# Patient Record
Sex: Female | Born: 1955
Health system: Southern US, Community
[De-identification: ages and names within clinical notes are randomized; demographics above are authoritative.]

## PROBLEM LIST (undated history)

## (undated) DIAGNOSIS — R7303 Prediabetes: Secondary | ICD-10-CM

## (undated) DIAGNOSIS — R112 Nausea with vomiting, unspecified: Secondary | ICD-10-CM

## (undated) DIAGNOSIS — T4145XA Adverse effect of unspecified anesthetic, initial encounter: Secondary | ICD-10-CM

## (undated) DIAGNOSIS — N6019 Diffuse cystic mastopathy of unspecified breast: Secondary | ICD-10-CM

## (undated) DIAGNOSIS — Z87442 Personal history of urinary calculi: Secondary | ICD-10-CM

## (undated) DIAGNOSIS — N200 Calculus of kidney: Secondary | ICD-10-CM

## (undated) DIAGNOSIS — Z86711 Personal history of pulmonary embolism: Secondary | ICD-10-CM

## (undated) DIAGNOSIS — T8859XA Other complications of anesthesia, initial encounter: Secondary | ICD-10-CM

## (undated) DIAGNOSIS — N83209 Unspecified ovarian cyst, unspecified side: Secondary | ICD-10-CM

## (undated) DIAGNOSIS — Z9889 Other specified postprocedural states: Secondary | ICD-10-CM

## (undated) DIAGNOSIS — I10 Essential (primary) hypertension: Secondary | ICD-10-CM

## (undated) HISTORY — PX: BREAST EXCISIONAL BIOPSY: SUR124

## (undated) HISTORY — DX: Calculus of kidney: N20.0

## (undated) HISTORY — DX: Diffuse cystic mastopathy of unspecified breast: N60.19

## (undated) HISTORY — DX: Unspecified ovarian cyst, unspecified side: N83.209

## (undated) HISTORY — PX: BREAST BIOPSY: SHX20

## (undated) HISTORY — PX: ABDOMINAL HYSTERECTOMY: SHX81

## (undated) HISTORY — DX: Personal history of pulmonary embolism: Z86.711

---
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
## 2005-02-24 ENCOUNTER — Ambulatory Visit: Payer: Self-pay

## 2005-03-09 ENCOUNTER — Ambulatory Visit: Payer: Self-pay

## 2007-01-13 ENCOUNTER — Ambulatory Visit: Payer: Self-pay | Admitting: Internal Medicine

## 2007-01-17 ENCOUNTER — Ambulatory Visit: Payer: Self-pay | Admitting: Internal Medicine

## 2007-04-24 LAB — HM DEXA SCAN: HM Dexa Scan: NORMAL

## 2007-05-18 HISTORY — PX: TOTAL ABDOMINAL HYSTERECTOMY W/ BILATERAL SALPINGOOPHORECTOMY: SHX83

## 2007-12-22 ENCOUNTER — Ambulatory Visit: Payer: Self-pay | Admitting: Internal Medicine

## 2008-01-16 ENCOUNTER — Ambulatory Visit: Payer: Self-pay | Admitting: Internal Medicine

## 2008-03-04 ENCOUNTER — Ambulatory Visit: Payer: Self-pay

## 2008-03-07 ENCOUNTER — Inpatient Hospital Stay: Payer: Self-pay

## 2008-03-17 DIAGNOSIS — I2699 Other pulmonary embolism without acute cor pulmonale: Secondary | ICD-10-CM

## 2008-03-17 DIAGNOSIS — Z86711 Personal history of pulmonary embolism: Secondary | ICD-10-CM

## 2008-03-17 HISTORY — DX: Personal history of pulmonary embolism: Z86.711

## 2008-03-17 HISTORY — DX: Other pulmonary embolism without acute cor pulmonale: I26.99

## 2008-03-28 ENCOUNTER — Inpatient Hospital Stay: Payer: Self-pay | Admitting: Internal Medicine

## 2008-04-04 ENCOUNTER — Ambulatory Visit: Payer: Self-pay | Admitting: Internal Medicine

## 2008-04-10 ENCOUNTER — Ambulatory Visit: Payer: Self-pay | Admitting: Internal Medicine

## 2008-04-24 ENCOUNTER — Ambulatory Visit: Payer: Self-pay | Admitting: Internal Medicine

## 2008-05-28 ENCOUNTER — Ambulatory Visit: Payer: Self-pay | Admitting: Internal Medicine

## 2008-06-11 ENCOUNTER — Ambulatory Visit: Payer: Self-pay | Admitting: Internal Medicine

## 2008-07-05 ENCOUNTER — Ambulatory Visit: Payer: Self-pay | Admitting: Internal Medicine

## 2008-09-12 ENCOUNTER — Ambulatory Visit: Payer: Self-pay | Admitting: Internal Medicine

## 2008-10-21 ENCOUNTER — Ambulatory Visit: Payer: Self-pay | Admitting: Internal Medicine

## 2008-11-01 ENCOUNTER — Ambulatory Visit: Payer: Self-pay | Admitting: Internal Medicine

## 2008-11-22 ENCOUNTER — Ambulatory Visit: Payer: Self-pay | Admitting: Internal Medicine

## 2009-03-12 ENCOUNTER — Ambulatory Visit: Payer: Self-pay

## 2009-04-03 ENCOUNTER — Ambulatory Visit: Payer: Self-pay | Admitting: Gastroenterology

## 2009-04-23 LAB — HM COLONOSCOPY: HM Colonoscopy: NORMAL

## 2010-03-17 ENCOUNTER — Ambulatory Visit: Payer: Self-pay | Admitting: Internal Medicine

## 2010-03-17 ENCOUNTER — Other Ambulatory Visit: Payer: Self-pay | Admitting: Internal Medicine

## 2010-03-26 ENCOUNTER — Other Ambulatory Visit: Payer: Self-pay | Admitting: Internal Medicine

## 2011-04-05 ENCOUNTER — Ambulatory Visit (INDEPENDENT_AMBULATORY_CARE_PROVIDER_SITE_OTHER): Payer: PRIVATE HEALTH INSURANCE | Admitting: Internal Medicine

## 2011-04-05 ENCOUNTER — Encounter: Payer: Self-pay | Admitting: Internal Medicine

## 2011-04-05 VITALS — BP 110/70 | HR 79 | Temp 98.3°F | Resp 16 | Ht 66.0 in | Wt 191.5 lb

## 2011-04-05 DIAGNOSIS — E663 Overweight: Secondary | ICD-10-CM

## 2011-04-05 DIAGNOSIS — E1169 Type 2 diabetes mellitus with other specified complication: Secondary | ICD-10-CM | POA: Insufficient documentation

## 2011-04-05 DIAGNOSIS — K635 Polyp of colon: Secondary | ICD-10-CM | POA: Insufficient documentation

## 2011-04-05 DIAGNOSIS — R7309 Other abnormal glucose: Secondary | ICD-10-CM

## 2011-04-05 DIAGNOSIS — Z1211 Encounter for screening for malignant neoplasm of colon: Secondary | ICD-10-CM | POA: Insufficient documentation

## 2011-04-05 DIAGNOSIS — Z86711 Personal history of pulmonary embolism: Secondary | ICD-10-CM | POA: Insufficient documentation

## 2011-04-05 DIAGNOSIS — E785 Hyperlipidemia, unspecified: Secondary | ICD-10-CM | POA: Insufficient documentation

## 2011-04-05 DIAGNOSIS — Z1239 Encounter for other screening for malignant neoplasm of breast: Secondary | ICD-10-CM

## 2011-04-05 DIAGNOSIS — E119 Type 2 diabetes mellitus without complications: Secondary | ICD-10-CM | POA: Insufficient documentation

## 2011-04-05 DIAGNOSIS — Z6825 Body mass index (BMI) 25.0-25.9, adult: Secondary | ICD-10-CM

## 2011-04-05 NOTE — Assessment & Plan Note (Signed)
Up to date, last scope 2010 by  Dr. Bluford Kaufmann

## 2011-04-05 NOTE — Assessment & Plan Note (Signed)
hgba1c was 6.1 with no priro history of abnormal  fasting glucose.  Spent 10 minutes discussing diagnosis of diabetes, prediabetes, etc.  recom dietary changes, continue exercise and check postprandial cbgs .

## 2011-04-05 NOTE — Assessment & Plan Note (Signed)
Breast exam done today, mammogram ordered

## 2011-04-05 NOTE — Patient Instructions (Signed)
Have the RN check your blood sugar 2 hours after lunch or breakfast ;  Goal is bs < 140  Try using the EAS Carb control protein shakes or the Atkins low carb protein bars for snacks  Cut out the fruit juices and regular sodas.   If you cholesterol is still elevated in 3 monhts we should consider medicatio to lower your LDL to < 100.  You will need repeat labs in 3 months.

## 2011-04-05 NOTE — Assessment & Plan Note (Signed)
LDL was 151 recentoly,  With hgba1c of 6.1 suggesting prediabetes.  Repeat in 6 months,  Goal < 100.

## 2011-04-05 NOTE — Progress Notes (Signed)
  Subjective:    Patient ID: Mary Chen, female    DOB: [birth year], 55 y.o.   MRN: [id number]  HPI    Review of Systems     Objective:   Physical Exam  Constitutional: She is oriented to person, place, and time. She appears well-developed and well-nourished.  HENT:  Mouth/Throat: Oropharynx is clear and moist.  Eyes: EOM are normal. Pupils are equal, round, and reactive to light. No scleral icterus.  Neck: Normal range of motion. Neck supple. No JVD present. No thyromegaly present.  Cardiovascular: Normal rate, regular rhythm, normal heart sounds and intact distal pulses.   Pulmonary/Chest: Effort normal and breath sounds normal.  Abdominal: Soft. Bowel sounds are normal. She exhibits no mass. There is no tenderness.  Genitourinary: Vagina normal. Pelvic exam was performed with patient supine. No vaginal discharge found.  Musculoskeletal: Normal range of motion. She exhibits no edema.  Lymphadenopathy:    She has no cervical adenopathy.  Neurological: She is alert and oriented to person, place, and time.  Skin: Skin is warm and dry.  Psychiatric: She has a normal mood and affect.          Assessment & Plan:

## 2011-04-06 ENCOUNTER — Ambulatory Visit: Payer: Self-pay | Admitting: Internal Medicine

## 2011-04-22 ENCOUNTER — Encounter: Payer: Self-pay | Admitting: Internal Medicine

## 2012-02-21 ENCOUNTER — Telehealth: Payer: Self-pay | Admitting: Internal Medicine

## 2012-02-21 DIAGNOSIS — Z1239 Encounter for other screening for malignant neoplasm of breast: Secondary | ICD-10-CM

## 2012-02-21 NOTE — Telephone Encounter (Signed)
Pt called needs mammogram order [place]

## 2012-02-28 NOTE — Telephone Encounter (Signed)
please check chart, it is already in there

## 2012-02-29 NOTE — Telephone Encounter (Signed)
Order faxed to [facility]

## 2012-05-08 ENCOUNTER — Ambulatory Visit: Payer: Self-pay | Admitting: Internal Medicine

## 2012-05-10 ENCOUNTER — Encounter: Payer: Self-pay | Admitting: Internal Medicine

## 2012-06-20 LAB — LIPID PANEL
LDL Cholesterol: 90 mg/dL
LDL Cholesterol: 97 mg/dL
Triglycerides: 48 mg/dL (ref 40–160)

## 2012-06-20 LAB — CBC AND DIFFERENTIAL: WBC: 4.9 10^3/mL

## 2012-06-21 ENCOUNTER — Encounter: Payer: Self-pay | Admitting: Internal Medicine

## 2012-06-21 ENCOUNTER — Ambulatory Visit (INDEPENDENT_AMBULATORY_CARE_PROVIDER_SITE_OTHER): Payer: 59 | Admitting: Internal Medicine

## 2012-06-21 ENCOUNTER — Other Ambulatory Visit: Payer: Self-pay | Admitting: Internal Medicine

## 2012-06-21 VITALS — BP 110/70 | HR 77 | Temp 97.7°F | Resp 16 | Ht 67.0 in | Wt 183.2 lb

## 2012-06-21 DIAGNOSIS — Z1239 Encounter for other screening for malignant neoplasm of breast: Secondary | ICD-10-CM

## 2012-06-21 DIAGNOSIS — Z1211 Encounter for screening for malignant neoplasm of colon: Secondary | ICD-10-CM

## 2012-06-21 DIAGNOSIS — Z0000 Encounter for general adult medical examination without abnormal findings: Secondary | ICD-10-CM | POA: Insufficient documentation

## 2012-06-21 DIAGNOSIS — Z0001 Encounter for general adult medical examination with abnormal findings: Secondary | ICD-10-CM | POA: Insufficient documentation

## 2012-06-21 LAB — COMPREHENSIVE METABOLIC PANEL
Anion Gap: 6 — ABNORMAL LOW (ref 7–16)
BUN: 16 mg/dL (ref 7–18)
Bilirubin,Total: 0.9 mg/dL (ref 0.2–1.0)
Chloride: 110 mmol/L — ABNORMAL HIGH (ref 98–107)
EGFR (African American): >60
EGFR (Non-African Amer.): >60
Glucose: 85 mg/dL (ref 65–99)
Osmolality: 284 (ref 275–301)
SGOT(AST): 21 U/L (ref 15–37)
SGPT (ALT): 22 U/L (ref 12–78)
Total Protein: 7.2 g/dL (ref 6.4–8.2)

## 2012-06-21 LAB — CBC WITH DIFFERENTIAL/PLATELET
Basophil #: 0.1 10*3/uL (ref 0.0–0.1)
Eosinophil #: 0 10*3/uL (ref 0.0–0.7)
Lymphocyte #: 2.5 10*3/uL (ref 1.0–3.6)
MCHC: 31.9 g/dL — ABNORMAL LOW (ref 32.0–36.0)
MCV: 71 fL — ABNORMAL LOW (ref 80–100)
Monocyte #: 0.4 x10 3/mm (ref 0.2–0.9)
Monocyte %: 8.7 %
Neutrophil %: 39.2 %
RBC: 5.71 10*6/uL — ABNORMAL HIGH (ref 3.80–5.20)

## 2012-06-21 LAB — LIPID PANEL
HDL Cholesterol: 73 mg/dL — ABNORMAL HIGH (ref 40–60)
Ldl Cholesterol, Calc: 97 mg/dL (ref 0–100)
Triglycerides: 48 mg/dL (ref 0–200)
VLDL Cholesterol, Calc: 10 mg/dL (ref 5–40)

## 2012-06-21 LAB — TSH: Thyroid Stimulating Horm: 0.926 u[IU]/mL

## 2012-06-21 NOTE — Progress Notes (Addendum)
Patient ID: Genese Quebedeaux, female   DOB: Jan 27, 1956, 57 y.o.   MRN: [id number]  Subjective:     Shernita Rabinovich is a 57 y.o. female and is here for a comprehensive physical exam. The patient reports no problems.  Home safety : The patient has smoke detectors in the home. They wear seatbelts.  There are no firearms at home. There is no violence in the home.  There is no risks for hepatitis, STDs or HIV. There is no   history of blood transfusion. They have no travel history to infectious disease endemic areas of the world. The patient has seen their dentist in the last six month. They have seen their eye doctor in the last year. They admit to slight hearing difficulty with regard to whispered voices and some television programs.  They have deferred audiologic testing in the last year.  They do not  have excessive sun exposure. Discussed the need for sun protection: hats, long sleeves and use of sunscreen if there is significant sun exposure.  Diet: the importance of a healthy diet is discussed. They do have a healthy diet. The benefits of regular aerobic exercise were discussed. She walks 4 times per week ,  60 minutes.  Depression screen: there are no signs or vegative symptoms of depression- irritability, change in appetite, anhedonia, sadness/tearfullness. Cognitive assessment: the patient manages all their financial and personal affairs and is actively engaged. They could relate day,date,year and events; recalled 2/3 objects at 3 minutes; performed clock-face test normally.  The following portions of the patient's history were reviewed and updated as appropriate: allergies, current medications, past family history, past medical history,  past surgical history, past social history  and problem list.  Visual acuity was not assessed per patient preference since she has regular follow up with her ophthalmologist. Hearing and body mass index were assessed and reviewed.   During the course of the  visit the patient was educated and counseled about appropriate screening and preventive services including : fall prevention , diabetes screening, nutrition counseling, colorectal cancer screening, and recommended immunizations.    History   Social History  . Marital Status: Divorced    Spouse Name: N/A    Number of Children: N/A  . Years of Education: N/A   Occupational History  .      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 Smoking status: Never Smoker   . Smokeless tobacco: Never Used  . Alcohol Use: No  . Drug Use: No  . Sexually Active: Not on file   Other Topics Concern  . Not on file   Social History Narrative  . No narrative on file   Health Maintenance  Topic Date Due  . Pap Smear  03/15/1973  . Tetanus/tdap  03/15/1974  . Colonoscopy  03/15/2005  . Influenza Vaccine  01/16/2012  . Mammogram  05/08/2014    The following portions of the patient's history were reviewed and updated as appropriate: allergies, current medications, past family history, past medical history, past social history, past surgical history and problem list.  Review of Systems A comprehensive review of systems was negative.   Objective:   BP 110/70  Pulse 77  Temp 97.7 F (36.5 C) (Oral)  Resp 16  Ht 5\' 7"  (1.702 m)  Wt 183 lb 4 oz (83.122 kg)  BMI 28.70 kg/m2  SpO2 95%  General Appearance:    Alert, cooperative, no distress, appears stated age  Head:    Normocephalic, without  obvious abnormality, atraumatic  Eyes:    PERRL, conjunctiva/corneas clear, EOM's intact, fundi    benign, both eyes  Ears:    Normal TM's and external ear canals, both ears  Nose:   Nares normal, septum midline, mucosa normal, no drainage    or sinus tenderness  Throat:   Lips, mucosa, and tongue normal; teeth and gums normal  Neck:   Supple, symmetrical, trachea midline, no adenopathy;    thyroid:  no enlargement/tenderness/nodules; no carotid   bruit or JVD  Back:     Symmetric, no  curvature, ROM normal, no CVA tenderness  Lungs:     Clear to auscultation bilaterally, respirations unlabored  Chest Wall:    No tenderness or deformity   Heart:    Regular rate and rhythm, S1 and S2 normal, no murmur, rub   or gallop  Breast Exam:    No tenderness, masses, or nipple abnormality  Abdomen:     Soft, non-tender, bowel sounds active all four quadrants,    no masses, no organomegaly  Genitalia:    Uterus surgically absent. No masses or adnexal tenderness  Rectal:    Normal tone, normal prostate, no masses or tenderness;   guaiac negative stool  Extremities:   Extremities normal, atraumatic, no cyanosis or edema  Pulses:   2+ and symmetric all extremities  Skin:   Skin color, texture, turgor normal, no rashes or lesions  Lymph nodes:   Cervical, supraclavicular, and axillary nodes normal  Neurologic:   CNII-XII intact, normal strength, sensation and reflexes    throughout        Assessment:   Routine general medical examination at a health care facility Annual exam including breast and pelvic exam were done today.   Screening for colon cancer 2010 colonoscopy by Dr Bluford Kaufmann.   Screening for breast cancer Normal breast exam.  Mammograms up to date.    Updated Medication List No outpatient encounter prescriptions on file as of 06/21/2012.

## 2012-06-21 NOTE — Assessment & Plan Note (Signed)
Normal breast exam.  Mammograms up to date.

## 2012-06-21 NOTE — Patient Instructions (Addendum)
You are in fantastic shape!  Keep up the good work!  I am sending you to [facility] for your fasting labs

## 2012-06-21 NOTE — Assessment & Plan Note (Signed)
2010 colonoscopy by Dr Bluford Kaufmann.

## 2012-06-21 NOTE — Assessment & Plan Note (Signed)
Annual exam including breast and pelvic exam were done today.

## 2012-06-23 ENCOUNTER — Telehealth: Payer: Self-pay | Admitting: Internal Medicine

## 2012-06-23 ENCOUNTER — Encounter: Payer: Self-pay | Admitting: Internal Medicine

## 2012-06-23 DIAGNOSIS — R718 Other abnormality of red blood cells: Secondary | ICD-10-CM | POA: Insufficient documentation

## 2012-06-23 NOTE — Telephone Encounter (Signed)
Pt.notified

## 2012-06-23 NOTE — Telephone Encounter (Signed)
All labs normal,. Including thyroid function and cholesterol . total chol is 180,  HDL is 73 (excellent) and LDL 97 (excellent)  She is not anemic . But one of her hemoglobin tests suggests she may have iron deficiency or another condition that cuases very small red blood cells, called thalassemia.  I do not have any prior workup for that on record, so if she doesn't mind getting a couple of additional blood tests, I will fax over an order to the lab for iron studies

## 2012-07-10 ENCOUNTER — Encounter: Payer: Self-pay | Admitting: Internal Medicine

## 2013-04-23 ENCOUNTER — Ambulatory Visit (INDEPENDENT_AMBULATORY_CARE_PROVIDER_SITE_OTHER): Payer: 59 | Admitting: Internal Medicine

## 2013-04-23 ENCOUNTER — Encounter: Payer: Self-pay | Admitting: Internal Medicine

## 2013-04-23 VITALS — BP 122/70 | HR 71 | Temp 98.3°F | Resp 12 | Ht 66.5 in | Wt 186.8 lb

## 2013-04-23 DIAGNOSIS — Z0000 Encounter for general adult medical examination without abnormal findings: Secondary | ICD-10-CM

## 2013-04-23 DIAGNOSIS — Z9079 Acquired absence of other genital organ(s): Secondary | ICD-10-CM

## 2013-04-23 DIAGNOSIS — Z1239 Encounter for other screening for malignant neoplasm of breast: Secondary | ICD-10-CM

## 2013-04-23 DIAGNOSIS — E785 Hyperlipidemia, unspecified: Secondary | ICD-10-CM

## 2013-04-23 DIAGNOSIS — Z90710 Acquired absence of both cervix and uterus: Secondary | ICD-10-CM

## 2013-04-23 DIAGNOSIS — R718 Other abnormality of red blood cells: Secondary | ICD-10-CM

## 2013-04-23 LAB — COMPREHENSIVE METABOLIC PANEL
AST: 18 U/L (ref 0–37)
BUN: 18 mg/dL (ref 6–23)
Calcium: 9.4 mg/dL (ref 8.4–10.5)
Chloride: 106 mEq/L (ref 96–112)
Creatinine, Ser: 0.8 mg/dL (ref 0.4–1.2)
Glucose, Bld: 83 mg/dL (ref 70–99)

## 2013-04-23 LAB — CBC WITH DIFFERENTIAL/PLATELET
Basophils Relative: 0.5 % (ref 0.0–3.0)
Eosinophils Absolute: 0 10*3/uL (ref 0.0–0.7)
Eosinophils Relative: 0.8 % (ref 0.0–5.0)
Hemoglobin: 12.9 g/dL (ref 12.0–15.0)
Lymphocytes Relative: 44.2 % (ref 12.0–46.0)
MCHC: 31.6 g/dL (ref 30.0–36.0)
MCV: 70.8 fl — ABNORMAL LOW (ref 78.0–100.0)
Monocytes Absolute: 0.4 10*3/uL (ref 0.1–1.0)
Neutro Abs: 2.1 10*3/uL (ref 1.4–7.7)
RBC: 5.76 Mil/uL — ABNORMAL HIGH (ref 3.87–5.11)

## 2013-04-23 NOTE — Patient Instructions (Signed)
You had your annual  wellness exam today  We will schedule your mammogram after Dec 23rd at [facility],  And your DEXA scan soon.  Please use the stool kit to send [place] back a sample to test for blood.  This is your colon CA screening test.   You need to have a TDaP vaccine.  Please check with Employee Health to see if it is free at work.  We will contact you with the bloodwork results

## 2013-04-23 NOTE — Assessment & Plan Note (Signed)
Low risk given elevated HDL of 73 and LDL 97.

## 2013-04-23 NOTE — Assessment & Plan Note (Signed)
Annual comprehensive exam was done including breast, excluding pelvic and PAP smear. All screenings have been addressed .  

## 2013-04-23 NOTE — Progress Notes (Signed)
Patient ID: Mary Chen, female   DOB: 05/19/1955, 57 y.o.   MRN: [id number]   Subjective:     Mary Chen is a 57 y.o. female and is here for a comprehensive physical exam. The patient reports no problems.  History   Social History  . Marital Status: Divorced    Spouse Name: N/A    Number of Children: N/A  . Years of Education: N/A   Occupational History  .      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 Smoking status: Never Smoker   . Smokeless tobacco: Never Used  . Alcohol Use: No  . Drug Use: No  . Sexual Activity: Not on file   Other Topics Concern  . Not on file   Social History Narrative  . No narrative on file   Health Maintenance  Topic Date Due  . Pap Smear  03/15/1973  . Tetanus/tdap  03/15/1974  . Colonoscopy  03/15/2005  . Influenza Vaccine  12/15/2012  . Mammogram  05/08/2014    The following portions of the patient's history were reviewed and updated as appropriate: allergies, current medications, past family history, past medical history, past social history, past surgical history and problem list.  Review of Systems A comprehensive review of systems was negative.   Objective:  BP 122/70  Pulse 71  Temp(Src) 98.3 F (36.8 C) (Oral)  Resp 12  Ht 5' 6.5" (1.689 m)  Wt 186 lb 12 oz (84.709 kg)  BMI 29.69 kg/m2  SpO2 97%  General Appearance:    Alert, cooperative, no distress, appears stated age  Head:    Normocephalic, without obvious abnormality, atraumatic  Eyes:    PERRL, conjunctiva/corneas clear, EOM's intact, fundi    benign, both eyes  Ears:    Normal TM's and external ear canals, both ears  Nose:   Nares normal, septum midline, mucosa normal, no drainage    or sinus tenderness  Throat:   Lips, mucosa, and tongue normal; teeth and gums normal  Neck:   Supple, symmetrical, trachea midline, no adenopathy;    thyroid:  no enlargement/tenderness/nodules; no carotid   bruit or JVD  Back:     Symmetric, no curvature,  ROM normal, no CVA tenderness  Lungs:     Clear to auscultation bilaterally, respirations unlabored  Chest Wall:    No tenderness or deformity   Heart:    Regular rate and rhythm, S1 and S2 normal, no murmur, rub   or gallop  Breast Exam:    No tenderness, masses, or nipple abnormality  Abdomen:     Soft, non-tender, bowel sounds active all four quadrants,    no masses, no organomegaly     Extremities:   Extremities normal, atraumatic, no cyanosis or edema  Pulses:   2+ and symmetric all extremities  Skin:   Skin color, texture, turgor normal, no rashes or lesions  Lymph nodes:   Cervical, supraclavicular, and axillary nodes normal  Neurologic:   CNII-XII intact, normal strength, sensation and reflexes    throughout    Assessment and Plan:  Routine general medical examination at a health care facility Annual comprehensive exam was done including breast, excluding  pelvic and PAP smear. All screenings have been addressed .   Microcytosis Checking iron , hgb electrophoresis and peripheral blood smear    Hyperlipidemia LDL goal < 100 Low risk given elevated HDL of 73 and LDL 97.    Updated Medication List No outpatient encounter prescriptions on  file as of 04/23/2013.

## 2013-04-23 NOTE — Assessment & Plan Note (Signed)
Checking iron , hgb electrophoresis and peripheral blood smear

## 2013-04-25 LAB — PROTEIN ELECTROPHORESIS, SERUM
Beta 2: 5.6 % (ref 3.2–6.5)
Gamma Globulin: 14.1 % (ref 11.1–18.8)

## 2013-05-02 ENCOUNTER — Encounter: Payer: Self-pay | Admitting: Emergency Medicine

## 2013-05-15 ENCOUNTER — Ambulatory Visit: Payer: Self-pay | Admitting: Internal Medicine

## 2013-05-21 ENCOUNTER — Telehealth: Payer: Self-pay | Admitting: Internal Medicine

## 2013-05-21 DIAGNOSIS — R928 Other abnormal and inconclusive findings on diagnostic imaging of breast: Secondary | ICD-10-CM

## 2013-05-21 NOTE — Telephone Encounter (Signed)
Her mammogram was abnormal on the right.  They saw an asymmetry and want additional images, which I have ordered.  I prefer to choose the surgeon with the patient if a biopsy is recommended, so if they tell her she needs a biopsy, she can tell them she is going to discuss it with me first.

## 2013-05-21 NOTE — Telephone Encounter (Signed)
Mary Chen, they will s/u added views but incase they call us about a biopsy. TT wants her to see a Careers advisersurgeon.

## 2013-05-21 NOTE — Assessment & Plan Note (Signed)
Her mammogram was abnormal on the right.  They saw an asymmetry and want additional images, which I have ordered.  I prefer to choose the surgeon with the patient if a biopsy is recommended, so if they tell her she needs a biopsy, she can tell them she is going to discuss it with me first.  

## 2013-06-18 ENCOUNTER — Encounter: Payer: Self-pay | Admitting: Internal Medicine

## 2013-06-18 ENCOUNTER — Ambulatory Visit: Payer: Self-pay | Admitting: Internal Medicine

## 2013-06-22 ENCOUNTER — Encounter: Payer: 59 | Admitting: Internal Medicine

## 2013-07-13 ENCOUNTER — Encounter: Payer: Self-pay | Admitting: Internal Medicine

## 2015-04-21 ENCOUNTER — Ambulatory Visit (INDEPENDENT_AMBULATORY_CARE_PROVIDER_SITE_OTHER): Payer: 59 | Admitting: Internal Medicine

## 2015-04-21 ENCOUNTER — Encounter: Payer: Self-pay | Admitting: Internal Medicine

## 2015-04-21 VITALS — BP 118/78 | HR 85 | Temp 98.4°F | Resp 12 | Ht 66.5 in | Wt 217.5 lb

## 2015-04-21 DIAGNOSIS — R718 Other abnormality of red blood cells: Secondary | ICD-10-CM

## 2015-04-21 DIAGNOSIS — E785 Hyperlipidemia, unspecified: Secondary | ICD-10-CM | POA: Diagnosis not present

## 2015-04-21 DIAGNOSIS — R7309 Other abnormal glucose: Secondary | ICD-10-CM | POA: Diagnosis not present

## 2015-04-21 DIAGNOSIS — Z1239 Encounter for other screening for malignant neoplasm of breast: Secondary | ICD-10-CM

## 2015-04-21 DIAGNOSIS — Z0000 Encounter for general adult medical examination without abnormal findings: Secondary | ICD-10-CM | POA: Diagnosis not present

## 2015-04-21 DIAGNOSIS — Z23 Encounter for immunization: Secondary | ICD-10-CM

## 2015-04-21 DIAGNOSIS — E559 Vitamin D deficiency, unspecified: Secondary | ICD-10-CM | POA: Diagnosis not present

## 2015-04-21 DIAGNOSIS — R635 Abnormal weight gain: Secondary | ICD-10-CM

## 2015-04-21 DIAGNOSIS — Z1159 Encounter for screening for other viral diseases: Secondary | ICD-10-CM

## 2015-04-21 NOTE — Progress Notes (Signed)
Pre-visit discussion using our clinic review tool. No additional management support is needed unless otherwise documented below in the visit note.  

## 2015-04-21 NOTE — Progress Notes (Addendum)
Patient ID: Mary Chen, female    DOB: July 06, 1955  Age: 59 y.o. MRN: [id number]  The patient is here for annual physical examination and management of other chronic and acute problems.  TAH/BSO 2014 No mammogram since Feb 2015  [facility]   Colonoscopy normal 2010    The risk factors are reflected in the social history.  The roster of all physicians providing medical care to patient - is listed in the Snapshot section of the chart.  Activities of daily living:  The patient is 100% independent in all ADLs: dressing, toileting, feeding as well as independent mobility  Home safety : The patient has smoke detectors in the home. They wear seatbelts.  There are no firearms at home. There is no violence in the home.   There is no risks for hepatitis, STDs or HIV. There is no   history of blood transfusion. They have no travel history to infectious disease endemic areas of the world.  The patient has seen their dentist in the last six month. They have seen their eye doctor in the last year. They admit to slight hearing difficulty with regard to whispered voices and some television programs.  They have deferred audiologic testing in the last year.  They do not  have excessive sun exposure. Discussed the need for sun protection: hats, long sleeves and use of sunscreen if there is significant sun exposure.   Diet: the importance of a healthy diet is discussed. They do have a healthy diet.  The benefits of regular aerobic exercise were discussed. She walks 4 times per week ,  20 minutes.   Depression screen: there are no signs or vegative symptoms of depression- irritability, change in appetite, anhedonia, sadness/tearfullness.  Cognitive assessment: the patient manages all their financial and personal affairs and is actively engaged. They could relate day,date,year and events; recalled 2/3 objects at 3 minutes; performed clock-face test normally.  The following portions of the  patient's history were reviewed and updated as appropriate: allergies, current medications, past family history, past medical history,  past surgical history, past social history  and problem list.  Visual acuity was not assessed per patient preference since she has regular follow up with her ophthalmologist. Hearing and body mass index were assessed and reviewed.   During the course of the visit the patient was educated and counseled about appropriate screening and preventive services including : fall prevention , diabetes screening, nutrition counseling, colorectal cancer screening, and recommended immunizations.    CC: The primary encounter diagnosis was Encounter for immunization. Diagnoses of Abnormal non-fasting glucose, Hyperlipidemia with target LDL less than 100, Microcytosis, Need for hepatitis C screening test, Screening for breast cancer, Weight gain, Vitamin D deficiency, Need for prophylactic vaccination with combined diphtheria-tetanus-pertussis (DTP) vaccine, and Visit for preventive health examination were also pertinent to this visit.  WEIGHT GAIN.  HAS NOT BEEN EXERCISING fo rhte past year due to work schedule .   History Mary Chen has a past medical history of Ovarian cyst; Fibrocystic disease of breast; pulmonary embolism (Nov 2009); and Benign colonic polyp (Nov 2010).   She has past surgical history that includes Total abdominal hysterectomy w/ bilateral salpingoophorectomy (2009) and Abdominal hysterectomy.   Her family history includes Bipolar disorder in her brother; Cancer in her mother; Cancer (age of onset: 8668) in her father.She reports that she has never smoked. She has never used smokeless tobacco. She reports that she does not drink alcohol or use illicit drugs.  No outpatient prescriptions prior to visit.   No facility-administered medications prior to visit.    Review of Systems   Patient denies headache, fevers, malaise, unintentional weight loss, skin rash,  eye pain, sinus congestion and sinus pain, sore throat, dysphagia,  hemoptysis , cough, dyspnea, wheezing, chest pain, palpitations, orthopnea, edema, abdominal pain, nausea, melena, diarrhea, constipation, flank pain, dysuria, hematuria, urinary  Frequency, nocturia, numbness, tingling, seizures,  Focal weakness, Loss of consciousness,  Tremor, insomnia, depression, anxiety, and suicidal ideation.      Objective:  BP 118/78 mmHg  Pulse 85  Temp(Src) 98.4 F (36.9 C) (Oral)  Resp 12  Ht 5' 6.5" (1.689 m)  Wt 217 lb 8 oz (98.657 kg)  BMI 34.58 kg/m2  SpO2 97%  Physical Exam   General appearance: alert, cooperative and appears stated age Head: Normocephalic, without obvious abnormality, atraumatic Eyes: conjunctivae/corneas clear. PERRL, EOM's intact. Fundi benign. Ears: normal TM's and external ear canals both ears Nose: Nares normal. Septum midline. Mucosa normal. No drainage or sinus tenderness. Throat: lips, mucosa, and tongue normal; teeth and gums normal Neck: no adenopathy, no carotid bruit, no JVD, supple, symmetrical, trachea midline and thyroid not enlarged, symmetric, no tenderness/mass/nodules Lungs: clear to auscultation bilaterally Breasts: normal appearance, no masses or tenderness Heart: regular rate and rhythm, S1, S2 normal, no murmur, click, rub or gallop Abdomen: soft, non-tender; bowel sounds normal; no masses,  no organomegaly Extremities: extremities normal, atraumatic, no cyanosis or edema Pulses: 2+ and symmetric Skin: Skin color, texture, turgor normal. No rashes or lesions Neurologic: Alert and oriented X 3, normal strength and tone. Normal symmetric reflexes. Normal coordination and gait.     Assessment & Plan:   Problem List Items Addressed This Visit    Vitamin D deficiency   Relevant Orders   VITAMIN D 25 Hydroxy (Vit-D Deficiency, Fractures) (Completed)   Hyperlipidemia with target LDL less than 100     Based on fasting cholesterol and your  concurrent history of hypertension , her 10 year risk of having some type of coronary event (including heart attack) is 6.7% .  No medication indicated at that time.          Lab Results  Component Value Date   CHOL 233* 04/21/2015   HDL 52.10 04/21/2015   LDLCALC 159* 04/21/2015   TRIG 111.0 04/21/2015   CHOLHDL 4 04/21/2015         Relevant Orders   Lipid panel (Completed)   Visit for preventive health examination    Annual comprehensive preventive exam was done as well as an evaluation and management of chronic conditions .  During the course of the visit the patient was educated and counseled about appropriate screening and preventive services including :  diabetes screening, lipid analysis with projected  10 year  risk for CAD  using the Framingham risk calculator for women, , nutrition counseling, colorectal cancer screening, and recommended immunizations.  Printed recommendations for health maintenance screenings was given.       Microcytosis    Chronic, normal SPEP.  Normal iron studies,  No anemia.   Lab Results  Component Value Date   WBC 5.8 04/21/2015   HGB 13.5 04/21/2015   HCT 43.4 04/21/2015   MCV 70.5* 04/21/2015   PLT 258.0 04/21/2015   Lab Results  Component Value Date   IRON 95 04/23/2013   TIBC 304 04/23/2013   FERRITIN 83.7 04/23/2013         Relevant Orders   CBC with Differential/Platelet (  Completed)   Screening for breast cancer   Relevant Orders   MM DIGITAL SCREENING BILATERAL   Abnormal non-fasting glucose   Relevant Orders   Comprehensive metabolic panel (Completed)   Hemoglobin A1c (Completed)    Other Visit Diagnoses    Encounter for immunization    -  Primary    Need for hepatitis C screening test        Relevant Orders    Hepatitis C antibody (Completed)    Weight gain        Relevant Orders    TSH (Completed)    Need for prophylactic vaccination with combined diphtheria-tetanus-pertussis (DTP) vaccine        Relevant Orders     Tdap vaccine greater than or equal to 7yo IM (Completed)       I am having Ms. Spoon start on ergocalciferol.  Meds ordered this encounter  Medications  . ergocalciferol (DRISDOL) 50000 UNITS capsule    Sig: Take 1 capsule (50,000 Units total) by mouth once a week.    Dispense:  12 capsule    Refill:  0    There are no discontinued medications.  Follow-up: No Follow-up on file.   Sherlene Shams, MD

## 2015-04-21 NOTE — Patient Instructions (Signed)

## 2015-04-22 LAB — CBC WITH DIFFERENTIAL/PLATELET
BASOS ABS: 0 10*3/uL (ref 0.0–0.1)
Basophils Relative: 0.4 % (ref 0.0–3.0)
EOS PCT: 1 % (ref 0.0–5.0)
Eosinophils Absolute: 0.1 10*3/uL (ref 0.0–0.7)
HEMATOCRIT: 43.4 % (ref 36.0–46.0)
HEMOGLOBIN: 13.5 g/dL (ref 12.0–15.0)
LYMPHS PCT: 46.4 % — AB (ref 12.0–46.0)
Lymphs Abs: 2.7 10*3/uL (ref 0.7–4.0)
MCHC: 31.1 g/dL (ref 30.0–36.0)
MCV: 70.5 fl — AB (ref 78.0–100.0)
MONOS PCT: 8 % (ref 3.0–12.0)
Monocytes Absolute: 0.5 10*3/uL (ref 0.1–1.0)
NEUTROS PCT: 44.2 % (ref 43.0–77.0)
Neutro Abs: 2.5 10*3/uL (ref 1.4–7.7)
Platelets: 258 10*3/uL (ref 150.0–400.0)
RBC: 6.16 Mil/uL — AB (ref 3.87–5.11)
RDW: 16.9 % — ABNORMAL HIGH (ref 11.5–15.5)
WBC: 5.8 10*3/uL (ref 4.0–10.5)

## 2015-04-22 LAB — COMPREHENSIVE METABOLIC PANEL
ALBUMIN: 4.1 g/dL (ref 3.5–5.2)
ALK PHOS: 84 U/L (ref 39–117)
ALT: 19 U/L (ref 0–35)
AST: 16 U/L (ref 0–37)
BILIRUBIN TOTAL: 1 mg/dL (ref 0.2–1.2)
BUN: 15 mg/dL (ref 6–23)
CO2: 26 mEq/L (ref 19–32)
CREATININE: 0.8 mg/dL (ref 0.40–1.20)
Calcium: 9.6 mg/dL (ref 8.4–10.5)
Chloride: 106 mEq/L (ref 96–112)
GFR: 94.06 mL/min (ref >60.00)
Glucose, Bld: 78 mg/dL (ref 70–99)
POTASSIUM: 4.6 meq/L (ref 3.5–5.1)
SODIUM: 143 meq/L (ref 135–145)
TOTAL PROTEIN: 7.2 g/dL (ref 6.0–8.3)

## 2015-04-22 LAB — HEMOGLOBIN A1C: Hgb A1c MFr Bld: 6.2 % (ref 4.6–6.5)

## 2015-04-22 LAB — HEPATITIS C ANTIBODY: HCV Ab: NEGATIVE

## 2015-04-22 LAB — LIPID PANEL
CHOL/HDL RATIO: 4
Cholesterol: 233 mg/dL — ABNORMAL HIGH (ref 0–200)
HDL: 52.1 mg/dL (ref >39.00)
LDL CALC: 159 mg/dL — AB (ref 0–99)
NONHDL: 181.13
Triglycerides: 111 mg/dL (ref 0.0–149.0)
VLDL: 22.2 mg/dL (ref 0.0–40.0)

## 2015-04-22 LAB — VITAMIN D 25 HYDROXY (VIT D DEFICIENCY, FRACTURES): VITD: 9.02 ng/mL — AB (ref 30.00–100.00)

## 2015-04-22 LAB — TSH: TSH: 1.03 u[IU]/mL (ref 0.35–4.50)

## 2015-04-22 NOTE — Assessment & Plan Note (Addendum)
Chronic, normal SPEP.  Normal iron studies,  No anemia.   Lab Results  Component Value Date   WBC 5.8 04/21/2015   HGB 13.5 04/21/2015   HCT 43.4 04/21/2015   MCV 70.5* 04/21/2015   PLT 258.0 04/21/2015   Lab Results  Component Value Date   IRON 95 04/23/2013   TIBC 304 04/23/2013   FERRITIN 83.7 04/23/2013

## 2015-04-22 NOTE — Assessment & Plan Note (Signed)
  Based on fasting cholesterol and your concurrent history of hypertension , her 10 year risk of having some type of coronary event (including heart attack) is 6.7% .  No medication indicated at that time.          Lab Results  Component Value Date   CHOL 233* 04/21/2015   HDL 52.10 04/21/2015   LDLCALC 159* 04/21/2015   TRIG 111.0 04/21/2015   CHOLHDL 4 04/21/2015

## 2015-04-22 NOTE — Assessment & Plan Note (Signed)

## 2015-04-24 DIAGNOSIS — E559 Vitamin D deficiency, unspecified: Secondary | ICD-10-CM | POA: Insufficient documentation

## 2015-04-24 MED ORDER — ERGOCALCIFEROL 1.25 MG (50000 UT) PO CAPS: 50000.0000 [IU] | ORAL_CAPSULE | ORAL | Status: DC

## 2015-04-24 NOTE — Addendum Note (Signed)
Addended by: Sherlene ShamsULLO, Jesaiah Fabiano L on: 04/24/2015 04:31 PM   Modules accepted: Orders, SmartSet

## 2015-04-25 ENCOUNTER — Encounter: Payer: Self-pay | Admitting: *Deleted

## 2015-07-16 ENCOUNTER — Encounter: Payer: Self-pay | Admitting: Physician Assistant

## 2015-07-16 ENCOUNTER — Ambulatory Visit: Payer: Self-pay | Admitting: Family

## 2015-07-16 VITALS — BP 140/80 | HR 80 | Temp 98.5°F

## 2015-07-16 DIAGNOSIS — Z299 Encounter for prophylactic measures, unspecified: Secondary | ICD-10-CM

## 2015-07-16 NOTE — Progress Notes (Signed)
See note in paper chart by Heather Ratcliffe, PAC  

## 2015-08-12 ENCOUNTER — Ambulatory Visit: Payer: Self-pay | Admitting: Physician Assistant

## 2015-08-12 ENCOUNTER — Encounter: Payer: Self-pay | Admitting: Physician Assistant

## 2015-08-12 VITALS — BP 120/84 | Temp 97.8°F

## 2015-08-12 DIAGNOSIS — E559 Vitamin D deficiency, unspecified: Secondary | ICD-10-CM

## 2015-08-12 MED ORDER — ERGOCALCIFEROL 1.25 MG (50000 UT) PO CAPS: 50000.0000 [IU] | ORAL_CAPSULE | ORAL | Status: DC

## 2015-08-12 NOTE — Progress Notes (Signed)
S: pt here for med refill on vit d 50,000u, regular md had rx'd it bc her levels were low, no complaints about medication, is not able to get much sun during the week, remainder ros neg  O: vitals wnl, nad, lungs c t a, cv rrr  A: vit d deficiency  P: refill on vit d, pt will f/u with pcp for further eval, ?if just needs otc at this point or needs to stay on rx strength, pt will have levels checked with pcp as vitamin d was 9 on labs from 04/21/15

## 2016-04-21 ENCOUNTER — Ambulatory Visit (INDEPENDENT_AMBULATORY_CARE_PROVIDER_SITE_OTHER): Payer: 59 | Admitting: Internal Medicine

## 2016-04-21 VITALS — BP 108/74 | HR 71 | Temp 98.2°F | Resp 12 | Ht 66.0 in | Wt 220.8 lb

## 2016-04-21 DIAGNOSIS — R7303 Prediabetes: Secondary | ICD-10-CM | POA: Diagnosis not present

## 2016-04-21 DIAGNOSIS — E785 Hyperlipidemia, unspecified: Secondary | ICD-10-CM | POA: Diagnosis not present

## 2016-04-21 DIAGNOSIS — E6609 Other obesity due to excess calories: Secondary | ICD-10-CM

## 2016-04-21 DIAGNOSIS — Z0000 Encounter for general adult medical examination without abnormal findings: Secondary | ICD-10-CM

## 2016-04-21 DIAGNOSIS — E559 Vitamin D deficiency, unspecified: Secondary | ICD-10-CM

## 2016-04-21 DIAGNOSIS — R5383 Other fatigue: Secondary | ICD-10-CM | POA: Diagnosis not present

## 2016-04-21 DIAGNOSIS — Z6831 Body mass index (BMI) 31.0-31.9, adult: Secondary | ICD-10-CM

## 2016-04-21 DIAGNOSIS — Z1231 Encounter for screening mammogram for malignant neoplasm of breast: Secondary | ICD-10-CM

## 2016-04-21 DIAGNOSIS — R7309 Other abnormal glucose: Secondary | ICD-10-CM

## 2016-04-21 DIAGNOSIS — E66811 Obesity, class 1: Secondary | ICD-10-CM

## 2016-04-21 DIAGNOSIS — Z1239 Encounter for other screening for malignant neoplasm of breast: Secondary | ICD-10-CM

## 2016-04-21 NOTE — Progress Notes (Signed)
Pre-visit discussion using our clinic review tool. No additional management support is needed unless otherwise documented below in the visit note.  

## 2016-04-21 NOTE — Patient Instructions (Signed)
The  diet I discussed with you today is the 10 day Green Smoothie Cleansing /Detox Diet by Linden Dolin . available on [employer] for around $10.  It does require a blender, (Vita Mix, a electric juicer,  Or a Nutribullet Rx).  This is not a low carb or a weight loss diet,  It is fundamentally a "cleansing" low fat diet that eliminates sugar, gluten, caffeine, alcohol and dairy for 10 days .  What you add back after the initial ten days is entirely up to  you!  You can expect to lose 5 to 10 lbs depending on how strict you are.   I found that  drinking 2 smoothies or juices  daily and keeping one chewable meal (but keep it simple, like baked fish and salad, rice or bok choy) kept me satisfied and kept me from straying  .  You snack primarily on fresh  fruit, egg whites and judicious quantities of nuts.  You can add a  vegetable based protein powder  to any smoothie made with almond milk (nothing with whey , since whey is dairy)  [employer] has a few but  the [employer] has the greatest  selection .  Using frozen fruits is much more convenient and cost effective. You can even find plenty of organic fruit in the frozen fruit section of [employer].  Just thaw what you need for the following day the night before in the refrigerator (to avoid jamming up your machine)     Health Maintenance for Postmenopausal Women Introduction Menopause is a normal process in which your reproductive ability comes to an end. This process happens gradually over a span of months to years, usually between the ages of 68 and 55. Menopause is complete when you have missed 12 consecutive menstrual periods. It is important to talk with your health care provider about some of the most common conditions that affect postmenopausal women, such as heart disease, cancer, and bone loss (osteoporosis). Adopting a healthy lifestyle and getting preventive care can help to promote your health and wellness. Those actions can also lower your chances of  developing some of these common conditions. What should I know about menopause? During menopause, you may experience a number of symptoms, such as:  Moderate-to-severe hot flashes.  Night sweats.  Decrease in sex drive.  Mood swings.  Headaches.  Tiredness.  Irritability.  Memory problems.  Insomnia. Choosing to treat or not to treat menopausal changes is an individual decision that you make with your health care provider. What should I know about hormone replacement therapy and supplements? Hormone therapy products are effective for treating symptoms that are associated with menopause, such as hot flashes and night sweats. Hormone replacement carries certain risks, especially as you become older. If you are thinking about using estrogen or estrogen with progestin treatments, discuss the benefits and risks with your health care provider. What should I know about heart disease and stroke? Heart disease, heart attack, and stroke become more likely as you age. This may be due, in part, to the hormonal changes that your body experiences during menopause. These can affect how your body processes dietary fats, triglycerides, and cholesterol. Heart attack and stroke are both medical emergencies. There are many things that you can do to help prevent heart disease and stroke:  Have your blood pressure checked at least every 1-2 years. High blood pressure causes heart disease and increases the risk of stroke.  If you are 94-31 years old, ask your health  care provider if you should take aspirin to prevent a heart attack or a stroke.  Do not use any tobacco products, including cigarettes, chewing tobacco, or electronic cigarettes. If you need help quitting, ask your health care provider.  It is important to eat a healthy diet and maintain a healthy weight.  Be sure to include plenty of vegetables, fruits, low-fat dairy products, and lean protein.  Avoid eating foods that are high in solid  fats, added sugars, or salt (sodium).  Get regular exercise. This is one of the most important things that you can do for your health.  Try to exercise for at least 150 minutes each week. The type of exercise that you do should increase your heart rate and make you sweat. This is known as moderate-intensity exercise.  Try to do strengthening exercises at least twice each week. Do these in addition to the moderate-intensity exercise.  Know your numbers.Ask your health care provider to check your cholesterol and your blood glucose. Continue to have your blood tested as directed by your health care provider. What should I know about cancer screening? There are several types of cancer. Take the following steps to reduce your risk and to catch any cancer development as early as possible. Breast Cancer  Practice breast self-awareness.  This means understanding how your breasts normally appear and feel.  It also means doing regular breast self-exams. Let your health care provider know about any changes, no matter how small.  If you are 65 or older, have a clinician do a breast exam (clinical breast exam or CBE) every year. Depending on your age, family history, and medical history, it may be recommended that you also have a yearly breast X-ray (mammogram).  If you have a family history of breast cancer, talk with your health care provider about genetic screening.  If you are at high risk for breast cancer, talk with your health care provider about having an MRI and a mammogram every year.  Breast cancer (BRCA) gene test is recommended for women who have family members with BRCA-related cancers. Results of the assessment will determine the need for genetic counseling and BRCA1 and for BRCA2 testing. BRCA-related cancers include these types:  Breast. This occurs in males or females.  Ovarian.  Tubal. This may also be called fallopian tube cancer.  Cancer of the abdominal or pelvic lining  (peritoneal cancer).  Prostate.  Pancreatic. Cervical, Uterine, and Ovarian Cancer  Your health care provider may recommend that you be screened regularly for cancer of the pelvic organs. These include your ovaries, uterus, and vagina. This screening involves a pelvic exam, which includes checking for microscopic changes to the surface of your cervix (Pap test).  For women ages 21-65, health care providers may recommend a pelvic exam and a Pap test every three years. For women ages 24-65, they may recommend the Pap test and pelvic exam, combined with testing for human papilloma virus (HPV), every five years. Some types of HPV increase your risk of cervical cancer. Testing for HPV may also be done on women of any age who have unclear Pap test results.  Other health care providers may not recommend any screening for nonpregnant women who are considered low risk for pelvic cancer and have no symptoms. Ask your health care provider if a screening pelvic exam is right for you.  If you have had past treatment for cervical cancer or a condition that could lead to cancer, you need Pap tests and screening for  cancer for at least 20 years after your treatment. If Pap tests have been discontinued for you, your risk factors (such as having a new sexual partner) need to be reassessed to determine if you should start having screenings again. Some women have medical problems that increase the chance of getting cervical cancer. In these cases, your health care provider may recommend that you have screening and Pap tests more often.  If you have a family history of uterine cancer or ovarian cancer, talk with your health care provider about genetic screening.  If you have vaginal bleeding after reaching menopause, tell your health care provider.  There are currently no reliable tests available to screen for ovarian cancer. Lung Cancer  Lung cancer screening is recommended for adults 51-76 years old who are at high  risk for lung cancer because of a history of smoking. A yearly low-dose CT scan of the lungs is recommended if you:  Currently smoke.  Have a history of at least 30 pack-years of smoking and you currently smoke or have quit within the past 15 years. A pack-year is smoking an average of one pack of cigarettes per day for one year. Yearly screening should:  Continue until it has been 15 years since you quit.  Stop if you develop a health problem that would prevent you from having lung cancer treatment. Colorectal Cancer  This type of cancer can be detected and can often be prevented.  Routine colorectal cancer screening usually begins at age 36 and continues through age 75.  If you have risk factors for colon cancer, your health care provider may recommend that you be screened at an earlier age.  If you have a family history of colorectal cancer, talk with your health care provider about genetic screening.  Your health care provider may also recommend using home test kits to check for hidden blood in your stool.  A small camera at the end of a tube can be used to examine your colon directly (sigmoidoscopy or colonoscopy). This is done to check for the earliest forms of colorectal cancer.  Direct examination of the colon should be repeated every 5-10 years until age 8. However, if early forms of precancerous polyps or small growths are found or if you have a family history or genetic risk for colorectal cancer, you may need to be screened more often. Skin Cancer  Check your skin from head to toe regularly.  Monitor any moles. Be sure to tell your health care provider:  About any new moles or changes in moles, especially if there is a change in a mole's shape or color.  If you have a mole that is larger than the size of a pencil eraser.  If any of your family members has a history of skin cancer, especially at a young age, talk with your health care provider about genetic  screening.  Always use sunscreen. Apply sunscreen liberally and repeatedly throughout the day.  Whenever you are outside, protect yourself by wearing long sleeves, pants, a wide-brimmed hat, and sunglasses. What should I know about osteoporosis? Osteoporosis is a condition in which bone destruction happens more quickly than new bone creation. After menopause, you may be at an increased risk for osteoporosis. To help prevent osteoporosis or the bone fractures that can happen because of osteoporosis, the following is recommended:  If you are 19-59 years old, get at least 1,000 mg of calcium and at least 600 mg of vitamin D per day.  If you  are older than age 82 but younger than age 49, get at least 1,200 mg of calcium and at least 600 mg of vitamin D per day.  If you are older than age 20, get at least 1,200 mg of calcium and at least 800 mg of vitamin D per day. Smoking and excessive alcohol intake increase the risk of osteoporosis. Eat foods that are rich in calcium and vitamin D, and do weight-bearing exercises several times each week as directed by your health care provider. What should I know about how menopause affects my mental health? Depression may occur at any age, but it is more common as you become older. Common symptoms of depression include:  Low or sad mood.  Changes in sleep patterns.  Changes in appetite or eating patterns.  Feeling an overall lack of motivation or enjoyment of activities that you previously enjoyed.  Frequent crying spells. Talk with your health care provider if you think that you are experiencing depression. What should I know about immunizations? It is important that you get and maintain your immunizations. These include:  Tetanus, diphtheria, and pertussis (Tdap) booster vaccine.  Influenza every year before the flu season begins.  Pneumonia vaccine.  Shingles vaccine. Your health care provider may also recommend other immunizations. This  information is not intended to replace advice given to you by your health care provider. Make sure you discuss any questions you have with your health care provider. Document Released: 06/25/2005 Document Revised: 11/21/2015 Document Reviewed: 02/04/2015  2017 [employer]vier

## 2016-04-21 NOTE — Progress Notes (Signed)
Patient ID: Mary Chen, female    DOB: [birth year]  Age: 60 y.o. MRN: [id number]  The patient is here for annual PHYSICAL examination and management of other chronic and acute problems.  LAST SEEN ONE YEAR AGO The risk factors are reflected in the social history.  The roster of all physicians providing medical care to patient - is listed in the Snapshot section of the chart.   Home safety : The patient has smoke detectors in the home. They wear seatbelts.  There are no firearms at home. There is no violence in the home.   There is no risks for hepatitis, STDs or HIV. There is no   history of blood transfusion. They have no travel history to infectious disease endemic areas of the world.  The patient has seen their dentist in the last six month. They have seen their eye doctor in the last year. They admit to slight hearing difficulty with regard to whispered voices and some television programs.  They have deferred audiologic testing in the last year.  They do not  have excessive sun exposure. Discussed the need for sun protection: hats, long sleeves and use of sunscreen if there is significant sun exposure.   Diet: the importance of a healthy diet is discussed. They donot  have a healthy diet.  The benefits of regular aerobic exercise were discussed. She is not exercising s.   Depression screen: there are no signs or vegative symptoms of depression- irritability, change in appetite, anhedonia, sadness/tearfullness.  The following portions of the patient's history were reviewed and updated as appropriate: allergies, current medications, past family history, past medical history,  past surgical history, past social history  and problem list.  Visual acuity was not assessed per patient preference since she has regular follow up with her ophthalmologist. Hearing and body mass index were assessed and reviewed.   During the course of the visit the patient was educated and counseled about  appropriate screening and preventive services including : fall prevention , diabetes screening, nutrition counseling, colorectal cancer screening, and recommended immunizations.    CC: The primary encounter diagnosis was Screening for breast cancer. Diagnoses of Hyperlipidemia with target LDL less than 100, Abnormal non-fasting glucose, Vitamin D deficiency, Fatigue, unspecified type, Hyperlipidemia LDL goal <160, Prediabetes, Class 1 obesity due to excess calories without serious comorbidity with body mass index (BMI) of 31.0 to 31.9 in adult, and Visit for preventive health examination were also pertinent to this visit.   1) Diagnosed with Prediabetes last year,  No follow up since then.    2) Obesity:  Has stopped exercising regularly and has gained 40 lbs since [facility] with [facility] .  Attributes the weight gain to increased work schedule loss of work/life balance.  Barriers to exercise explored:  5 YR OLD GRANDSON FOILING HER EFFORTS TO EXERCISE BC HE LIKES TO STAY WITH HER.     3) LOW VITAMIN D OF 9.  Not taking any supplements currently  4) INGUINAL RASH,  NOT RESPONDING  TO HYDROCORTISONE     History Mary Chen has a past medical history of Benign colonic polyp (Nov 2010); Fibrocystic disease of breast; pulmonary embolism (Nov 2009); and Ovarian cyst.   She has a past surgical history that includes Total abdominal hysterectomy w/ bilateral salpingoophorectomy (2009) and Abdominal hysterectomy.   Her family history includes Bipolar disorder in her brother; Cancer in her mother; Cancer (age of onset: 32) in her father.She reports that she has never smoked. She has  never used smokeless tobacco. She reports that she does not drink alcohol or use drugs.  Outpatient Medications Prior to Visit  Medication Sig Dispense Refill  . ergocalciferol (DRISDOL) 50000 units capsule Take 1 capsule (50,000 Units total) by mouth once a week. (Patient not taking: Reported on 04/21/2016) 12 capsule 0   No  facility-administered medications prior to visit.     Review of Systems   Patient denies headache, fevers, malaise, unintentional weight loss,, eye pain, sinus congestion and sinus pain, sore throat, dysphagia,  hemoptysis , cough, dyspnea, wheezing, chest pain, palpitations, orthopnea, edema, abdominal pain, nausea, melena, diarrhea, constipation, flank pain, dysuria, hematuria, urinary  Frequency, nocturia, numbness, tingling, seizures,  Focal weakness, Loss of consciousness,  Tremor, insomnia, depression, anxiety, and suicidal ideation.      Objective:  BP 108/74   Pulse 71   Temp 98.2 F (36.8 C) (Oral)   Resp 12   Ht 5\' 6"  (1.676 m)   Wt 220 lb 12 oz (100.1 kg)   SpO2 96%   BMI 35.63 kg/m   Physical Exam   General appearance: alert, cooperative and appears stated age Head: Normocephalic, without obvious abnormality, atraumatic Eyes: conjunctivae/corneas clear. PERRL, EOM's intact. Fundi benign. Ears: normal TM's and external ear canals both ears Nose: Nares normal. Septum midline. Mucosa normal. No drainage or sinus tenderness. Throat: lips, mucosa, and tongue normal; teeth and gums normal Neck: no adenopathy, no carotid bruit, no JVD, supple, symmetrical, trachea midline and thyroid not enlarged, symmetric, no tenderness/mass/nodules Lungs: clear to auscultation bilaterally Breasts: normal appearance, no masses or tenderness Heart: regular rate and rhythm, S1, S2 normal, no murmur, click, rub or gallop Abdomen: soft, non-tender; bowel sounds normal; no masses,  no organomegaly Extremities: extremities normal, atraumatic, no cyanosis or edema Pulses: 2+ and symmetric Skin: Skin color, texture, turgor normal mild inguinal rash with erythema noted no lesions or excoriations  Neurologic: Alert and oriented X 3, normal strength and tone. Normal symmetric reflexes. Normal coordination and gait.      Assessment & Plan:   Problem List Items Addressed This Visit     Hyperlipidemia LDL goal <160    Using the Framingham risk calculator,  her 10 year risk of coronary artery disease is 5% but increases to 10 % if she develops diabetes. .  Lab Results  Component Value Date   CHOL 244 (H) 04/21/2016   HDL 55.90 04/21/2016   LDLCALC 163 (H) 04/21/2016   LDLDIRECT 167.0 04/21/2016   TRIG 126.0 04/21/2016   CHOLHDL 4 04/21/2016         Obesity    I have addressed  BMI and recommended wt loss of 10% of body weigh over the next 6 months using a low glycemic index diet and regular exercise a minimum of 5 days per week.        Prediabetes    Her  random glucose is not  elevated but her A1c suggests she is at risk for developing diabetes.  I recommend he follow a low glycemic index diet and particpate regularly in an aerobic  exercise activity.  We should check an A1c in 6 months.   Lab Results  Component Value Date   HGBA1C 6.2 04/21/2016         Screening for breast cancer - Primary   Relevant Orders   MM SCREENING BREAST TOMO BILATERAL   Visit for preventive health examination    Annual comprehensive preventive exam was done as well as an evaluation and management  of chronic conditions .  During the course of the visit the patient was educated and counseled about appropriate screening and preventive services including :  diabetes screening, lipid analysis with projected  10 year  risk for CAD , nutrition counseling, breast, cervical and colorectal cancer screening, and recommended immunizations.  Printed recommendations for health maintenance screenings was given      Vitamin D deficiency    Recurrence of low D,  Will treat with Drisdol.      Relevant Orders   VITAMIN D 25 Hydroxy (Vit-D Deficiency, Fractures) (Completed)    Other Visit Diagnoses    Fatigue, unspecified type       Relevant Orders   Comprehensive metabolic panel (Completed)   TSH (Completed)      I am having Mary Chen maintain her hydrocortisone and  ergocalciferol.  Meds ordered this encounter  Medications  . hydrocortisone (CORTIZONE-10) 1 % ointment    Sig: Apply 1 application topically 2 (two) times daily.  . ergocalciferol (DRISDOL) 50000 units capsule    Sig: Take 1 capsule (50,000 Units total) by mouth once a week.    Dispense:  4 capsule    Refill:  3    Medications Discontinued During This Encounter  Medication Reason  . ergocalciferol (DRISDOL) 50000 units capsule Reorder    Follow-up: No Follow-up on file.   Sherlene ShamsULLO, Ethel Veronica L, MD

## 2016-04-22 DIAGNOSIS — E669 Obesity, unspecified: Secondary | ICD-10-CM | POA: Insufficient documentation

## 2016-04-22 LAB — LIPID PANEL
CHOL/HDL RATIO: 4
Cholesterol: 244 mg/dL — ABNORMAL HIGH (ref 0–200)
HDL: 55.9 mg/dL (ref >39.00)
LDL CALC: 163 mg/dL — AB (ref 0–99)
NonHDL: 188.16
Triglycerides: 126 mg/dL (ref 0.0–149.0)
VLDL: 25.2 mg/dL (ref 0.0–40.0)

## 2016-04-22 LAB — COMPREHENSIVE METABOLIC PANEL
ALT: 15 U/L (ref 0–35)
AST: 13 U/L (ref 0–37)
Albumin: 4.2 g/dL (ref 3.5–5.2)
Alkaline Phosphatase: 74 U/L (ref 39–117)
BUN: 14 mg/dL (ref 6–23)
CALCIUM: 9.5 mg/dL (ref 8.4–10.5)
CHLORIDE: 105 meq/L (ref 96–112)
CO2: 29 meq/L (ref 19–32)
Creatinine, Ser: 0.83 mg/dL (ref 0.40–1.20)
GFR: 89.85 mL/min (ref >60.00)
Glucose, Bld: 88 mg/dL (ref 70–99)
Potassium: 4.4 mEq/L (ref 3.5–5.1)
SODIUM: 143 meq/L (ref 135–145)
Total Bilirubin: 0.7 mg/dL (ref 0.2–1.2)
Total Protein: 7.2 g/dL (ref 6.0–8.3)

## 2016-04-22 LAB — MICROALBUMIN / CREATININE URINE RATIO
Creatinine,U: 130.1 mg/dL
MICROALB UR: 4.1 mg/dL — AB (ref 0.0–1.9)
Microalb Creat Ratio: 3.2 mg/g (ref 0.0–30.0)

## 2016-04-22 LAB — HEMOGLOBIN A1C: Hgb A1c MFr Bld: 6.2 % (ref 4.6–6.5)

## 2016-04-22 LAB — LDL CHOLESTEROL, DIRECT: Direct LDL: 167 mg/dL

## 2016-04-22 LAB — TSH: TSH: 1.55 u[IU]/mL (ref 0.35–4.50)

## 2016-04-22 LAB — VITAMIN D 25 HYDROXY (VIT D DEFICIENCY, FRACTURES): VITD: 15.68 ng/mL — ABNORMAL LOW (ref 30.00–100.00)

## 2016-04-22 MED ORDER — ERGOCALCIFEROL 1.25 MG (50000 UT) PO CAPS: 50000.0000 [IU] | ORAL_CAPSULE | ORAL | 3 refills | Status: DC

## 2016-04-22 NOTE — Assessment & Plan Note (Signed)
Recurrence of low D,  Will treat with Drisdol.

## 2016-04-22 NOTE — Assessment & Plan Note (Signed)
Annual comprehensive preventive exam was done as well as an evaluation and management of chronic conditions .  During the course of the visit the patient was educated and counseled about appropriate screening and preventive services including :  diabetes screening, lipid analysis with projected  10 year  risk for CAD , nutrition counseling, breast, cervical and colorectal cancer screening, and recommended immunizations.  Printed recommendations for health maintenance screenings was given 

## 2016-04-22 NOTE — Assessment & Plan Note (Signed)
I have addressed  BMI and recommended wt loss of 10% of body weigh over the next 6 months using a low glycemic index diet and regular exercise a minimum of 5 days per week.   

## 2016-04-22 NOTE — Assessment & Plan Note (Signed)
Her  random glucose is not  elevated but her A1c suggests she is at risk for developing diabetes.  I recommend he follow a low glycemic index diet and particpate regularly in an aerobic  exercise activity.  We should check an A1c in 6 months.   Lab Results  Component Value Date   HGBA1C 6.2 04/21/2016

## 2016-04-22 NOTE — Assessment & Plan Note (Signed)
Using the Framingham risk calculator,  her 10 year risk of coronary artery disease is 5% but increases to 10 % if she develops diabetes. .  Lab Results  Component Value Date   CHOL 244 (H) 04/21/2016   HDL 55.90 04/21/2016   LDLCALC 163 (H) 04/21/2016   LDLDIRECT 167.0 04/21/2016   TRIG 126.0 04/21/2016   CHOLHDL 4 04/21/2016

## 2016-04-23 ENCOUNTER — Telehealth: Payer: Self-pay | Admitting: *Deleted

## 2016-04-23 NOTE — Telephone Encounter (Signed)
Please see result note 

## 2016-04-23 NOTE — Telephone Encounter (Signed)
Patient requested lab results  Pt contact [phone]

## 2016-05-28 ENCOUNTER — Ambulatory Visit
Admission: RE | Admit: 2016-05-28 | Discharge: 2016-05-28 | Disposition: A | Discharge status: Home / Self Care | Payer: 59 | Source: Ambulatory Visit | Attending: Internal Medicine | Admitting: Internal Medicine

## 2016-05-28 DIAGNOSIS — Z1239 Encounter for other screening for malignant neoplasm of breast: Secondary | ICD-10-CM

## 2016-05-28 DIAGNOSIS — Z1231 Encounter for screening mammogram for malignant neoplasm of breast: Secondary | ICD-10-CM | POA: Insufficient documentation

## 2017-02-02 ENCOUNTER — Ambulatory Visit: Payer: Self-pay | Admitting: Physician Assistant

## 2017-09-30 ENCOUNTER — Encounter: Payer: Self-pay | Admitting: Internal Medicine

## 2017-09-30 ENCOUNTER — Ambulatory Visit (INDEPENDENT_AMBULATORY_CARE_PROVIDER_SITE_OTHER): Payer: 59 | Admitting: Internal Medicine

## 2017-09-30 VITALS — BP 100/68 | HR 82 | Temp 98.3°F | Resp 15 | Ht 66.0 in | Wt 221.4 lb

## 2017-09-30 DIAGNOSIS — R5383 Other fatigue: Secondary | ICD-10-CM | POA: Diagnosis not present

## 2017-09-30 DIAGNOSIS — E559 Vitamin D deficiency, unspecified: Secondary | ICD-10-CM

## 2017-09-30 DIAGNOSIS — R7303 Prediabetes: Secondary | ICD-10-CM | POA: Diagnosis not present

## 2017-09-30 DIAGNOSIS — Z1239 Encounter for other screening for malignant neoplasm of breast: Secondary | ICD-10-CM

## 2017-09-30 DIAGNOSIS — Z1231 Encounter for screening mammogram for malignant neoplasm of breast: Secondary | ICD-10-CM

## 2017-09-30 DIAGNOSIS — Z0000 Encounter for general adult medical examination without abnormal findings: Secondary | ICD-10-CM

## 2017-09-30 DIAGNOSIS — R937 Abnormal findings on diagnostic imaging of other parts of musculoskeletal system: Secondary | ICD-10-CM | POA: Diagnosis not present

## 2017-09-30 DIAGNOSIS — E785 Hyperlipidemia, unspecified: Secondary | ICD-10-CM | POA: Diagnosis not present

## 2017-09-30 LAB — COMPREHENSIVE METABOLIC PANEL
ALT: 13 U/L (ref 0–35)
AST: 12 U/L (ref 0–37)
Albumin: 4.1 g/dL (ref 3.5–5.2)
Alkaline Phosphatase: 66 U/L (ref 39–117)
BUN: 19 mg/dL (ref 6–23)
CO2: 27 meq/L (ref 19–32)
CREATININE: 0.81 mg/dL (ref 0.40–1.20)
Calcium: 9.5 mg/dL (ref 8.4–10.5)
Chloride: 105 mEq/L (ref 96–112)
GFR: 91.98 mL/min (ref >60.00)
GLUCOSE: 87 mg/dL (ref 70–99)
Potassium: 4.4 mEq/L (ref 3.5–5.1)
SODIUM: 139 meq/L (ref 135–145)
Total Bilirubin: 0.7 mg/dL (ref 0.2–1.2)
Total Protein: 7.5 g/dL (ref 6.0–8.3)

## 2017-09-30 LAB — LIPID PANEL
CHOL/HDL RATIO: 4
CHOLESTEROL: 205 mg/dL — AB (ref 0–200)
HDL: 54.7 mg/dL (ref >39.00)
LDL CALC: 125 mg/dL — AB (ref 0–99)
NonHDL: 150.03
TRIGLYCERIDES: 124 mg/dL (ref 0.0–149.0)
VLDL: 24.8 mg/dL (ref 0.0–40.0)

## 2017-09-30 LAB — HEMOGLOBIN A1C: Hgb A1c MFr Bld: 6.3 % (ref 4.6–6.5)

## 2017-09-30 LAB — TSH: TSH: 1.08 u[IU]/mL (ref 0.35–4.50)

## 2017-09-30 LAB — VITAMIN D 25 HYDROXY (VIT D DEFICIENCY, FRACTURES): VITD: 18.9 ng/mL — ABNORMAL LOW (ref 30.00–100.00)

## 2017-09-30 MED ORDER — ZOSTER VAC RECOMB ADJUVANTED 50 MCG/0.5ML IM SUSR: 0.5000 mL | Freq: Once | INTRAMUSCULAR | 1 refills | Status: AC

## 2017-09-30 NOTE — Patient Instructions (Addendum)
Good to see you!!!!   I have ordered your mammogram and your Bone Density Testshing  You are due for colonoscopy in 2020    The ShingRx vaccine is now available in local pharmacies and is much more protective thant Zostavaxs,  It is therefore ADVISED for all interested adults over 50 to prevent shingles     Health Maintenance for Postmenopausal Women Menopause is a normal process in which your reproductive ability comes to an end. This process happens gradually over a span of months to years, usually between the ages of 23 and 62. Menopause is complete when you have missed 12 consecutive menstrual periods. It is important to talk with your health care provider about some of the most common conditions that affect postmenopausal women, such as heart disease, cancer, and bone loss (osteoporosis). Adopting a healthy lifestyle and getting preventive care can help to promote your health and wellness. Those actions can also lower your chances of developing some of these common conditions. What should I know about menopause? During menopause, you may experience a number of symptoms, such as:  Moderate-to-severe hot flashes.  Night sweats.  Decrease in sex drive.  Mood swings.  Headaches.  Tiredness.  Irritability.  Memory problems.  Insomnia.  Choosing to treat or not to treat menopausal changes is an individual decision that you make with your health care provider. What should I know about hormone replacement therapy and supplements? Hormone therapy products are effective for treating symptoms that are associated with menopause, such as hot flashes and night sweats. Hormone replacement carries certain risks, especially as you become older. If you are thinking about using estrogen or estrogen with progestin treatments, discuss the benefits and risks with your health care provider. What should I know about heart disease and stroke? Heart disease, heart attack, and stroke become more  likely as you age. This may be due, in part, to the hormonal changes that your body experiences during menopause. These can affect how your body processes dietary fats, triglycerides, and cholesterol. Heart attack and stroke are both medical emergencies. There are many things that you can do to help prevent heart disease and stroke:  Have your blood pressure checked at least every 1-2 years. High blood pressure causes heart disease and increases the risk of stroke.  If you are 37-33 years old, ask your health care provider if you should take aspirin to prevent a heart attack or a stroke.  Do not use any tobacco products, including cigarettes, chewing tobacco, or electronic cigarettes. If you need help quitting, ask your health care provider.  It is important to eat a healthy diet and maintain a healthy weight. ? Be sure to include plenty of vegetables, fruits, low-fat dairy products, and lean protein. ? Avoid eating foods that are high in solid fats, added sugars, or salt (sodium).  Get regular exercise. This is one of the most important things that you can do for your health. ? Try to exercise for at least 150 minutes each week. The type of exercise that you do should increase your heart rate and make you sweat. This is known as moderate-intensity exercise. ? Try to do strengthening exercises at least twice each week. Do these in addition to the moderate-intensity exercise.  Know your numbers.Ask your health care provider to check your cholesterol and your blood glucose. Continue to have your blood tested as directed by your health care provider.  What should I know about cancer screening? There are several types of cancer.  Take the following steps to reduce your risk and to catch any cancer development as early as possible. Breast Cancer  Practice breast self-awareness. ? This means understanding how your breasts normally appear and feel. ? It also means doing regular breast self-exams.  Let your health care provider know about any changes, no matter how small.  If you are 51 or older, have a clinician do a breast exam (clinical breast exam or CBE) every year. Depending on your age, family history, and medical history, it may be recommended that you also have a yearly breast X-ray (mammogram).  If you have a family history of breast cancer, talk with your health care provider about genetic screening.  If you are at high risk for breast cancer, talk with your health care provider about having an MRI and a mammogram every year.  Breast cancer (BRCA) gene test is recommended for women who have family members with BRCA-related cancers. Results of the assessment will determine the need for genetic counseling and BRCA1 and for BRCA2 testing. BRCA-related cancers include these types: ? Breast. This occurs in males or females. ? Ovarian. ? Tubal. This may also be called fallopian tube cancer. ? Cancer of the abdominal or pelvic lining (peritoneal cancer). ? Prostate. ? Pancreatic.  Cervical, Uterine, and Ovarian Cancer Your health care provider may recommend that you be screened regularly for cancer of the pelvic organs. These include your ovaries, uterus, and vagina. This screening involves a pelvic exam, which includes checking for microscopic changes to the surface of your cervix (Pap test).  For women ages 21-65, health care providers may recommend a pelvic exam and a Pap test every three years. For women ages 38-65, they may recommend the Pap test and pelvic exam, combined with testing for human papilloma virus (HPV), every five years. Some types of HPV increase your risk of cervical cancer. Testing for HPV may also be done on women of any age who have unclear Pap test results.  Other health care providers may not recommend any screening for nonpregnant women who are considered low risk for pelvic cancer and have no symptoms. Ask your health care provider if a screening pelvic exam  is right for you.  If you have had past treatment for cervical cancer or a condition that could lead to cancer, you need Pap tests and screening for cancer for at least 20 years after your treatment. If Pap tests have been discontinued for you, your risk factors (such as having a new sexual partner) need to be reassessed to determine if you should start having screenings again. Some women have medical problems that increase the chance of getting cervical cancer. In these cases, your health care provider may recommend that you have screening and Pap tests more often.  If you have a family history of uterine cancer or ovarian cancer, talk with your health care provider about genetic screening.  If you have vaginal bleeding after reaching menopause, tell your health care provider.  There are currently no reliable tests available to screen for ovarian cancer.  Lung Cancer Lung cancer screening is recommended for adults 24-72 years old who are at high risk for lung cancer because of a history of smoking. A yearly low-dose CT scan of the lungs is recommended if you:  Currently smoke.  Have a history of at least 30 pack-years of smoking and you currently smoke or have quit within the past 15 years. A pack-year is smoking an average of one pack of cigarettes  per day for one year.  Yearly screening should:  Continue until it has been 15 years since you quit.  Stop if you develop a health problem that would prevent you from having lung cancer treatment.  Colorectal Cancer  This type of cancer can be detected and can often be prevented.  Routine colorectal cancer screening usually begins at age 22 and continues through age 63.  If you have risk factors for colon cancer, your health care provider may recommend that you be screened at an earlier age.  If you have a family history of colorectal cancer, talk with your health care provider about genetic screening.  Your health care provider may also  recommend using home test kits to check for hidden blood in your stool.  A small camera at the end of a tube can be used to examine your colon directly (sigmoidoscopy or colonoscopy). This is done to check for the earliest forms of colorectal cancer.  Direct examination of the colon should be repeated every 5-10 years until age 12. However, if early forms of precancerous polyps or small growths are found or if you have a family history or genetic risk for colorectal cancer, you may need to be screened more often.  Skin Cancer  Check your skin from head to toe regularly.  Monitor any moles. Be sure to tell your health care provider: ? About any new moles or changes in moles, especially if there is a change in a mole's shape or color. ? If you have a mole that is larger than the size of a pencil eraser.  If any of your family members has a history of skin cancer, especially at a young age, talk with your health care provider about genetic screening.  Always use sunscreen. Apply sunscreen liberally and repeatedly throughout the day.  Whenever you are outside, protect yourself by wearing long sleeves, pants, a wide-brimmed hat, and sunglasses.  What should I know about osteoporosis? Osteoporosis is a condition in which bone destruction happens more quickly than new bone creation. After menopause, you may be at an increased risk for osteoporosis. To help prevent osteoporosis or the bone fractures that can happen because of osteoporosis, the following is recommended:  If you are 64-61 years old, get at least 1,000 mg of calcium and at least 600 mg of vitamin D per day.  If you are older than age 66 but younger than age 88, get at least 1,200 mg of calcium and at least 600 mg of vitamin D per day.  If you are older than age 58, get at least 1,200 mg of calcium and at least 800 mg of vitamin D per day.  Smoking and excessive alcohol intake increase the risk of osteoporosis. Eat foods that are  rich in calcium and vitamin D, and do weight-bearing exercises several times each week as directed by your health care provider. What should I know about how menopause affects my mental health? Depression may occur at any age, but it is more common as you become older. Common symptoms of depression include:  Low or sad mood.  Changes in sleep patterns.  Changes in appetite or eating patterns.  Feeling an overall lack of motivation or enjoyment of activities that you previously enjoyed.  Frequent crying spells.  Talk with your health care provider if you think that you are experiencing depression. What should I know about immunizations? It is important that you get and maintain your immunizations. These include:  Tetanus, diphtheria, and  pertussis (Tdap) booster vaccine.  Influenza every year before the flu season begins.  Pneumonia vaccine.  Shingles vaccine.  Your health care provider may also recommend other immunizations. This information is not intended to replace advice given to you by your health care provider. Make sure you discuss any questions you have with your health care provider. Document Released: 06/25/2005 Document Revised: 11/21/2015 Document Reviewed: 02/04/2015 Elsevier Interactive Patient Education  2018 [employer].

## 2017-09-30 NOTE — Progress Notes (Signed)
Patient ID: Mary Chen, female    DOB: 1955/09/14  Age: 62 y.o. MRN: [id number]  The patient is here for annual preventive examination and management of other chronic and acute problems.   The risk factors are reflected in the social history.  The roster of all physicians providing medical care to patient - is listed in the Snapshot section of the chart.  Activities of daily living:  The patient is 100% independent in all ADLs: dressing, toileting, feeding as well as independent mobility  Home safety : The patient has smoke detectors in the home. They wear seatbelts.  There are no firearms at home. There is no violence in the home.   There is no risks for hepatitis, STDs or HIV. There is no   history of blood transfusion. They have no travel history to infectious disease endemic areas of the world.  The patient has seen their dentist in the last six month. They have seen their eye doctor in the last year. They admit to slight hearing difficulty with regard to whispered voices and some television programs.  They have deferred audiologic testing in the last year.  They do not  have excessive sun exposure. Discussed the need for sun protection: hats, long sleeves and use of sunscreen if there is significant sun exposure.   Diet: the importance of a healthy diet is discussed. They do have a healthy diet.  The benefits of regular aerobic exercise were discussed. She walks 4 times per week ,  20 minutes.   Depression screen: there are no signs or vegative symptoms of depression- irritability, change in appetite, anhedonia, sadness/tearfullness.  Cognitive assessment: the patient manages all their financial and personal affairs and is actively engaged. They could relate day,date,year and events; recalled 2/3 objects at 3 minutes; performed clock-face test normally.  The following portions of the patient's history were reviewed and updated as appropriate: allergies, current medications, past  family history, past medical history,  past surgical history, past social history  and problem list.  Visual acuity was not assessed per patient preference since she has regular follow up with her ophthalmologist. Hearing and body mass index were assessed and reviewed.   During the course of the visit the patient was educated and counseled about appropriate screening and preventive services including : fall prevention , diabetes screening, nutrition counseling, colorectal cancer screening, and recommended immunizations.    CC: The primary encounter diagnosis was Abnormal bone density screening. Diagnoses of Prediabetes, Vitamin D deficiency, Fatigue, unspecified type, Visit for preventive health examination, Breast cancer screening, and Hyperlipidemia LDL goal <160 were also pertinent to this visit.  History Amarys has a past medical history of Benign colonic polyp (Nov 2010), Fibrocystic disease of breast, pulmonary embolism (Nov 2009), and Ovarian cyst.   She has a past surgical history that includes Total abdominal hysterectomy w/ bilateral salpingoophorectomy (2009); Abdominal hysterectomy; Breast biopsy (Left); and Breast excisional biopsy (Left).   Her family history includes Bipolar disorder in her brother; Breast cancer in her mother; Cancer in her mother; Cancer (age of onset: 28) in her father.She reports that she has never smoked. She has never used smokeless tobacco. She reports that she does not drink alcohol or use drugs.  Outpatient Medications Prior to Visit  Medication Sig Dispense Refill  . ergocalciferol (DRISDOL) 50000 units capsule Take 1 capsule (50,000 Units total) by mouth once a week. (Patient not taking: Reported on 09/30/2017) 4 capsule 3  . hydrocortisone (CORTIZONE-10) 1 % ointment Apply 1  application topically 2 (two) times daily.     No facility-administered medications prior to visit.     Review of Systems  Patient denies headache, fevers, malaise,  unintentional weight loss, skin rash, eye pain, sinus congestion and sinus pain, sore throat, dysphagia,  hemoptysis , cough, dyspnea, wheezing, chest pain, palpitations, orthopnea, edema, abdominal pain, nausea, melena, diarrhea, constipation, flank pain, dysuria, hematuria, urinary  Frequency, nocturia, numbness, tingling, seizures,  Focal weakness, Loss of consciousness,  Tremor, insomnia, depression, anxiety, and suicidal ideation.     Objective:  BP 100/68 (BP Location: Left Arm, Patient Position: Sitting, Cuff Size: Large)   Pulse 82   Temp 98.3 F (36.8 C) (Oral)   Resp 15   Ht  (1.676 m)   Wt 221 lb 6.4 oz (100.4 kg)   SpO2 96%   BMI 35.73 kg/m   Physical Exam    General appearance: alert, cooperative and appears stated age Head: Normocephalic, without obvious abnormality, atraumatic Eyes: conjunctivae/corneas clear. PERRL, EOM's intact. Fundi benign. Ears: normal TM's and external ear canals both ears Nose: Nares normal. Septum midline. Mucosa normal. No drainage or sinus tenderness. Throat: lips, mucosa, and tongue normal; teeth and gums normal Neck: no adenopathy, no carotid bruit, no JVD, supple, symmetrical, trachea midline and thyroid not enlarged, symmetric, no tenderness/mass/nodules Lungs: clear to auscultation bilaterally Breasts: normal appearance, no masses or tenderness Heart: regular rate and rhythm, S1, S2 normal, no murmur, click, rub or gallop Abdomen: soft, non-tender; bowel sounds normal; no masses,  no organomegaly Extremities: extremities normal, atraumatic, no cyanosis or edema Pulses: 2+ and symmetric Skin: Skin color, texture, turgor normal. No rashes or lesions Neurologic: Alert and oriented X 3, normal strength and tone. Normal symmetric reflexes. Normal coordination and gait.     Assessment & Plan:   Problem List Items Addressed This Visit    Vitamin D deficiency    Recurrent.  Weekly megadose prescribed  For 3 months followed by  continued use of OTC supplements at 2000 Ius daily       Relevant Orders   VITAMIN D 25 Hydroxy (Vit-D Deficiency, Fractures) (Completed)   Visit for preventive health examination    Annual comprehensive preventive exam was done as well as an evaluation and management of chronic conditions .  During the course of the visit the patient was educated and counseled about appropriate screening and preventive services including :  diabetes screening, lipid analysis with projected  10 year  risk for CAD , nutrition counseling, breast, cervical and colorectal cancer screening, and recommended immunizations.  Printed recommendations for health maintenance screenings was given      Prediabetes    Her  random glucose is not  elevated but her rising  A1c suggests she is at risk for developing diabetes.  I recommend she follow a low glycemic index diet and particpate regularly in an aerobic  exercise activity.  We should check an A1c in 6 months.   Lab Results  Component Value Date   HGBA1C 6.3 09/30/2017         Relevant Orders   Hemoglobin A1c (Completed)   Lipid panel (Completed)   Comprehensive metabolic panel (Completed)   Hyperlipidemia LDL goal <160    Using the Framingham risk calculator,  her 10 year risk of coronary artery disease is 4% . No therapy warranted   Lab Results  Component Value Date   CHOL 205 (H) 09/30/2017   HDL 54.70 09/30/2017   LDLCALC 125 (H) 09/30/2017  LDLDIRECT 167.0 04/21/2016   TRIG 124.0 09/30/2017   CHOLHDL 4 09/30/2017          Other Visit Diagnoses    Abnormal bone density screening    -  Primary   Relevant Orders   DG Bone Density   Fatigue, unspecified type       Relevant Orders   TSH (Completed)   HIV antibody (Completed)   Breast cancer screening       Relevant Orders   MM 3D SCREEN BREAST BILATERAL      I have discontinued Iver P. Wigal's hydrocortisone and ergocalciferol. I am also having her start on Zoster Vaccine Adjuvanted  and ergocalciferol.  Meds ordered this encounter  Medications  . Zoster Vaccine Adjuvanted [facility]) injection    Sig: Inject 0.5 mLs into the muscle once for 1 dose.    Dispense:  1 each    Refill:  1  . ergocalciferol (DRISDOL) 50000 units capsule    Sig: Take 1 capsule (50,000 Units total) by mouth once a week.    Dispense:  12 capsule    Refill:  0    Medications Discontinued During This Encounter  Medication Reason  . ergocalciferol (DRISDOL) 50000 units capsule Completed Course  . hydrocortisone (CORTIZONE-10) 1 % ointment Completed Course    Follow-up: No follow-ups on file.   Sherlene Shams, MD

## 2017-10-01 LAB — HIV ANTIBODY (ROUTINE TESTING W REFLEX): HIV: NONREACTIVE

## 2017-10-02 ENCOUNTER — Encounter: Payer: Self-pay | Admitting: Internal Medicine

## 2017-10-02 MED ORDER — ERGOCALCIFEROL 1.25 MG (50000 UT) PO CAPS: 50000.0000 [IU] | ORAL_CAPSULE | ORAL | 0 refills | Status: DC

## 2017-10-02 NOTE — Assessment & Plan Note (Addendum)
Recurrent.  Weekly megadose prescribed  For 3 months followed by continued use of OTC supplements at 2000 Ius daily

## 2017-10-02 NOTE — Assessment & Plan Note (Signed)
Annual comprehensive preventive exam was done as well as an evaluation and management of chronic conditions .  During the course of the visit the patient was educated and counseled about appropriate screening and preventive services including :  diabetes screening, lipid analysis with projected  10 year  risk for CAD , nutrition counseling, breast, cervical and colorectal cancer screening, and recommended immunizations.  Printed recommendations for health maintenance screenings was given 

## 2017-10-02 NOTE — Assessment & Plan Note (Signed)
Using the Framingham risk calculator,  her 10 year risk of coronary artery disease is 4% . No therapy warranted   Lab Results  Component Value Date   CHOL 205 (H) 09/30/2017   HDL 54.70 09/30/2017   LDLCALC 125 (H) 09/30/2017   LDLDIRECT 167.0 04/21/2016   TRIG 124.0 09/30/2017   CHOLHDL 4 09/30/2017

## 2017-10-02 NOTE — Assessment & Plan Note (Signed)
Her  random glucose is not  elevated but her rising  A1c suggests she is at risk for developing diabetes.  I recommend she follow a low glycemic index diet and particpate regularly in an aerobic  exercise activity.  We should check an A1c in 6 months.   Lab Results  Component Value Date   HGBA1C 6.3 09/30/2017

## 2017-11-16 ENCOUNTER — Ambulatory Visit
Admission: RE | Admit: 2017-11-16 | Discharge: 2017-11-16 | Disposition: A | Discharge status: Home / Self Care | Payer: 59 | Source: Ambulatory Visit | Attending: Internal Medicine | Admitting: Internal Medicine

## 2017-11-16 DIAGNOSIS — R937 Abnormal findings on diagnostic imaging of other parts of musculoskeletal system: Secondary | ICD-10-CM | POA: Diagnosis not present

## 2017-11-16 DIAGNOSIS — Z780 Asymptomatic menopausal state: Secondary | ICD-10-CM | POA: Diagnosis not present

## 2017-11-16 DIAGNOSIS — Z1231 Encounter for screening mammogram for malignant neoplasm of breast: Secondary | ICD-10-CM | POA: Diagnosis not present

## 2017-11-16 DIAGNOSIS — Z1239 Encounter for other screening for malignant neoplasm of breast: Secondary | ICD-10-CM

## 2018-04-04 ENCOUNTER — Encounter: Payer: Self-pay | Admitting: Internal Medicine

## 2018-04-04 ENCOUNTER — Ambulatory Visit (INDEPENDENT_AMBULATORY_CARE_PROVIDER_SITE_OTHER): Payer: 59 | Admitting: Internal Medicine

## 2018-04-04 VITALS — BP 120/88 | HR 77 | Temp 98.3°F | Resp 14 | Ht 66.0 in | Wt 229.0 lb

## 2018-04-04 DIAGNOSIS — E559 Vitamin D deficiency, unspecified: Secondary | ICD-10-CM | POA: Diagnosis not present

## 2018-04-04 DIAGNOSIS — R7303 Prediabetes: Secondary | ICD-10-CM

## 2018-04-04 DIAGNOSIS — R718 Other abnormality of red blood cells: Secondary | ICD-10-CM | POA: Diagnosis not present

## 2018-04-04 DIAGNOSIS — E6609 Other obesity due to excess calories: Secondary | ICD-10-CM | POA: Diagnosis not present

## 2018-04-04 DIAGNOSIS — Z6831 Body mass index (BMI) 31.0-31.9, adult: Secondary | ICD-10-CM

## 2018-04-04 DIAGNOSIS — E785 Hyperlipidemia, unspecified: Secondary | ICD-10-CM | POA: Diagnosis not present

## 2018-04-04 MED ORDER — ERGOCALCIFEROL 1.25 MG (50000 UT) PO CAPS: 50000.0000 [IU] | ORAL_CAPSULE | ORAL | 3 refills | Status: DC

## 2018-04-04 MED ORDER — ZOSTER VAC RECOMB ADJUVANTED 50 MCG/0.5ML IM SUSR: 0.5000 mL | Freq: Once | INTRAMUSCULAR | 1 refills | Status: AC

## 2018-04-04 NOTE — Patient Instructions (Addendum)
Good to see you!   I recommend taking the Mega dose of Vitamin D once a week FOR THE NEXT YEAR  1200 MG CALCIUM THROUGH DIET (prefrerably) for bone health   Call Nichelle!  She can help you get back into regular exercise patterns     The ShingRx vaccine is now available in local pharmacies and is much more protective thant Zostavaxs,  It is therefore ADVISED for all interested adults over 50 to prevent shingles. Ask HR if [facility] will pay for it

## 2018-04-04 NOTE — Progress Notes (Signed)
Subjective:  Patient ID: Mary Chen, female    DOB: 01-May-1956  Age: 62 y.o. MRN: [id number]  CC: The primary encounter diagnosis was Prediabetes. Diagnoses of Microcytosis, Hyperlipidemia LDL goal <160, Class 1 obesity due to excess calories without serious comorbidity with body mass index (BMI) of 31.0 to 31.9 in adult, and Vitamin D deficiency were also pertinent to this visit.  HPI Mary Chen presents for follow up on obesity and  recent osteoporosis screening. She is walking regularly for exercise , and lifting weights once a week at best  .  She feels great ,  Has a good energy and denies any complaints today    Mammogram normal July .  Discussed results of recent DEXA scan   Outpatient Medications Prior to Visit  Medication Sig Dispense Refill  . ergocalciferol (DRISDOL) 50000 units capsule Take 1 capsule (50,000 Units total) by mouth once a week. (Patient not taking: Reported on 04/04/2018) 12 capsule 0   No facility-administered medications prior to visit.     Review of Systems;  Patient denies headache, fevers, malaise, unintentional weight loss, skin rash, eye pain, sinus congestion and sinus pain, sore throat, dysphagia,  hemoptysis , cough, dyspnea, wheezing, chest pain, palpitations, orthopnea, edema, abdominal pain, nausea, melena, diarrhea, constipation, flank pain, dysuria, hematuria, urinary  Frequency, nocturia, numbness, tingling, seizures,  Focal weakness, Loss of consciousness,  Tremor, insomnia, depression, anxiety, and suicidal ideation.      Objective:  BP 120/88 (BP Location: Left Arm, Patient Position: Sitting, Cuff Size: Normal)   Pulse 77   Temp 98.3 F (36.8 C) (Oral)   Resp 14   Ht 5\' 6"  (1.676 m)   Wt 229 lb (103.9 kg)   SpO2 95%   BMI 36.96 kg/m   BP Readings from Last 3 Encounters:  04/04/18 120/88  09/30/17 100/68  04/21/16 108/74    Wt Readings from Last 3 Encounters:  04/04/18 229 lb (103.9 kg)  09/30/17 221 lb 6.4  oz (100.4 kg)  04/21/16 220 lb 12 oz (100.1 kg)    General appearance: alert, cooperative and appears stated age Ears: normal TM's and external ear canals both ears Throat: lips, mucosa, and tongue normal; teeth and gums normal Neck: no adenopathy, no carotid bruit, supple, symmetrical, trachea midline and thyroid not enlarged, symmetric, no tenderness/mass/nodules Back: symmetric, no curvature. ROM normal. No CVA tenderness. Lungs: clear to auscultation bilaterally Heart: regular rate and rhythm, S1, S2 normal, no murmur, click, rub or gallop Abdomen: soft, non-tender; bowel sounds normal; no masses,  no organomegaly Pulses: 2+ and symmetric Skin: Skin color, texture, turgor normal. No rashes or lesions Lymph nodes: Cervical, supraclavicular, and axillary nodes normal.  Lab Results  Component Value Date   HGBA1C 6.3 09/30/2017   HGBA1C 6.2 04/21/2016   HGBA1C 6.2 04/21/2015    Lab Results  Component Value Date   CREATININE 0.81 09/30/2017   CREATININE 0.83 04/21/2016   CREATININE 0.80 04/21/2015    Lab Results  Component Value Date   WBC 5.8 04/21/2015   HGB 13.5 04/21/2015   HCT 43.4 04/21/2015   PLT 258.0 04/21/2015   GLUCOSE 87 09/30/2017   CHOL 205 (H) 09/30/2017   TRIG 124.0 09/30/2017   HDL 54.70 09/30/2017   LDLDIRECT 167.0 04/21/2016   LDLCALC 125 (H) 09/30/2017   ALT 13 09/30/2017   AST 12 09/30/2017   NA 139 09/30/2017   K 4.4 09/30/2017   CL 105 09/30/2017   CREATININE 0.81 09/30/2017   BUN  19 09/30/2017   CO2 27 09/30/2017   TSH 1.08 09/30/2017   HGBA1C 6.3 09/30/2017   MICROALBUR 4.1 (H) 04/21/2016    Dg Bone Density  Result Date: 11/16/2017 EXAM: DUAL X-RAY ABSORPTIOMETRY (DXA) FOR BONE MINERAL DENSITY IMPRESSION: Dear Dr. Darrick Huntsmanullo, Your patient Mary Chen completed a BMD test on 11/16/2017 using the Lunar iDXA DXA System (analysis version: 14.10) manufactured by [employer]. The following summarizes the results of our evaluation. PATIENT  BIOGRAPHICAL: Name: Mary HalfFaucette, Mary P Patient ID: [id number] Birth Date: [birth year] Height: 66.0 in. Gender: Female Exam Date: 11/16/2017 Weight: 223.0 lbs. Indications: Hysterectomy, Oophorectomy Bilateral, Postmenopausal Fractures: Treatments: Vitamin D ASSESSMENT: The BMD measured at Femur Neck Right is 1.048 g/cm2 with a T-score of 0.1. This patient is considered normal according to World Health Organization [facility](WHO) criteria. Site Region Measured Measured WHO Young Adult BMD Date       Age      Classification T-score AP Spine L1-L4 11/16/2017 62.6 Normal 0.2 1.216 g/cm2 DualFemur Neck Right 11/16/2017 62.6 Normal 0.1 1.048 g/cm2 World Health Organization [facility](WHO) criteria for post-menopausal, Caucasian Women: Normal:       T-score at or above -1 SD Osteopenia:   T-score between -1 and -2.5 SD Osteoporosis: T-score at or below -2.5 SD RECOMMENDATIONS: 1. All patients should optimize calcium and vitamin D intake. 2. Consider FDA-approved medical therapies in postmenopausal women and men aged 63 years and older, based on the following: a. A hip or vertebral(clinical or morphometric) fracture b. T-score < -2.5 at the femoral neck or spine after appropriate evaluation to exclude secondary causes c. Low bone mass (T-score between -1.0 and -2.5 at the femoral neck or spine) and a 10-year probability of a hip fracture > 3% or a 10-year probability of a major osteoporosis-related fracture > 20% based on the [place]-adapted WHO algorithm d. Clinician judgment and/or patient preferences may indicate treatment for people with 10-year fracture probabilities above or below these levels FOLLOW-UP: People with diagnosed cases of osteoporosis or at high risk for fracture should have regular bone mineral density tests. For patients eligible for Medicare, routine testing is allowed once every 2 years. The testing frequency can be increased to one year for patients who have rapidly progressing disease, those who are receiving or  discontinuing medical therapy to restore bone mass, or have additional risk factors. I have reviewed this report, and agree with the above findings. [facility] Electronically Signed   By: Bretta BangWilliam  Woodruff III M.D.   On: 11/16/2017 09:12   Mm 3d Screen Breast Bilateral  Result Date: 11/16/2017 CLINICAL DATA:  Screening. EXAM: DIGITAL SCREENING BILATERAL MAMMOGRAM WITH TOMO AND CAD COMPARISON:  Previous exam(s). ACR Breast Density Category c: The breast tissue is heterogeneously dense, which may obscure small masses. FINDINGS: There are no findings suspicious for malignancy. Images were processed with CAD. IMPRESSION: No mammographic evidence of malignancy. A result letter of this screening mammogram will be mailed directly to the patient. RECOMMENDATION: Screening mammogram in one year. (Code:SM-B-01Y) BI-RADS CATEGORY  1: Negative. Electronically Signed   By: Baird Lyonsina  Arceo M.D.   On: 11/16/2017 12:06    Assessment & Plan:   Problem List Items Addressed This Visit    Hyperlipidemia LDL goal <160   Relevant Orders   Lipid panel   TSH   Microcytosis   Obesity    I have addressed  BMI and recommended a low glycemic index diet utilizing smaller more frequent meals to increase metabolism.  I have also recommended that patient  start exercising with a goal of 30 minutes of aerobic exercise a minimum of 5 days per week. Screening for lipid disorders, thyroid and diabetes  Has been done       Prediabetes - Primary    Her  random glucose is not  elevated but her rising  A1c suggests she is at risk for developing diabetes.  She has bee following a  low glycemic index diet and particpating regularly in an aerobic  exercise activity.  We will check an A1c in 6 months.   Lab Results  Component Value Date   HGBA1C 6.3 09/30/2017         Relevant Orders   Comprehensive metabolic panel   Hemoglobin A1c   Vitamin D deficiency    Recurrent.  Will continue he weekly megadose       A total of  40 minutes of face to face time was spent with patient more than Chen of which was spent in counselling and coordination of care   I have changed Mary Chen's ergocalciferol. I am also having her start on Zoster Vaccine Adjuvanted.  Meds ordered this encounter  Medications  . ergocalciferol (DRISDOL) 1.25 MG (50000 UT) capsule    Sig: Take 1 capsule (50,000 Units total) by mouth once a week.    Dispense:  12 capsule    Refill:  3  . Zoster Vaccine Adjuvanted [facility]) injection    Sig: Inject 0.5 mLs into the muscle once for 1 dose.    Dispense:  1 each    Refill:  1    Medications Discontinued During This Encounter  Medication Reason  . ergocalciferol (DRISDOL) 50000 units capsule Patient has not taken in last 30 days  . ergocalciferol (DRISDOL) 50000 units capsule Patient has not taken in last 30 days    Follow-up: Return in about 6 months (around 10/03/2018) for cpe.   Sherlene Shams, MD

## 2018-04-06 NOTE — Assessment & Plan Note (Signed)
Recurrent.  Will continue he weekly megadose

## 2018-04-06 NOTE — Assessment & Plan Note (Addendum)
I have addressed  BMI and recommended a low glycemic index diet utilizing smaller more frequent meals to increase metabolism.  I have also recommended that patient start exercising with a goal of 30 minutes of aerobic exercise a minimum of 5 days per week. Screening for lipid disorders, thyroid and diabetes  Has been done  

## 2018-04-06 NOTE — Assessment & Plan Note (Signed)
Her  random glucose is not  elevated but her rising  A1c suggests she is at risk for developing diabetes.  She has bee following a  low glycemic index diet and particpating regularly in an aerobic  exercise activity.  We will check an A1c in 6 months.   Lab Results  Component Value Date   HGBA1C 6.3 09/30/2017

## 2018-10-10 ENCOUNTER — Other Ambulatory Visit: Payer: Self-pay

## 2018-10-11 ENCOUNTER — Ambulatory Visit (INDEPENDENT_AMBULATORY_CARE_PROVIDER_SITE_OTHER): Payer: 59 | Admitting: Internal Medicine

## 2018-10-11 ENCOUNTER — Encounter: Payer: Self-pay | Admitting: Internal Medicine

## 2018-10-11 ENCOUNTER — Other Ambulatory Visit: Payer: Self-pay | Admitting: Internal Medicine

## 2018-10-11 VITALS — BP 120/86 | HR 88 | Temp 98.4°F | Resp 14 | Ht 66.0 in | Wt 236.0 lb

## 2018-10-11 DIAGNOSIS — Z1239 Encounter for other screening for malignant neoplasm of breast: Secondary | ICD-10-CM

## 2018-10-11 DIAGNOSIS — E6609 Other obesity due to excess calories: Secondary | ICD-10-CM

## 2018-10-11 DIAGNOSIS — E785 Hyperlipidemia, unspecified: Secondary | ICD-10-CM | POA: Diagnosis not present

## 2018-10-11 DIAGNOSIS — Z1211 Encounter for screening for malignant neoplasm of colon: Secondary | ICD-10-CM

## 2018-10-11 DIAGNOSIS — R7303 Prediabetes: Secondary | ICD-10-CM | POA: Diagnosis not present

## 2018-10-11 DIAGNOSIS — Z6831 Body mass index (BMI) 31.0-31.9, adult: Secondary | ICD-10-CM

## 2018-10-11 DIAGNOSIS — Z0001 Encounter for general adult medical examination with abnormal findings: Secondary | ICD-10-CM | POA: Diagnosis not present

## 2018-10-11 NOTE — Progress Notes (Signed)
Patient ID: Mary Chen, female    DOB: Aug 23, 1955  Age: 63 y.o. MRN: [id number]  The patient is here for annual preventive  examination and management of other chronic and acute problems.  Colon CA screen: colonoscopy Dec 2010 , Dr Bluford Kaufmann. Breast ca screen: July 2019 Cervical ca screen: TAH/BSO 2009 DEXA scan  July 2019   The risk factors are reflected in the social history.  The roster of all physicians providing medical care to patient - is listed in the Snapshot section of the chart.  Activities of daily living:  The patient is 100% independent in all ADLs: dressing, toileting, feeding as well as independent mobility  Home safety : The patient has smoke detectors in the home. They wear seatbelts.  There are no firearms at home. There is no violence in the home.   There is no risks for hepatitis, STDs or HIV. There is no   history of blood transfusion. They have no travel history to infectious disease endemic areas of the world.  The patient has seen their dentist in the last six month. They have seen their eye doctor in the last year. They deny hearing difficulty with regard to whispered voices and some television programs.  They have deferred audiologic testing in the last year.  They do not  have excessive sun exposure. Discussed the need for sun protection: hats, long sleeves and use of sunscreen if there is significant sun exposure.   Diet: the importance of a healthy diet is discussed.  She has gained over 10 lbs since the COVID epidemic resulted in the "shelter at home " precautions.   The benefits of regular aerobic exercise were discussed. She is not exercising regularly   Depression screen: there are no signs or vegative symptoms of depression- irritability, change in appetite, anhedonia, sadness/tearfullness.   The following portions of the patient's history were reviewed and updated as appropriate: allergies, current medications, past family history, past medical history,   past surgical history, past social history  and problem list.  Visual acuity was not assessed per patient preference since she has regular follow up with her ophthalmologist. Hearing and body mass index were assessed and reviewed.   During the course of the visit the patient was educated and counseled about appropriate screening and preventive services including : fall prevention , diabetes screening, nutrition counseling, colorectal cancer screening, and recommended immunizations.    CC: The primary encounter diagnosis was Breast cancer screening. Diagnoses of Hyperlipidemia LDL goal <160, Prediabetes, Screening for colon cancer, Class 1 obesity due to excess calories without serious comorbidity with body mass index (BMI) of 31.0 to 31.9 in adult, and Encounter for general adult medical examination with abnormal findings were also pertinent to this visit.  History Mary Chen has a past medical history of Fibrocystic disease of breast, pulmonary embolism (Nov 2009), and Ovarian cyst.   She has a past surgical history that includes Total abdominal hysterectomy w/ bilateral salpingoophorectomy (2009); Abdominal hysterectomy; Breast biopsy (Left); and Breast excisional biopsy (Left).   Her family history includes Bipolar disorder in her brother; Breast cancer (age of onset: 68) in her mother; Cancer in her mother; Cancer (age of onset: 41) in her father.She reports that she has never smoked. She has never used smokeless tobacco. She reports that she does not drink alcohol or use drugs.  Outpatient Medications Prior to Visit  Medication Sig Dispense Refill  . ergocalciferol (DRISDOL) 1.25 MG (50000 UT) capsule Take 1 capsule (50,000 Units total) by  mouth once a week. 12 capsule 3   No facility-administered medications prior to visit.     Review of Systems   Patient denies headache, fevers, malaise, unintentional weight loss, skin rash, eye pain, sinus congestion and sinus pain, sore throat,  dysphagia,  hemoptysis , cough, dyspnea, wheezing, chest pain, palpitations, orthopnea, edema, abdominal pain, nausea, melena, diarrhea, constipation, flank pain, dysuria, hematuria, urinary  Frequency, nocturia, numbness, tingling, seizures,  Focal weakness, Loss of consciousness,  Tremor, insomnia, depression, anxiety, and suicidal ideation.      Objective:  BP 120/86 (BP Location: Right Arm, Patient Position: Sitting, Cuff Size: Large)   Pulse 88   Temp 98.4 F (36.9 C) (Oral)   Resp 14   Ht 5\' 6"  (1.676 m)   Wt 236 lb (107 kg)   SpO2 97%   BMI 38.09 kg/m   Physical Exam  General appearance: alert, cooperative and appears stated age Ears: normal TM's and external ear canals both ears Throat: lips, mucosa, and tongue normal; teeth and gums normal Neck: no adenopathy, no carotid bruit, supple, symmetrical, trachea midline and thyroid not enlarged, symmetric, no tenderness/mass/nodules Back: symmetric, no curvature. ROM normal. No CVA tenderness. Lungs: clear to auscultation bilaterally Heart: regular rate and rhythm, S1, S2 normal, no murmur, click, rub or gallop Abdomen: soft, non-tender; bowel sounds normal; no masses,  no organomegaly Pulses: 2+ and symmetric Skin: Skin color, texture, turgor normal. No rashes or lesions Lymph nodes: Cervical, supraclavicular, and axillary nodes normal.   Assessment & Plan:   Problem List Items Addressed This Visit    Diabetes mellitus type II, controlled, with no complications (HCC)    Progression from prediabetes,  a1c now 6.5 .  counselling given. rtc 6 months       Hyperlipidemia LDL goal <160    Using the Framingham risk calculator,  her 10 year risk of coronary artery disease has risen to 15% due to the elevation in cholesterol and progression to type 2 DM .Marland Kitchen.will recommend primary prevention   Including aspirin and statin if no improvement in 6 months    Lab Results  Component Value Date   CHOL 256 (H) 10/11/2018   HDL 58.60  10/11/2018   LDLCALC 174 (H) 10/11/2018   LDLDIRECT 167.0 04/21/2016   TRIG 116.0 10/11/2018   CHOLHDL 4 10/11/2018         Screening for colon cancer    She is due this year for colonoscopy. Referral made to [facility]      Encounter for general adult medical examination with abnormal findings    Annual comprehensive preventive exam was done as well as an evaluation and management of chronic conditions .  During the course of the visit the patient was educated and counseled about appropriate screening and preventive services including :  diabetes screening, lipid analysis with projected  10 year  risk for CAD , nutrition counseling, breast, cervical and colorectal cancer screening, and recommended immunizations.  Printed recommendations for health maintenance screenings was give      Obesity    I have addressed  BMI and recommended wt loss of 10% of body weigh over the next 6 months using a low glycemic index diet and regular exercise a minimum of 5 days per week.         Other Visit Diagnoses    Breast cancer screening    -  Primary   Relevant Orders   MM 3D SCREEN BREAST BILATERAL      I am  having Mary Chen maintain her ergocalciferol.  No orders of the defined types were placed in this encounter.   There are no discontinued medications.  Follow-up: No follow-ups on file.   Sherlene Shams, MD

## 2018-10-11 NOTE — Patient Instructions (Addendum)
Your annual mammogram has been ordered and is due after July 3   You are encouraged (required) to call to make your appointment at [place]  [phone]   Preventing Type 2 Diabetes Mellitus Type 2 diabetes (type 2 diabetes mellitus) is a long-term (chronic) disease that affects blood sugar (glucose) levels. Normally, a hormone called insulin allows glucose to enter cells in the body. The cells use glucose for energy. In type 2 diabetes, one or both of these problems may be present:  The body does not make enough insulin.  The body does not respond properly to insulin that it makes (insulin resistance). Insulin resistance or lack of insulin causes excess glucose to build up in the blood instead of going into cells. As a result, high blood glucose (hyperglycemia) develops, which can cause many complications. Being overweight or obese and having an inactive (sedentary) lifestyle can increase your risk for diabetes. Type 2 diabetes can be delayed or prevented by making certain nutrition and lifestyle changes. What nutrition changes can be made?   Eat healthy meals and snacks regularly. Keep a healthy snack with you for when you get hungry between meals, such as fruit or a handful of nuts.  Eat lean meats and proteins that are low in saturated fats, such as chicken, fish, egg whites, and beans. Avoid processed meats.  Eat plenty of fruits and vegetables and plenty of grains that have not been processed (whole grains). It is recommended that you eat: ? 1?2 cups of fruit every day. ? 2?3 cups of vegetables every day. ? 6?8 oz of whole grains every day, such as oats, whole wheat, bulgur, brown rice, quinoa, and millet.  Eat low-fat dairy products, such as milk, yogurt, and cheese.  Eat foods that contain healthy fats, such as nuts, avocado, olive oil, and canola oil.  Drink water throughout the day. Avoid drinks that contain added sugar, such as soda or sweet tea.  Follow instructions from  your health care provider about specific eating or drinking restrictions.  Control how much food you eat at a time (portion size). ? Check food labels to find out the serving sizes of foods. ? Use a kitchen scale to weigh amounts of foods.  Saute or steam food instead of frying it. Cook with water or broth instead of oils or butter.  Limit your intake of: ? Salt (sodium). Have no more than 1 tsp (2,400 mg) of sodium a day. If you have heart disease or high blood pressure, have less than ? tsp (1,500 mg) of sodium a day. ? Saturated fat. This is fat that is solid at room temperature, such as butter or fat on meat. What lifestyle changes can be made? Activity   Do moderate-intensity physical activity for at least 30 minutes on at least 5 days of the week, or as much as told by your health care provider.  Ask your health care provider what activities are safe for you. A mix of physical activities may be best, such as walking, swimming, cycling, and strength training.  Try to add physical activity into your day. For example: ? Park in spots that are farther away than usual, so that you walk more. For example, park in a far corner of the parking lot when you go to the office or the grocery store. ? Take a walk during your lunch break. ? Use stairs instead of elevators or escalators. Weight Loss  Lose weight as directed. Your health care provider can determine how  much weight loss is best for you and can help you lose weight safely.  If you are overweight or obese, you may be instructed to lose at least 5?7 % of your body weight. Alcohol and Tobacco   Limit alcohol intake to no more than 1 drink a day for nonpregnant women and 2 drinks a day for men. One drink equals 12 oz of beer, 5 oz of wine, or 1 oz of hard liquor.  Do not use any tobacco products, such as cigarettes, chewing tobacco, and e-cigarettes. If you need help quitting, ask your health care provider. Work With Your Health  Care Provider  Have your blood glucose tested regularly, as told by your health care provider.  Discuss your risk factors and how you can reduce your risk for diabetes.  Get screening tests as told by your health care provider. You may have screening tests regularly, especially if you have certain risk factors for type 2 diabetes.  Make an appointment with a diet and nutrition specialist (registered dietitian). A registered dietitian can help you make a healthy eating plan and can help you understand portion sizes and food labels. Why are these changes important?  It is possible to prevent or delay type 2 diabetes and related health problems by making lifestyle and nutrition changes.  It can be difficult to recognize signs of type 2 diabetes. The best way to avoid possible damage to your body is to take actions to prevent the disease before you develop symptoms. What can happen if changes are not made?  Your blood glucose levels may keep increasing. Having high blood glucose for a long time is dangerous. Too much glucose in your blood can damage your blood vessels, heart, kidneys, nerves, and eyes.  You may develop prediabetes or type 2 diabetes. Type 2 diabetes can lead to many chronic health problems and complications, such as: ? Heart disease. ? Stroke. ? Blindness. ? Kidney disease. ? Depression. ? Poor circulation in the feet and legs, which could lead to surgical removal (amputation) in severe cases. Where to find support  Ask your health care provider to recommend a registered dietitian, diabetes educator, or weight loss program.  Look for local or online weight loss groups.  Join a gym, fitness club, or outdoor activity group, such as a walking club. Where to find more information To learn more about diabetes and diabetes prevention, visit:  American Diabetes Association (ADA): www.diabetes.[employer]  National Institute of Diabetes and Digestive and Kidney Diseases:  ToyArt[employer].cawww.niddk.nih.gov/health-information/diabetes To learn more about healthy eating, visit:  The U.S. Department of Agriculture Architect(USDA), Choose My Plate: http://yates.biz/www.choosemyplate.gov/food-groups  Office of Disease Prevention and Health Promotion (ODPHP), Dietary Guidelines: ListingMagazine.siwww.health.gov/dietaryguidelines Summary  You can reduce your risk for type 2 diabetes by increasing your physical activity, eating healthy foods, and losing weight as directed.  Talk with your health care provider about your risk for type 2 diabetes. Ask about any blood tests or screening tests that you need to have. This information is not intended to replace advice given to you by your health care provider. Make sure you discuss any questions you have with your health care provider. Document Released: 08/25/2015 Document Revised: 04/14/2017 Document Reviewed: 06/24/2015 Elsevier Interactive Patient Education  2019 [employer]

## 2018-10-12 LAB — COMPREHENSIVE METABOLIC PANEL
ALT: 14 U/L (ref 0–35)
AST: 13 U/L (ref 0–37)
Albumin: 4 g/dL (ref 3.5–5.2)
Alkaline Phosphatase: 79 U/L (ref 39–117)
BUN: 18 mg/dL (ref 6–23)
CO2: 26 mEq/L (ref 19–32)
Calcium: 9.2 mg/dL (ref 8.4–10.5)
Chloride: 107 mEq/L (ref 96–112)
Creatinine, Ser: 0.83 mg/dL (ref 0.40–1.20)
GFR: 83.86 mL/min (ref >60.00)
Glucose, Bld: 80 mg/dL (ref 70–99)
Potassium: 4 mEq/L (ref 3.5–5.1)
Sodium: 143 mEq/L (ref 135–145)
Total Bilirubin: 0.7 mg/dL (ref 0.2–1.2)
Total Protein: 6.9 g/dL (ref 6.0–8.3)

## 2018-10-12 LAB — HEMOGLOBIN A1C: Hgb A1c MFr Bld: 6.5 % (ref 4.6–6.5)

## 2018-10-12 LAB — LIPID PANEL
Cholesterol: 256 mg/dL — ABNORMAL HIGH (ref 0–200)
HDL: 58.6 mg/dL (ref >39.00)
LDL Cholesterol: 174 mg/dL — ABNORMAL HIGH (ref 0–99)
NonHDL: 196.92
Total CHOL/HDL Ratio: 4
Triglycerides: 116 mg/dL (ref 0.0–149.0)
VLDL: 23.2 mg/dL (ref 0.0–40.0)

## 2018-10-12 LAB — TSH: TSH: 1.47 u[IU]/mL (ref 0.35–4.50)

## 2018-10-12 NOTE — Assessment & Plan Note (Signed)
She is due this year for colonoscopy. Referral made to [facility]

## 2018-10-12 NOTE — Assessment & Plan Note (Signed)
Annual comprehensive preventive exam was done as well as an evaluation and management of chronic conditions .  During the course of the visit the patient was educated and counseled about appropriate screening and preventive services including :  diabetes screening, lipid analysis with projected  10 year  risk for CAD , nutrition counseling, breast, cervical and colorectal cancer screening, and recommended immunizations.  Printed recommendations for health maintenance screenings was give 

## 2018-10-12 NOTE — Assessment & Plan Note (Signed)
Progression from prediabetes,  a1c now 6.5 .  counselling given. rtc 6 months

## 2018-10-12 NOTE — Assessment & Plan Note (Signed)
Using the Framingham risk calculator,  her 10 year risk of coronary artery disease has risen to 15% due to the elevation in cholesterol and progression to type 2 DM .Marland Kitchenwill recommend primary prevention   Including aspirin and statin if no improvement in 6 months    Lab Results  Component Value Date   CHOL 256 (H) 10/11/2018   HDL 58.60 10/11/2018   LDLCALC 174 (H) 10/11/2018   LDLDIRECT 167.0 04/21/2016   TRIG 116.0 10/11/2018   CHOLHDL 4 10/11/2018

## 2018-10-12 NOTE — Assessment & Plan Note (Signed)
I have addressed  BMI and recommended wt loss of 10% of body weigh over the next 6 months using a low glycemic index diet and regular exercise a minimum of 5 days per week.   

## 2018-10-30 ENCOUNTER — Encounter: Payer: Self-pay | Admitting: Internal Medicine

## 2018-12-02 ENCOUNTER — Emergency Department: Payer: 59

## 2018-12-02 ENCOUNTER — Inpatient Hospital Stay
Admission: EM | Admit: 2018-12-02 | Discharge: 2018-12-05 | DRG: 854 | Disposition: A | Discharge status: Home / Self Care | Payer: 59 | Attending: Internal Medicine | Admitting: Internal Medicine

## 2018-12-02 ENCOUNTER — Other Ambulatory Visit: Payer: Self-pay

## 2018-12-02 ENCOUNTER — Encounter: Payer: Self-pay | Admitting: Emergency Medicine

## 2018-12-02 DIAGNOSIS — E785 Hyperlipidemia, unspecified: Secondary | ICD-10-CM | POA: Diagnosis present

## 2018-12-02 DIAGNOSIS — A498 Other bacterial infections of unspecified site: Secondary | ICD-10-CM | POA: Diagnosis not present

## 2018-12-02 DIAGNOSIS — A419 Sepsis, unspecified organism: Secondary | ICD-10-CM | POA: Diagnosis not present

## 2018-12-02 DIAGNOSIS — E119 Type 2 diabetes mellitus without complications: Secondary | ICD-10-CM | POA: Diagnosis not present

## 2018-12-02 DIAGNOSIS — N201 Calculus of ureter: Secondary | ICD-10-CM | POA: Diagnosis not present

## 2018-12-02 DIAGNOSIS — Z801 Family history of malignant neoplasm of trachea, bronchus and lung: Secondary | ICD-10-CM

## 2018-12-02 DIAGNOSIS — K8020 Calculus of gallbladder without cholecystitis without obstruction: Secondary | ICD-10-CM | POA: Diagnosis present

## 2018-12-02 DIAGNOSIS — Z1611 Resistance to penicillins: Secondary | ICD-10-CM | POA: Diagnosis present

## 2018-12-02 DIAGNOSIS — Z803 Family history of malignant neoplasm of breast: Secondary | ICD-10-CM | POA: Diagnosis not present

## 2018-12-02 DIAGNOSIS — E559 Vitamin D deficiency, unspecified: Secondary | ICD-10-CM | POA: Diagnosis present

## 2018-12-02 DIAGNOSIS — Z6835 Body mass index (BMI) 35.0-35.9, adult: Secondary | ICD-10-CM | POA: Diagnosis not present

## 2018-12-02 DIAGNOSIS — N202 Calculus of kidney with calculus of ureter: Secondary | ICD-10-CM | POA: Diagnosis not present

## 2018-12-02 DIAGNOSIS — N200 Calculus of kidney: Secondary | ICD-10-CM

## 2018-12-02 DIAGNOSIS — Z90710 Acquired absence of both cervix and uterus: Secondary | ICD-10-CM | POA: Diagnosis not present

## 2018-12-02 DIAGNOSIS — Z90722 Acquired absence of ovaries, bilateral: Secondary | ICD-10-CM | POA: Diagnosis not present

## 2018-12-02 DIAGNOSIS — N132 Hydronephrosis with renal and ureteral calculous obstruction: Secondary | ICD-10-CM | POA: Diagnosis not present

## 2018-12-02 DIAGNOSIS — N139 Obstructive and reflux uropathy, unspecified: Secondary | ICD-10-CM | POA: Diagnosis present

## 2018-12-02 DIAGNOSIS — Z466 Encounter for fitting and adjustment of urinary device: Secondary | ICD-10-CM | POA: Diagnosis not present

## 2018-12-02 DIAGNOSIS — Z1159 Encounter for screening for other viral diseases: Secondary | ICD-10-CM | POA: Diagnosis not present

## 2018-12-02 DIAGNOSIS — E669 Obesity, unspecified: Secondary | ICD-10-CM | POA: Diagnosis present

## 2018-12-02 DIAGNOSIS — Z86711 Personal history of pulmonary embolism: Secondary | ICD-10-CM

## 2018-12-02 DIAGNOSIS — A4151 Sepsis due to Escherichia coli [E. coli]: Secondary | ICD-10-CM | POA: Diagnosis present

## 2018-12-02 DIAGNOSIS — Z20828 Contact with and (suspected) exposure to other viral communicable diseases: Secondary | ICD-10-CM | POA: Diagnosis not present

## 2018-12-02 DIAGNOSIS — Z419 Encounter for procedure for purposes other than remedying health state, unspecified: Secondary | ICD-10-CM

## 2018-12-02 DIAGNOSIS — Z818 Family history of other mental and behavioral disorders: Secondary | ICD-10-CM

## 2018-12-02 DIAGNOSIS — Z79899 Other long term (current) drug therapy: Secondary | ICD-10-CM

## 2018-12-02 LAB — CBC
HCT: 41.2 % (ref 36.0–46.0)
Hemoglobin: 12.8 g/dL (ref 12.0–15.0)
MCH: 21.8 pg — ABNORMAL LOW (ref 26.0–34.0)
MCHC: 31.1 g/dL (ref 30.0–36.0)
MCV: 70.3 fL — ABNORMAL LOW (ref 80.0–100.0)
Platelets: 219 10*3/uL (ref 150–400)
RBC: 5.86 MIL/uL — ABNORMAL HIGH (ref 3.87–5.11)
RDW: 16.7 % — ABNORMAL HIGH (ref 11.5–15.5)
WBC: 14.7 10*3/uL — ABNORMAL HIGH (ref 4.0–10.5)
nRBC: 0 % (ref 0.0–0.2)

## 2018-12-02 LAB — COMPREHENSIVE METABOLIC PANEL
ALT: 28 U/L (ref 0–44)
AST: 18 U/L (ref 15–41)
Albumin: 4 g/dL (ref 3.5–5.0)
Alkaline Phosphatase: 70 U/L (ref 38–126)
Anion gap: 13 (ref 5–15)
BUN: 22 mg/dL (ref 8–23)
CO2: 20 mmol/L — ABNORMAL LOW (ref 22–32)
Calcium: 9.1 mg/dL (ref 8.9–10.3)
Chloride: 105 mmol/L (ref 98–111)
Creatinine, Ser: 0.78 mg/dL (ref 0.44–1.00)
GFR calc Af Amer: >60 mL/min (ref >60)
GFR calc non Af Amer: >60 mL/min (ref >60)
Glucose, Bld: 151 mg/dL — ABNORMAL HIGH (ref 70–99)
Potassium: 3.7 mmol/L (ref 3.5–5.1)
Sodium: 138 mmol/L (ref 135–145)
Total Bilirubin: 2.3 mg/dL — ABNORMAL HIGH (ref 0.3–1.2)
Total Protein: 7.4 g/dL (ref 6.5–8.1)

## 2018-12-02 LAB — SARS CORONAVIRUS 2 BY RT PCR (HOSPITAL ORDER, PERFORMED IN ~~LOC~~ HOSPITAL LAB): SARS Coronavirus 2: NEGATIVE

## 2018-12-02 LAB — LIPASE, BLOOD: Lipase: 22 U/L (ref 11–51)

## 2018-12-02 MED ORDER — MAGNESIUM HYDROXIDE 400 MG/5ML PO SUSP: 30.0000 mL | Freq: Every day | ORAL | Status: DC | PRN

## 2018-12-02 MED ORDER — HYDROMORPHONE HCL 1 MG/ML IJ SOLN
0.5000 mg | Freq: Once | INTRAMUSCULAR | Status: AC
  Administered 2018-12-02: 0.5 mg via INTRAVENOUS
  Filled 2018-12-02: qty 1

## 2018-12-02 MED ORDER — SODIUM CHLORIDE 0.9 % IV BOLUS
1000.0000 mL | Freq: Once | INTRAVENOUS | Status: AC
  Administered 2018-12-02: 1000 mL via INTRAVENOUS

## 2018-12-02 MED ORDER — ACETAMINOPHEN 325 MG PO TABS
650.0000 mg | ORAL_TABLET | Freq: Four times a day (QID) | ORAL | Status: DC | PRN
  Administered 2018-12-03 – 2018-12-04 (×3): 650 mg via ORAL
  Filled 2018-12-02 (×3): qty 2

## 2018-12-02 MED ORDER — IOHEXOL 300 MG/ML  SOLN
100.0000 mL | Freq: Once | INTRAMUSCULAR | Status: AC | PRN
  Administered 2018-12-02: 100 mL via INTRAVENOUS

## 2018-12-02 MED ORDER — ONDANSETRON HCL 4 MG PO TABS: 4.0000 mg | ORAL_TABLET | Freq: Four times a day (QID) | ORAL | Status: DC | PRN

## 2018-12-02 MED ORDER — ACETAMINOPHEN 325 MG PO TABS
650.0000 mg | ORAL_TABLET | Freq: Once | ORAL | Status: AC
  Administered 2018-12-02: 19:00:00 650 mg via ORAL
  Filled 2018-12-02: qty 2

## 2018-12-02 MED ORDER — KETOROLAC TROMETHAMINE 15 MG/ML IJ SOLN: 15.0000 mg | Freq: Four times a day (QID) | INTRAMUSCULAR | Status: DC | PRN

## 2018-12-02 MED ORDER — SODIUM CHLORIDE 0.9 % IV SOLN
INTRAVENOUS | Status: DC
  Administered 2018-12-03 – 2018-12-04 (×4): via INTRAVENOUS

## 2018-12-02 MED ORDER — ONDANSETRON HCL 4 MG/2ML IJ SOLN: 4.0000 mg | Freq: Four times a day (QID) | INTRAMUSCULAR | Status: DC | PRN

## 2018-12-02 MED ORDER — PIPERACILLIN-TAZOBACTAM 3.375 G IVPB 30 MIN
3.3750 g | Freq: Once | INTRAVENOUS | Status: AC
  Administered 2018-12-02: 3.375 g via INTRAVENOUS
  Filled 2018-12-02: qty 50

## 2018-12-02 MED ORDER — SODIUM CHLORIDE 0.9% FLUSH: 3.0000 mL | Freq: Once | INTRAVENOUS | Status: DC

## 2018-12-02 MED ORDER — ACETAMINOPHEN 650 MG RE SUPP
650.0000 mg | Freq: Four times a day (QID) | RECTAL | Status: DC | PRN
  Filled 2018-12-02: qty 1

## 2018-12-02 MED ORDER — VITAMIN D (ERGOCALCIFEROL) 1.25 MG (50000 UNIT) PO CAPS
50000.0000 [IU] | ORAL_CAPSULE | ORAL | Status: DC
  Administered 2018-12-05: 09:00:00 50000 [IU] via ORAL
  Filled 2018-12-02: qty 1

## 2018-12-02 MED ORDER — PIPERACILLIN-TAZOBACTAM 3.375 G IVPB 30 MIN: 3.3750 g | Freq: Four times a day (QID) | INTRAVENOUS | Status: DC

## 2018-12-02 MED ORDER — FENTANYL CITRATE (PF) 100 MCG/2ML IJ SOLN
50.0000 ug | INTRAMUSCULAR | Status: AC | PRN
  Administered 2018-12-02 (×2): 50 ug via INTRAVENOUS
  Filled 2018-12-02 (×2): qty 2

## 2018-12-02 MED ORDER — ONDANSETRON HCL 4 MG/2ML IJ SOLN
4.0000 mg | Freq: Once | INTRAMUSCULAR | Status: AC
  Administered 2018-12-02: 4 mg via INTRAVENOUS
  Filled 2018-12-02: qty 2

## 2018-12-02 MED ORDER — ENOXAPARIN SODIUM 40 MG/0.4ML ~~LOC~~ SOLN
40.0000 mg | SUBCUTANEOUS | Status: DC
  Administered 2018-12-03 – 2018-12-04 (×2): 40 mg via SUBCUTANEOUS
  Filled 2018-12-02 (×2): qty 0.4

## 2018-12-02 NOTE — ED Notes (Signed)
Pt placed on 4 L [place]  

## 2018-12-02 NOTE — ED Notes (Signed)
Patient has several blankets on. Most blankets were removed.

## 2018-12-02 NOTE — ED Provider Notes (Signed)
[facility] Emergency Department Provider Note  ____________________________________________   First MD Initiated Contact with Patient 12/02/18 1925     (approximate)  I have reviewed the triage vital signs and the nursing notes.   HISTORY  Chief Complaint Abdominal Pain    HPI Mary Chen is a 63 y.o. female with prior PE, ovarian cyst who presents with abdominal pain.  Patient had left lower quadrant abdominal pain that started this morning.  The pain is severe, intermittent, nothing makes it better, nothing makes it worse.  Has been associated multiple episodes of vomiting.  No eye symptoms, chest pain, shortness of breath.  She says she got some lower back tenderness as well denies any prior surgeries on her back.  Patient does endorse a history of PE however she is not currently taking any blood thinners.  It was postoperative in nature.  Patient denies any shortness of breath or chest pain.     Past Medical History:  Diagnosis Date   Fibrocystic disease of breast    Hx pulmonary embolism Nov 2009   postoperative, negaive Factor V Leuiden, neg LE ultrasound.    Ovarian cyst     Patient Active Problem List   Diagnosis Date Noted   Obesity 04/22/2016   Vitamin D deficiency 04/24/2015   S/P total hysterectomy and bilateral salpingo-oophorectomy 04/23/2013   Microcytosis 06/23/2012   Encounter for general adult medical examination with abnormal findings 06/21/2012   Diabetes mellitus type II, controlled, with no complications ([place]) 01/60/1093   Hyperlipidemia LDL goal <160 04/05/2011   Screening for breast cancer 04/05/2011   Screening for colon cancer 04/05/2011   Hx pulmonary embolism    Benign colonic polyp     Past Surgical History:  Procedure Laterality Date   ABDOMINAL HYSTERECTOMY     BREAST BIOPSY Left    neg   BREAST EXCISIONAL BIOPSY Left    neg   TOTAL ABDOMINAL HYSTERECTOMY W/ BILATERAL  SALPINGOOPHORECTOMY  2009   for bilateral ovarian cysts and fibroid uterus    Prior to Admission medications   Medication Sig Start Date End Date Taking? Authorizing Provider  ergocalciferol (DRISDOL) 1.25 MG (50000 UT) capsule Take 1 capsule (50,000 Units total) by mouth once a week. 04/04/18   Crecencio Mc, MD    Allergies Patient has no known allergies.  Family History  Problem Relation Age of Onset   Cancer Mother        breast   Breast cancer Mother 75   Bipolar disorder Brother        self inficted GSW    Cancer Father 33       lung Ca mets to liver     Social History Social History   Tobacco Use   Smoking status: Never Smoker   Smokeless tobacco: Never Used  Substance Use Topics   Alcohol use: No   Drug use: No      Review of Systems Constitutional: No fever/chills Eyes: No visual changes. ENT: No sore throat. Cardiovascular: Denies chest pain. Respiratory: Denies shortness of breath. Gastrointestinal: Positive for abdominal pain, positive for vomiting no diarrhea.  No constipation. Genitourinary: Negative for dysuria. Musculoskeletal:   Positive for lower back pain. Skin: Negative for rash. Neurological: Negative for headaches, focal weakness or numbness. All other ROS negative ____________________________________________   PHYSICAL EXAM:  VITAL SIGNS: ED Triage Vitals  Enc Vitals Group     BP 12/02/18 1747 (!) 119/48     Pulse Rate 12/02/18 1747 Marland Kitchen)  106     Resp 12/02/18 1747 20     Temp 12/02/18 1747 100.1 F (37.8 C)     Temp Source 12/02/18 1747 Oral     SpO2 12/02/18 1747 98 %     Weight 12/02/18 1759 220 lb (99.8 kg)     Height 12/02/18 1759 5\' 6"  (1.676 m)     Head Circumference --      Peak Flow --      Pain Score 12/02/18 1759 10     Pain Loc --      Pain Edu? --      Excl. in GC? --     Constitutional: Alert and oriented. Well appearing and in no acute distress. Eyes: Conjunctivae are normal. EOMI. Head:  Atraumatic. Nose: No congestion/rhinnorhea. Mouth/Throat: Mucous membranes are moist.   Neck: No stridor. Trachea Midline. FROM Cardiovascular: Tachycardic regular rhythm. Grossly normal heart sounds.  Good peripheral circulation. Respiratory: Normal respiratory effort.  No retractions. Lungs CTAB. Gastrointestinal: Left lower quadrant tenderness. No distention. No abdominal bruits.  Musculoskeletal: No lower extremity tenderness nor edema.  No joint effusions. Neurologic:  Normal speech and language. No gross focal neurologic deficits are appreciated.  Skin:  Skin is warm, dry and intact. No rash noted. Psychiatric: Mood and affect are normal. Speech and behavior are normal. GU: Deferred   ____________________________________________   LABS (all labs ordered are listed, but only abnormal results are displayed)  Labs Reviewed  COMPREHENSIVE METABOLIC PANEL - Abnormal; Notable for the following components:      Result Value   CO2 20 (*)    Glucose, Bld 151 (*)    Total Bilirubin 2.3 (*)    All other components within normal limits  CBC - Abnormal; Notable for the following components:   WBC 14.7 (*)    RBC 5.86 (*)    MCV 70.3 (*)    MCH 21.8 (*)    RDW 16.7 (*)    All other components within normal limits  SARS CORONAVIRUS 2 (HOSPITAL ORDER, PERFORMED IN [facility])  CULTURE, BLOOD (ROUTINE X 2)  CULTURE, BLOOD (ROUTINE X 2)  LIPASE, BLOOD  URINALYSIS, COMPLETE (UACMP) WITH MICROSCOPIC  LACTIC ACID, PLASMA  LACTIC ACID, PLASMA   ____________________________________________   ED ECG REPORT I, Concha SeMary E Aiysha Jillson, the attending physician, personally viewed and interpreted this ECG.  EKG sinus tachycardia, no ST elevation, no T wave inversion, normal intervals. ____________________________________________  RADIOLOGY Vela ProseI, Robbi Spells E Yarnell Arvidson, personally viewed and evaluated these images (plain radiographs) as part of my medical decision making, as well as reviewing the  written report by the radiologist.  ED MD interpretation:    Official radiology report(s): Ct Abdomen Pelvis W Contrast  Result Date: 12/02/2018 CLINICAL DATA:  Left lower quadrant abdominal pain. EXAM: CT ABDOMEN AND PELVIS WITH CONTRAST TECHNIQUE: Multidetector CT imaging of the abdomen and pelvis was performed using the standard protocol following bolus administration of intravenous contrast. CONTRAST:  100mL OMNIPAQUE IOHEXOL 300 MG/ML  SOLN COMPARISON:  None. FINDINGS: Lower chest: There is a trace right-sided pleural effusion.The heart size is normal. Hepatobiliary: There are multiple presumed hepatic cysts throughout the left and right hepatic lobes. Many of these are too small to characterize. Cholelithiasis without acute inflammation.There is no biliary ductal dilation. Pancreas: Normal contours without ductal dilatation. No peripancreatic fluid collection. Spleen: No splenic laceration or hematoma. Adrenals/Urinary Tract: --Adrenal glands: No adrenal hemorrhage. --Right kidney/ureter: There are multiple nonobstructing stones throughout the right kidney measuring up to approximately  5 mm. There is no right-sided hydronephrosis. --Left kidney/ureter: There is mild to moderate left-sided hydroureteronephrosis secondary to an obstructing 6 mm stone at the left UVJ. There are multiple additional nonobstructing stones throughout the left kidney. --Urinary bladder: Unremarkable. Stomach/Bowel: --Stomach/Duodenum: There is a small hiatal hernia. There is a posterior gastric diverticulum. --Small bowel: No dilatation or inflammation. --Colon: No focal abnormality. --Appendix: Normal. Vascular/Lymphatic: Normal course and caliber of the major abdominal vessels. --No retroperitoneal lymphadenopathy. --No mesenteric lymphadenopathy. --No pelvic or inguinal lymphadenopathy. Reproductive: Unremarkable Other: No ascites or free air. The abdominal wall is normal. Musculoskeletal. No acute displaced fractures.  IMPRESSION: 1. Mild to moderate left-sided hydroureteronephrosis secondary to an obstructing 6 mm stone in the distal left ureter at the left UVJ. 2. Bilateral nephrolithiasis. 3. Probable cholelithiasis without CT evidence for acute cholecystitis. 4. Trace right-sided pleural effusion. Electronically Signed   By: Katherine Mantlehristopher  Green M.D.   On: 12/02/2018 22:19    ____________________________________________   PROCEDURES  Procedure(s) performed (including Critical Care):  .Critical Care Performed by: Concha SeFunke, Nahom Carfagno E, MD Authorized by: Concha SeFunke, Lilyth Lawyer E, MD   Critical care provider statement:    Critical care time (minutes):  45   Critical care was necessary to treat or prevent imminent or life-threatening deterioration of the following conditions:  Sepsis (obstructed, infected kidney stone )   Critical care was time spent personally by me on the following activities:  Blood draw for specimens, development of treatment plan with patient or surrogate, discussions with consultants, evaluation of patient's response to treatment, examination of patient, ordering and performing treatments and interventions, ordering and review of laboratory studies, ordering and review of radiographic studies, pulse oximetry, re-evaluation of patient's condition, review of old charts and obtaining history from patient or surrogate     ____________________________________________   INITIAL IMPRESSION / ASSESSMENT AND PLAN / ED COURSE  Mary Chen was evaluated in Emergency Department on 12/02/2018 for the symptoms described in the history of present illness. She was evaluated in the context of the global COVID-19 pandemic, which necessitated consideration that the patient might be at risk for infection with the SARS-CoV-2 virus that causes COVID-19. Institutional protocols and algorithms that pertain to the evaluation of patients at risk for COVID-19 are in a state of rapid change based on information released by  regulatory bodies including the CDC and federal and state organizations. These policies and algorithms were followed during the patient's care in the ED.    Patient is febrile and tachycardic with left lower quadrant abdominal pain.  .  Will get CT scan to evaluate for diverticulitis, appendicitis, SBO, abscess, infected stone.  Will get labs to evaluate for electrolyte dehydration. We will test for coronavirus.  Lipase normal low suspicion for pancreatitis.  White count elevated at 14.7 concerning for infection.  Clinical Course as of Dec 02 2247  Sat Dec 02, 2018  2124 CT ABDOMEN PELVIS W CONTRAST [MF]    Clinical Course User Index [MF] Concha SeFunke, Kailan Carmen E, MD    White count is elevated from 15.7.  CT scan is concerning for mild to moderate left-sided hydroureteronephrosis secondary to an obstructing 6 mm stone at the left UVJ.  Patient has not given a UA yet but I concerned about a septic obstructing stone.  Sepsis alert called.  Patient started on Zosyn.  Paged urology.  Dr. Lonna CobbStoioff. D/w with urology.  Plan to take patient to the OR for stent placement.  D.w hospital team they will admit patient.   ____________________________________________  FINAL CLINICAL IMPRESSION(S) / ED DIAGNOSES   Final diagnoses:  Kidney stone on left side  Sepsis, due to unspecified organism, unspecified whether acute organ dysfunction present [facility](HCC)      MEDICATIONS GIVEN DURING THIS VISIT:  Medications  sodium chloride flush (NS) 0.9 % injection 3 mL (3 mLs Intravenous Not Given 12/02/18 1948)  piperacillin-tazobactam (ZOSYN) IVPB 3.375 g (3.375 g Intravenous New Bag/Given 12/02/18 2348)  ergocalciferol (VITAMIN D2) capsule 50,000 Units (has no administration in time range)  enoxaparin (LOVENOX) injection 40 mg (has no administration in time range)  0.9 %  sodium chloride infusion (has no administration in time range)  acetaminophen (TYLENOL) tablet 650 mg (has no administration in time range)     Or  acetaminophen (TYLENOL) suppository 650 mg (has no administration in time range)  ketorolac (TORADOL) 15 MG/ML injection 15 mg (has no administration in time range)  magnesium hydroxide (MILK OF MAGNESIA) suspension 30 mL (has no administration in time range)  ondansetron (ZOFRAN) tablet 4 mg (has no administration in time range)    Or  ondansetron (ZOFRAN) injection 4 mg (has no administration in time range)  piperacillin-tazobactam (ZOSYN) IVPB 3.375 g (has no administration in time range)  fentaNYL (SUBLIMAZE) injection 50 mcg (50 mcg Intravenous Given 12/02/18 2338)  acetaminophen (TYLENOL) tablet 650 mg (650 mg Oral Given 12/02/18 1847)  ondansetron (ZOFRAN) injection 4 mg (4 mg Intravenous Given 12/02/18 1847)  sodium chloride 0.9 % bolus 1,000 mL (0 mLs Intravenous Stopped 12/02/18 2113)  sodium chloride 0.9 % bolus 1,000 mL (1,000 mLs Intravenous New Bag/Given 12/02/18 2114)  iohexol (OMNIPAQUE) 300 MG/ML solution 100 mL (100 mLs Intravenous Contrast Given 12/02/18 2158)  HYDROmorphone (DILAUDID) injection 0.5 mg (0.5 mg Intravenous Given 12/02/18 2338)     ED Discharge Orders    None       Note:  This document was prepared using Dragon voice recognition software and may include unintentional dictation errors.   Concha SeFunke, Bevelyn Arriola E, MD 12/02/18 2356

## 2018-12-02 NOTE — ED Notes (Addendum)
Pt's sister called this RN two times to get update on pt's status and was informed  both times that she would be notified when we had more information to give. During the first call Sister was informed that this RN was in another room with a critical pt and that another RN was giving fluids and meds to the patient and that a CT had been ordered. The pt's sister then called again and was informed that the CT had not been performed yet. This RN spoke with pt's daughter who is with pt's sister at this time (23:10) and informed her that CT had resulted and pt would be receiving IV antibiotics and would be admitted for diagnosis. Secretary informed this RN that pt's family members also called several times prior to the pt being roomed.

## 2018-12-02 NOTE — Consult Note (Signed)
Urology Consult   Chief Complaint: Abdominal pain  History of Present Illness: Mary Chen is a 63 y.o. year old female seen in consultation at the request of Dr. Jari Pigg for evaluation of an obstructing left ureteral stone with sepsis.  She had acute onset this morning of left lower quadrant abdominal pain radiating to the left flank.  She had associated nausea and vomiting and subsequently developed fever today.  She presented to the ED for worsening pain.  No identifiable precipitating, aggravating or alleviating factors to her pain.  Intensity was rated severe.  She denies urinary frequency, urgency or dysuria.  Initial temp in the ED was 100.1.  She spiked a temp to 103.2 at [employer].  A stone protocol CT of the abdomen pelvis was performed which showed a 6 mm left distal ureteral calculus with left hydronephrosis/hydroureter.  She also had bilateral nonobstructing renal calculi and cholelithiasis without evidence of cholecystitis.  She denies previous history of stone disease.  She was unable to obtain a urine in the ED.  Lactate level returned 2.4.  Coronavirus testing was negative.   Past Medical History:  Diagnosis Date   Fibrocystic disease of breast    Hx pulmonary embolism Nov 2009   postoperative, negaive Factor V Leuiden, neg LE ultrasound.    Ovarian cyst     Past Surgical History:  Procedure Laterality Date   ABDOMINAL HYSTERECTOMY     BREAST BIOPSY Left    neg   BREAST EXCISIONAL BIOPSY Left    neg   TOTAL ABDOMINAL HYSTERECTOMY W/ BILATERAL SALPINGOOPHORECTOMY  2009   for bilateral ovarian cysts and fibroid uterus    Home Medications:  No outpatient medications have been marked as taking for the 12/02/18 encounter [facility] Encounter).    Allergies: No Known Allergies  Family History  Problem Relation Age of Onset   Cancer Mother        breast   Breast cancer Mother 41   Bipolar disorder Brother        self inficted GSW    Cancer Father 49         lung Ca mets to liver     Social History:  reports that she has never smoked. She has never used smokeless tobacco. She reports that she does not drink alcohol or use drugs.  ROS: A complete review of systems was performed.  All systems are negative except for pertinent findings as noted.  Physical Exam:  Vital signs in last 24 hours: Temp:  [100.1 F (37.8 C)-103.2 F (39.6 C)] 103.2 F (39.6 C) (07/18 1956) Pulse Rate:  [106-126] 122 (07/18 2000) Resp:  [16-20] 16 (07/18 1956) BP: (108-137)/(48-80) 137/80 (07/18 2000) SpO2:  [93 %-98 %] 94 % (07/18 2000) Weight:  [99.8 kg] 99.8 kg (07/18 1759) Constitutional:  Alert and oriented, No acute distress HEENT: [place] AT, moist mucus membranes.  Trachea midline, no masses Cardiovascular: Regular rate and rhythm, no clubbing, cyanosis, or edema. Respiratory: Normal respiratory effort, lungs clear bilaterally GI: Abdomen is soft, positive left lower quadrant tenderness, nondistended, no abdominal masses GU: Positive left CVA tenderness Skin: No rashes, bruises or suspicious lesions Lymph: No cervical or inguinal adenopathy Neurologic: Grossly intact, no focal deficits, moving all 4 extremities Psychiatric: Normal mood and affect   Laboratory Data:  Recent Labs    12/02/18 1751  WBC 14.7*  HGB 12.8  HCT 41.2   Recent Labs    12/02/18 1751  NA 138  K 3.7  CL 105  CO2 20*  GLUCOSE 151*  BUN 22  CREATININE 0.78  CALCIUM 9.1   No results for input(s): LABPT, INR in the last 72 hours. No results for input(s): LABURIN in the last 72 hours. Results for orders placed or performed during the hospital encounter of 12/02/18  SARS Coronavirus 2 (CEPHEID - Performed in [facility] hospital lab), Hosp Order     Status: None   Collection Time: 12/02/18  8:25 PM   Specimen: Nasopharyngeal Swab  Result Value Ref Range Status   SARS Coronavirus 2 NEGATIVE NEGATIVE Final    Comment: (NOTE) If result is NEGATIVE SARS-CoV-2 target  nucleic acids are NOT DETECTED. The SARS-CoV-2 RNA is generally detectable in upper and lower  respiratory specimens during the acute phase of infection. The lowest  concentration of SARS-CoV-2 viral copies this assay can detect is 250  copies / mL. A negative result does not preclude SARS-CoV-2 infection  and should not be used as the sole basis for treatment or other  patient management decisions.  A negative result may occur with  improper specimen collection / handling, submission of specimen other  than nasopharyngeal swab, presence of viral mutation(s) within the  areas targeted by this assay, and inadequate number of viral copies  (<250 copies / mL). A negative result must be combined with clinical  observations, patient history, and epidemiological information. If result is POSITIVE SARS-CoV-2 target nucleic acids are DETECTED. The SARS-CoV-2 RNA is generally detectable in upper and lower  respiratory specimens dur ing the acute phase of infection.  Positive  results are indicative of active infection with SARS-CoV-2.  Clinical  correlation with patient history and other diagnostic information is  necessary to determine patient infection status.  Positive results do  not rule out bacterial infection or co-infection with other viruses. If result is PRESUMPTIVE POSTIVE SARS-CoV-2 nucleic acids MAY BE PRESENT.   A presumptive positive result was obtained on the submitted specimen  and confirmed on repeat testing.  While 2019 novel coronavirus  (SARS-CoV-2) nucleic acids may be present in the submitted sample  additional confirmatory testing may be necessary for epidemiological  and / or clinical management purposes  to differentiate between  SARS-CoV-2 and other Sarbecovirus currently known to infect humans.  If clinically indicated additional testing with an alternate test  methodology [phone]([id number]) is advised. The SARS-CoV-2 RNA is generally  detectable in upper and lower  respiratory sp ecimens during the acute  phase of infection. The expected result is Negative. Fact Sheet for Patients:  BoilerBrush.com.cyhttps://www.fda.gov/media/136312/download Fact Sheet for Healthcare Providers: https://pope.com/https://www.fda.gov/media/136313/download This test is not yet approved or cleared by the [place] FDA and has been authorized for detection and/or diagnosis of SARS-CoV-2 by FDA under an Emergency Use Authorization (EUA).  This EUA will remain in effect (meaning this test can be used) for the duration of the COVID-19 declaration under Section 564(b)(1) of the Act, 21 U.S.C. section 360bbb-3(b)(1), unless the authorization is terminated or revoked sooner. Performed at [facility], [address] [place], [place] [address]      Radiologic Imaging: CT was personally reviewed Ct Abdomen Pelvis W Contrast  Result Date: 12/02/2018 CLINICAL DATA:  Left lower quadrant abdominal pain. EXAM: CT ABDOMEN AND PELVIS WITH CONTRAST TECHNIQUE: Multidetector CT imaging of the abdomen and pelvis was performed using the standard protocol following bolus administration of intravenous contrast. CONTRAST:  100mL OMNIPAQUE IOHEXOL 300 MG/ML  SOLN COMPARISON:  None. FINDINGS: Lower chest: There is a trace right-sided pleural  effusion.The heart size is normal. Hepatobiliary: There are multiple presumed hepatic cysts throughout the left and right hepatic lobes. Many of these are too small to characterize. Cholelithiasis without acute inflammation.There is no biliary ductal dilation. Pancreas: Normal contours without ductal dilatation. No peripancreatic fluid collection. Spleen: No splenic laceration or hematoma. Adrenals/Urinary Tract: --Adrenal glands: No adrenal hemorrhage. --Right kidney/ureter: There are multiple nonobstructing stones throughout the right kidney measuring up to approximately 5 mm. There is no right-sided hydronephrosis. --Left kidney/ureter: There is mild to moderate left-sided  hydroureteronephrosis secondary to an obstructing 6 mm stone at the left UVJ. There are multiple additional nonobstructing stones throughout the left kidney. --Urinary bladder: Unremarkable. Stomach/Bowel: --Stomach/Duodenum: There is a small hiatal hernia. There is a posterior gastric diverticulum. --Small bowel: No dilatation or inflammation. --Colon: No focal abnormality. --Appendix: Normal. Vascular/Lymphatic: Normal course and caliber of the major abdominal vessels. --No retroperitoneal lymphadenopathy. --No mesenteric lymphadenopathy. --No pelvic or inguinal lymphadenopathy. Reproductive: Unremarkable Other: No ascites or free air. The abdominal wall is normal. Musculoskeletal. No acute displaced fractures. IMPRESSION: 1. Mild to moderate left-sided hydroureteronephrosis secondary to an obstructing 6 mm stone in the distal left ureter at the left UVJ. 2. Bilateral nephrolithiasis. 3. Probable cholelithiasis without CT evidence for acute cholecystitis. 4. Trace right-sided pleural effusion. Electronically Signed   By: Katherine Mantlehristopher  Green M.D.   On: 12/02/2018 22:19    Impression/Assessment:  63 year old female with a 6 mm left distal ureteral calculus with fever and clinical sepsis.  Plan:  Urgent cystoscopy with placement left ureteral stent.  The procedure was discussed in detail with Ms. Vercher.  Potential risks were discussed including bleeding, worsening sepsis as well as risks of anesthesia.  It was stressed that there will be no attempt to remove her stone due to high risk of bacteremia and worsening sepsis.  She was informed she will need definitive stone treatment at a later date once her infection has cleared.  She indicated all questions were answered to her satisfaction and desires to proceed.  12/02/2018, 11:40 PM  Irineo AxonScott Abdoul Encinas,  MD

## 2018-12-02 NOTE — ED Triage Notes (Signed)
Lower L abdominal pain began this am. Vomited repeatedly. No diarrhea. No burning with urination.

## 2018-12-02 NOTE — Progress Notes (Addendum)
CODE SEPSIS - PHARMACY COMMUNICATION  **Broad Spectrum Antibiotics should be administered within 1 hour of Sepsis diagnosis**  Time Code Sepsis Called/Page Received: 2301  Antibiotics Ordered: zosyn  Time of 1st antibiotic administration: 2348  Additional action taken by pharmacy:   If necessary, Name of Provider/Nurse Contacted:     Tobie Lords ,PharmD Clinical Pharmacist  12/02/2018  11:53 PM

## 2018-12-03 ENCOUNTER — Inpatient Hospital Stay: Payer: 59 | Admitting: Anesthesiology

## 2018-12-03 ENCOUNTER — Inpatient Hospital Stay: Payer: 59

## 2018-12-03 ENCOUNTER — Other Ambulatory Visit: Payer: Self-pay

## 2018-12-03 ENCOUNTER — Encounter
Admission: EM | Disposition: A | Discharge status: Home / Self Care | Payer: Self-pay | Source: Home / Self Care | Attending: Internal Medicine

## 2018-12-03 DIAGNOSIS — N132 Hydronephrosis with renal and ureteral calculous obstruction: Secondary | ICD-10-CM

## 2018-12-03 DIAGNOSIS — A419 Sepsis, unspecified organism: Secondary | ICD-10-CM

## 2018-12-03 HISTORY — PX: CYSTOSCOPY WITH STENT PLACEMENT: SHX5790

## 2018-12-03 LAB — CBC
HCT: 36.2 % (ref 36.0–46.0)
Hemoglobin: 11.4 g/dL — ABNORMAL LOW (ref 12.0–15.0)
MCH: 22 pg — ABNORMAL LOW (ref 26.0–34.0)
MCHC: 31.5 g/dL (ref 30.0–36.0)
MCV: 69.7 fL — ABNORMAL LOW (ref 80.0–100.0)
Platelets: 140 10*3/uL — ABNORMAL LOW (ref 150–400)
RBC: 5.19 MIL/uL — ABNORMAL HIGH (ref 3.87–5.11)
RDW: 16 % — ABNORMAL HIGH (ref 11.5–15.5)
WBC: 11 10*3/uL — ABNORMAL HIGH (ref 4.0–10.5)
nRBC: 0 % (ref 0.0–0.2)

## 2018-12-03 LAB — URINALYSIS, COMPLETE (UACMP) WITH MICROSCOPIC
Bilirubin Urine: NEGATIVE
Glucose, UA: NEGATIVE mg/dL
Ketones, ur: NEGATIVE mg/dL
Nitrite: NEGATIVE
Protein, ur: 30 mg/dL — AB
RBC / HPF: >50 RBC/hpf — ABNORMAL HIGH (ref 0–5)
Specific Gravity, Urine: 1.04 — ABNORMAL HIGH (ref 1.005–1.030)
Squamous Epithelial / HPF: NONE SEEN (ref 0–5)
WBC, UA: >50 WBC/hpf — ABNORMAL HIGH (ref 0–5)
pH: 5 (ref 5.0–8.0)

## 2018-12-03 LAB — BASIC METABOLIC PANEL
Anion gap: 9 (ref 5–15)
BUN: 22 mg/dL (ref 8–23)
CO2: 20 mmol/L — ABNORMAL LOW (ref 22–32)
Calcium: 7.8 mg/dL — ABNORMAL LOW (ref 8.9–10.3)
Chloride: 111 mmol/L (ref 98–111)
Creatinine, Ser: 1.14 mg/dL — ABNORMAL HIGH (ref 0.44–1.00)
GFR calc Af Amer: 59 mL/min — ABNORMAL LOW (ref >60)
GFR calc non Af Amer: 51 mL/min — ABNORMAL LOW (ref >60)
Glucose, Bld: 152 mg/dL — ABNORMAL HIGH (ref 70–99)
Potassium: 3 mmol/L — ABNORMAL LOW (ref 3.5–5.1)
Sodium: 140 mmol/L (ref 135–145)

## 2018-12-03 LAB — BLOOD CULTURE ID PANEL (REFLEXED)

## 2018-12-03 LAB — LACTIC ACID, PLASMA
Lactic Acid, Venous: 1.7 mmol/L (ref 0.5–1.9)
Lactic Acid, Venous: 2.3 mmol/L (ref 0.5–1.9)
Lactic Acid, Venous: 2.4 mmol/L (ref 0.5–1.9)

## 2018-12-03 SURGERY — CYSTOSCOPY, WITH STENT INSERTION
Anesthesia: General | Laterality: Left

## 2018-12-03 MED ORDER — ACETAMINOPHEN 10 MG/ML IV SOLN
INTRAVENOUS | Status: DC | PRN
  Administered 2018-12-03: 1000 mg via INTRAVENOUS

## 2018-12-03 MED ORDER — FENTANYL CITRATE (PF) 100 MCG/2ML IJ SOLN
INTRAMUSCULAR | Status: DC | PRN
  Administered 2018-12-03: 50 ug via INTRAVENOUS

## 2018-12-03 MED ORDER — LACTATED RINGERS IV SOLN
INTRAVENOUS | Status: DC | PRN
  Administered 2018-12-03: 01:00:00 via INTRAVENOUS

## 2018-12-03 MED ORDER — SUGAMMADEX SODIUM 200 MG/2ML IV SOLN
INTRAVENOUS | Status: DC | PRN
  Administered 2018-12-03: 199.6 mg via INTRAVENOUS

## 2018-12-03 MED ORDER — PROPOFOL 10 MG/ML IV BOLUS
INTRAVENOUS | Status: AC
  Filled 2018-12-03: qty 20

## 2018-12-03 MED ORDER — SUCCINYLCHOLINE CHLORIDE 20 MG/ML IJ SOLN
INTRAMUSCULAR | Status: AC
  Filled 2018-12-03: qty 1

## 2018-12-03 MED ORDER — IPRATROPIUM-ALBUTEROL 0.5-2.5 (3) MG/3ML IN SOLN: 3.0000 mL | RESPIRATORY_TRACT | Status: DC

## 2018-12-03 MED ORDER — FENTANYL CITRATE (PF) 100 MCG/2ML IJ SOLN
INTRAMUSCULAR | Status: AC
  Filled 2018-12-03: qty 2

## 2018-12-03 MED ORDER — ONDANSETRON HCL 4 MG/2ML IJ SOLN
INTRAMUSCULAR | Status: DC | PRN
  Administered 2018-12-03: 4 mg via INTRAVENOUS

## 2018-12-03 MED ORDER — SODIUM CHLORIDE 0.9 % IV SOLN
1.0000 g | Freq: Three times a day (TID) | INTRAVENOUS | Status: DC
  Administered 2018-12-03 – 2018-12-05 (×6): 1 g via INTRAVENOUS
  Filled 2018-12-03 (×8): qty 1

## 2018-12-03 MED ORDER — IPRATROPIUM-ALBUTEROL 0.5-2.5 (3) MG/3ML IN SOLN
3.0000 mL | Freq: Once | RESPIRATORY_TRACT | Status: AC
  Administered 2018-12-03: 3 mL via RESPIRATORY_TRACT

## 2018-12-03 MED ORDER — FENTANYL CITRATE (PF) 100 MCG/2ML IJ SOLN: 25.0000 ug | INTRAMUSCULAR | Status: DC | PRN

## 2018-12-03 MED ORDER — IPRATROPIUM-ALBUTEROL 0.5-2.5 (3) MG/3ML IN SOLN
RESPIRATORY_TRACT | Status: AC
  Filled 2018-12-03: qty 3

## 2018-12-03 MED ORDER — ACETAMINOPHEN 10 MG/ML IV SOLN
INTRAVENOUS | Status: AC
  Filled 2018-12-03: qty 100

## 2018-12-03 MED ORDER — PHENYLEPHRINE HCL (PRESSORS) 10 MG/ML IV SOLN
INTRAVENOUS | Status: DC | PRN
  Administered 2018-12-03 (×3): 100 ug via INTRAVENOUS

## 2018-12-03 MED ORDER — ROCURONIUM BROMIDE 100 MG/10ML IV SOLN
INTRAVENOUS | Status: AC
  Filled 2018-12-03: qty 1

## 2018-12-03 MED ORDER — SODIUM CHLORIDE 0.9 % IV BOLUS
500.0000 mL | Freq: Once | INTRAVENOUS | Status: AC
  Administered 2018-12-03: 500 mL via INTRAVENOUS

## 2018-12-03 MED ORDER — MIDAZOLAM HCL 2 MG/2ML IJ SOLN
INTRAMUSCULAR | Status: DC | PRN
  Administered 2018-12-03: 1 mg via INTRAVENOUS

## 2018-12-03 MED ORDER — MIDAZOLAM HCL 2 MG/2ML IJ SOLN
INTRAMUSCULAR | Status: AC
  Filled 2018-12-03: qty 2

## 2018-12-03 MED ORDER — ONDANSETRON HCL 4 MG/2ML IJ SOLN: 4.0000 mg | Freq: Once | INTRAMUSCULAR | Status: DC | PRN

## 2018-12-03 MED ORDER — POTASSIUM CHLORIDE CRYS ER 20 MEQ PO TBCR
40.0000 meq | EXTENDED_RELEASE_TABLET | ORAL | Status: AC
  Administered 2018-12-03 (×2): 40 meq via ORAL
  Filled 2018-12-03 (×2): qty 2

## 2018-12-03 MED ORDER — ALBUTEROL SULFATE (2.5 MG/3ML) 0.083% IN NEBU
INHALATION_SOLUTION | RESPIRATORY_TRACT | Status: AC
  Administered 2018-12-03: 2.5 mg
  Filled 2018-12-03: qty 3

## 2018-12-03 MED ORDER — PROPOFOL 10 MG/ML IV BOLUS
INTRAVENOUS | Status: DC | PRN
  Administered 2018-12-03: 150 mg via INTRAVENOUS

## 2018-12-03 MED ORDER — ROCURONIUM BROMIDE 100 MG/10ML IV SOLN
INTRAVENOUS | Status: DC | PRN
  Administered 2018-12-03: 25 mg via INTRAVENOUS
  Administered 2018-12-03: 10 mg via INTRAVENOUS
  Administered 2018-12-03: 5 mg via INTRAVENOUS

## 2018-12-03 MED ORDER — SUCCINYLCHOLINE CHLORIDE 200 MG/10ML IV SOSY
PREFILLED_SYRINGE | INTRAVENOUS | Status: DC | PRN
  Administered 2018-12-03: 120 mg via INTRAVENOUS

## 2018-12-03 MED ORDER — PIPERACILLIN-TAZOBACTAM 3.375 G IVPB
3.3750 g | Freq: Three times a day (TID) | INTRAVENOUS | Status: DC
  Administered 2018-12-03: 3.375 g via INTRAVENOUS
  Filled 2018-12-03: qty 50

## 2018-12-03 SURGICAL SUPPLY — 19 items
BAG DRAIN CYSTO-URO LG1000N (MISCELLANEOUS) ×2 IMPLANT
BRUSH SCRUB EZ  4% CHG (MISCELLANEOUS)
BRUSH SCRUB EZ 4% CHG (MISCELLANEOUS) IMPLANT
CATH FOLEY 2WAY  5CC 16FR (CATHETERS) ×1
CATH URETL 5X70 OPEN END (CATHETERS) ×2 IMPLANT
CATH URTH 16FR FL 2W BLN LF (CATHETERS) ×1 IMPLANT
GLOVE BIO SURGEON STRL SZ8 (GLOVE) ×2 IMPLANT
GOWN STRL REUS W/ TWL XL LVL3 (GOWN DISPOSABLE) ×1 IMPLANT
GOWN STRL REUS W/TWL XL LVL3 (GOWN DISPOSABLE) ×1
GUIDEWIRE STR DUAL SENSOR (WIRE) ×2 IMPLANT
KIT TURNOVER CYSTO (KITS) ×2 IMPLANT
PACK CYSTO AR (MISCELLANEOUS) ×2 IMPLANT
SET CYSTO W/LG BORE CLAMP LF (SET/KITS/TRAYS/PACK) ×2 IMPLANT
SOL .9 NS 3000ML IRR  AL (IV SOLUTION) ×1
SOL .9 NS 3000ML IRR UROMATIC (IV SOLUTION) ×1 IMPLANT
STENT URET 6FRX24 CONTOUR (STENTS) ×2 IMPLANT
STENT URET 6FRX26 CONTOUR (STENTS) IMPLANT
SURGILUBE 2OZ TUBE FLIPTOP (MISCELLANEOUS) ×2 IMPLANT
WATER STERILE IRR 1000ML POUR (IV SOLUTION) ×2 IMPLANT

## 2018-12-03 NOTE — Progress Notes (Signed)
Patient in OR at time on required repeat lactic aid. Will have to draw on arrival to room.

## 2018-12-03 NOTE — Progress Notes (Signed)
Urology Consult Follow Up  Subjective: Feeling much better this morning; no significant temp spike post stent placement.  Pain resolved.  Blood cultures growing E. coli and urine from renal pelvis growing gram-negative rods.  Anti-infectives: Anti-infectives (From admission, onward)   Start     Dose/Rate Route Frequency Ordered Stop   12/03/18 1200  meropenem (MERREM) 1 g in sodium chloride 0.9 % 100 mL IVPB     1 g 200 mL/hr over 30 Minutes Intravenous Every 8 hours 12/03/18 1018     12/03/18 0800  piperacillin-tazobactam (ZOSYN) IVPB 3.375 g  Status:  Discontinued     3.375 g 12.5 mL/hr over 240 Minutes Intravenous Every 8 hours 12/03/18 0254 12/03/18 1018   12/03/18 0000  piperacillin-tazobactam (ZOSYN) IVPB 3.375 g  Status:  Discontinued     3.375 g 100 mL/hr over 30 Minutes Intravenous Every 6 hours 12/02/18 2354 12/03/18 0254   12/02/18 2300  piperacillin-tazobactam (ZOSYN) IVPB 3.375 g     3.375 g 100 mL/hr over 30 Minutes Intravenous  Once 12/02/18 2249 12/03/18 0018      Current Facility-Administered Medications  Medication Dose Route Frequency Provider Last Rate Last Dose  . 0.9 %  sodium chloride infusion   Intravenous Continuous Mansy, Jan A, MD 125 mL/hr at 12/03/18 1410    . acetaminophen (TYLENOL) tablet 650 mg  650 mg Oral Q6H PRN Mansy, Jan A, MD   650 mg at 12/03/18 1548   Or  . acetaminophen (TYLENOL) suppository 650 mg  650 mg Rectal Q6H PRN Mansy, Jan A, MD      . enoxaparin (LOVENOX) injection 40 mg  40 mg Subcutaneous Q24H Mansy, Jan A, MD   40 mg at 12/03/18 1144  . ketorolac (TORADOL) 15 MG/ML injection 15 mg  15 mg Intravenous Q6H PRN Mansy, Jan A, MD      . magnesium hydroxide (MILK OF MAGNESIA) suspension 30 mL  30 mL Oral Daily PRN Mansy, Jan A, MD      . meropenem (MERREM) 1 g in sodium chloride 0.9 % 100 mL IVPB  1 g Intravenous Q8H Pricilla Rifflellington, Abby K, RPH   Stopped at 12/03/18 1219  . ondansetron (ZOFRAN) tablet 4 mg  4 mg Oral Q6H PRN Mansy, Jan A, MD        Or  . ondansetron [facility](ZOFRAN) injection 4 mg  4 mg Intravenous Q6H PRN Mansy, Jan A, MD      . sodium chloride flush (NS) 0.9 % injection 3 mL  3 mL Intravenous Once Concha SeFunke, Mary E, MD      . Melene Muller[START ON 12/05/2018] Vitamin D (Ergocalciferol) (DRISDOL) capsule 50,000 Units  50,000 Units Oral Weekly Mansy, Jan A, MD         Objective: Vital signs in last 24 hours: Temp:  [97.8 F (36.6 C)-103.2 F (39.6 C)] 98.6 F (37 C) (07/19 1227) Pulse Rate:  [88-126] 88 (07/19 1227) Resp:  [16-29] 18 (07/19 0412) BP: (80-137)/(48-80) 93/78 (07/19 1227) SpO2:  [92 %-98 %] 96 % (07/19 1227) Weight:  [99.8 kg] 99.8 kg (07/18 1759)  Intake/Output from previous day: 07/18 0701 - 07/19 0700 In: 1663.4 [I.V.:600; IV Piggyback:1063.4] Out: 750 [Urine:750] Intake/Output this shift: Total I/O In: 1737.4 [I.V.:1122.4; IV Piggyback:615] Out: 725 [Urine:725]   Physical Exam: Abdomen soft, urine clear  Lab Results:  Recent Labs    12/02/18 1751 12/03/18 0245  WBC 14.7* 11.0*  HGB 12.8 11.4*  HCT 41.2 36.2  PLT 219 140*   BMET Recent Labs  12/02/18 1751 12/03/18 0245  NA 138 140  K 3.7 3.0*  CL 105 111  CO2 20* 20*  GLUCOSE 151* 152*  BUN 22 22  CREATININE 0.78 1.14*  CALCIUM 9.1 7.8*     Assessment: 63 year old female with an obstructing 6 mm left distal ureteral calculus with sepsis status post stent placement.  Significant clinical improvement.  s/p Procedure(s): CYSTOSCOPY WITH STENT PLACEMENT  Plan: -Continue Zosyn pending ID/sensitivity -DC Foley in a.m. -KUB ordered for definitive stone treatment planning -We will need follow-up office visit 2 weeks to discuss/schedule definitive stone treatment   LOS: 1 day    Abbie Sons, MD 12/03/2018

## 2018-12-03 NOTE — Progress Notes (Signed)
Pharmacy Antibiotic Note  Mary Chen is a 63 y.o. female admitted on 12/02/2018 with abdominal pain. Patient s/p cytoscopy and left ureteral stent placement. Blood cultures now with E. coli in 4/4 bottles. Pharmacy has been consulted for meropenem dosing.  Plan: Per discussion with MD, will discontinue Zosyn and switch to meropenem with plan to de-escalate as soon as sensitivities are available.  Meropenem 1 g IV q8h  Height: 5\' 6"  (167.6 cm) Weight: 220 lb (99.8 kg) IBW/kg (Calculated) : 59.3  Temp (24hrs), Avg:99.6 F (37.6 C), Min:97.8 F (36.6 C), Max:103.2 F (39.6 C)  Recent Labs  Lab 12/02/18 1751 12/02/18 2343 12/03/18 0245 12/03/18 0651  WBC 14.7*  --  11.0*  --   CREATININE 0.78  --  1.14*  --   LATICACIDVEN  --  2.4* 2.3* 1.7    Estimated Creatinine Clearance: 60.2 mL/min (A) (by C-G formula based on SCr of 1.14 mg/dL (H)).    No Known Allergies  Antimicrobials this admission: Zosyn 7/18 >> 7/19 Meropenem 7/19 >>   Dose adjustments this admission: NA  Microbiology results: 7/18 BCx: 4/4 E. coli  7/19 UCx: pending  7/18 COVID 19 negative  Thank you for allowing pharmacy to be a part of this patient's care.  Tawnya Crook, PharmD Clinical Pharmacist 12/03/2018 10:20 AM

## 2018-12-03 NOTE — Anesthesia Preprocedure Evaluation (Addendum)
Anesthesia Evaluation  Patient identified by MRN, date of birth, ID band Patient awake    Reviewed: Allergy & Precautions, H&P , NPO status , Patient's Chart, lab work & pertinent test results, reviewed documented beta blocker date and time   Airway Mallampati: II  TM Distance: >3 FB Neck ROM: full    Dental  (+) Teeth Intact   Pulmonary neg pulmonary ROS,    Pulmonary exam normal        Cardiovascular Exercise Tolerance: Poor negative cardio ROS Normal cardiovascular exam Rhythm:regular Rate:Normal     Neuro/Psych negative neurological ROS  negative psych ROS   GI/Hepatic negative GI ROS, Neg liver ROS,   Endo/Other  negative endocrine ROSdiabetes, Well Controlled  Renal/GU negative Renal ROS  negative genitourinary   Musculoskeletal   Abdominal   Peds  Hematology negative hematology ROS (+)   Anesthesia Other Findings Past Medical History: No date: Fibrocystic disease of breast Nov 2009: Hx pulmonary embolism     Comment:  postoperative, negaive Factor V Leuiden, neg LE               ultrasound.  No date: Ovarian cyst Past Surgical History: No date: ABDOMINAL HYSTERECTOMY No date: BREAST BIOPSY; Left     Comment:  neg No date: BREAST EXCISIONAL BIOPSY; Left     Comment:  neg 2009: TOTAL ABDOMINAL HYSTERECTOMY W/ BILATERAL SALPINGOOPHORECTOMY     Comment:  for bilateral ovarian cysts and fibroid uterus BMI    Body Mass Index: 35.51 kg/m     Reproductive/Obstetrics negative OB ROS                            Anesthesia Physical Anesthesia Plan  ASA: III and emergent  Anesthesia Plan: General ETT   Post-op Pain Management:    Induction:   PONV Risk Score and Plan:   Airway Management Planned:   Additional Equipment:   Intra-op Plan:   Post-operative Plan:   Informed Consent: I have reviewed the patients History and Physical, chart, labs and discussed the procedure  including the risks, benefits and alternatives for the proposed anesthesia with the patient or authorized representative who has indicated his/her understanding and acceptance.     Dental Advisory Given  Plan Discussed with: CRNA  Anesthesia Plan Comments:        Anesthesia Quick Evaluation

## 2018-12-03 NOTE — Progress Notes (Signed)
PHARMACY CONSULT NOTE - FOLLOW UP  Pharmacy Consult for Electrolyte Monitoring and Replacement   Recent Labs: Potassium (mmol/L)  Date Value  12/03/2018 3.0 (L)  06/21/2012 4.0   Calcium (mg/dL)  Date Value  12/03/2018 7.8 (L)   Calcium, Total (mg/dL)  Date Value  06/21/2012 8.7   Albumin (g/dL)  Date Value  12/02/2018 4.0  06/21/2012 3.7   Sodium (mmol/L)  Date Value  12/03/2018 140  06/21/2012 142     Assessment: 63 year old female admitted with abdominal pain and found to have 6 mm left distal ureteral calculus. Patient now s/p cystoscopy and left ureteral stent placement. Potassium has decreased from 3.7 to 3.  Goal of Therapy:  Electrolytes WNL  Plan:  Will give potassium 40 mEq PO x 2.  Will order potassium and magnesium with morning labs.  Pharmacy to continue to follow and replace electrolytes as indicated.  Tawnya Crook ,PharmD Clinical Pharmacist 12/03/2018 10:38 AM

## 2018-12-03 NOTE — Progress Notes (Signed)
[facility] - [place] at [facility]   PATIENT NAME: Mary Chen    MR#:  [id number]  DATE OF BIRTH:  1955/05/28  SUBJECTIVE:   Patient feels little week. She overall feels a lot better than yesterday. Blood pressure was bit on the lower side earlier received normal saline bolus. Systolic blood pressure in the 90s. No fever this morning. REVIEW OF SYSTEMS:   Review of Systems  Constitutional: Positive for malaise/fatigue. Negative for chills, fever and weight loss.  HENT: Negative for ear discharge, ear pain and nosebleeds.   Eyes: Negative for blurred vision, pain and discharge.  Respiratory: Negative for sputum production, shortness of breath, wheezing and stridor.   Cardiovascular: Negative for chest pain, palpitations, orthopnea and PND.  Gastrointestinal: Negative for abdominal pain, diarrhea, nausea and vomiting.  Genitourinary: Negative for frequency and urgency.  Musculoskeletal: Negative for back pain and joint pain.  Neurological: Positive for weakness. Negative for sensory change, speech change and focal weakness.  Psychiatric/Behavioral: Negative for depression and hallucinations. The patient is not nervous/anxious.    Tolerating Diet:yes Tolerating PT: not needed  DRUG ALLERGIES:  No Known Allergies  VITALS:  Blood pressure 93/71, pulse 97, temperature 97.9 F (36.6 C), temperature source Oral, resp. rate 18, height 5\' 6"  (1.676 m), weight 99.8 kg, SpO2 96 %.  PHYSICAL EXAMINATION:   Physical Exam  GENERAL:  63 y.o.-year-old patient lying in the bed with no acute distress.  EYES: Pupils equal, round, reactive to light and accommodation. No scleral icterus. Extraocular muscles intact.  HEENT: Head atraumatic, normocephalic. Oropharynx and nasopharynx clear.  NECK:  Supple, no jugular venous distention. No thyroid enlargement, no tenderness.  LUNGS: Normal breath sounds bilaterally, no wheezing, rales, rhonchi. No use of accessory  muscles of respiration.  CARDIOVASCULAR: S1, S2 normal. No murmurs, rubs, or gallops.  ABDOMEN: Soft, nontender, nondistended. Bowel sounds present. No organomegaly or mass.  EXTREMITIES: No cyanosis, clubbing or edema b/l.    NEUROLOGIC: Cranial nerves II through XII are intact. No focal Motor or sensory deficits b/l.   PSYCHIATRIC:  patient is alert and oriented x 3.  SKIN: No obvious rash, lesion, or ulcer.   LABORATORY PANEL:  CBC Recent Labs  Lab 12/03/18 0245  WBC 11.0*  HGB 11.4*  HCT 36.2  PLT 140*    Chemistries  Recent Labs  Lab 12/02/18 1751 12/03/18 0245  NA 138 140  K 3.7 3.0*  CL 105 111  CO2 20* 20*  GLUCOSE 151* 152*  BUN 22 22  CREATININE 0.78 1.14*  CALCIUM 9.1 7.8*  AST 18  --   ALT 28  --   ALKPHOS 70  --   BILITOT 2.3*  --    Cardiac Enzymes No results for input(s): TROPONINI in the last 168 hours. RADIOLOGY:  Dg Abd 1 View  Result Date: 12/03/2018 CLINICAL DATA:  Intraoperative localization for ureteral stent placement. EXAM: DG C-ARM 61-120 MIN; ABDOMEN - 1 VIEW COMPARISON:  Prior CT from 12/02/2018 FINDINGS: Few spot intraoperative fluoroscopic views for interval placement of a double-J left ureteral stent. Provided images demonstrate the partially visualized proximal and distal coiled aspects of the stent. Images are used for intraoperative localization. Total fluoroscopy time equals 42 seconds. IMPRESSION: Intraoperative localization for ureteral stent placement. Electronically Signed   By: Rise MuBenjamin  McClintock M.D.   On: 12/03/2018 01:35   Ct Abdomen Pelvis W Contrast  Result Date: 12/02/2018 CLINICAL DATA:  Left lower quadrant abdominal pain. EXAM: CT ABDOMEN AND PELVIS WITH CONTRAST  TECHNIQUE: Multidetector CT imaging of the abdomen and pelvis was performed using the standard protocol following bolus administration of intravenous contrast. CONTRAST:  100mL OMNIPAQUE IOHEXOL 300 MG/ML  SOLN COMPARISON:  None. FINDINGS: Lower chest: There is a  trace right-sided pleural effusion.The heart size is normal. Hepatobiliary: There are multiple presumed hepatic cysts throughout the left and right hepatic lobes. Many of these are too small to characterize. Cholelithiasis without acute inflammation.There is no biliary ductal dilation. Pancreas: Normal contours without ductal dilatation. No peripancreatic fluid collection. Spleen: No splenic laceration or hematoma. Adrenals/Urinary Tract: --Adrenal glands: No adrenal hemorrhage. --Right kidney/ureter: There are multiple nonobstructing stones throughout the right kidney measuring up to approximately 5 mm. There is no right-sided hydronephrosis. --Left kidney/ureter: There is mild to moderate left-sided hydroureteronephrosis secondary to an obstructing 6 mm stone at the left UVJ. There are multiple additional nonobstructing stones throughout the left kidney. --Urinary bladder: Unremarkable. Stomach/Bowel: --Stomach/Duodenum: There is a small hiatal hernia. There is a posterior gastric diverticulum. --Small bowel: No dilatation or inflammation. --Colon: No focal abnormality. --Appendix: Normal. Vascular/Lymphatic: Normal course and caliber of the major abdominal vessels. --No retroperitoneal lymphadenopathy. --No mesenteric lymphadenopathy. --No pelvic or inguinal lymphadenopathy. Reproductive: Unremarkable Other: No ascites or free air. The abdominal wall is normal. Musculoskeletal. No acute displaced fractures. IMPRESSION: 1. Mild to moderate left-sided hydroureteronephrosis secondary to an obstructing 6 mm stone in the distal left ureter at the left UVJ. 2. Bilateral nephrolithiasis. 3. Probable cholelithiasis without CT evidence for acute cholecystitis. 4. Trace right-sided pleural effusion. Electronically Signed   By: Katherine Mantlehristopher  Green M.D.   On: 12/02/2018 22:19   Dg C-arm 1-60 Min  Result Date: 12/03/2018 CLINICAL DATA:  Intraoperative localization for ureteral stent placement. EXAM: DG C-ARM 61-120 MIN;  ABDOMEN - 1 VIEW COMPARISON:  Prior CT from 12/02/2018 FINDINGS: Few spot intraoperative fluoroscopic views for interval placement of a double-J left ureteral stent. Provided images demonstrate the partially visualized proximal and distal coiled aspects of the stent. Images are used for intraoperative localization. Total fluoroscopy time equals 42 seconds. IMPRESSION: Intraoperative localization for ureteral stent placement. Electronically Signed   By: Rise MuBenjamin  McClintock M.D.   On: 12/03/2018 01:35   ASSESSMENT AND PLAN:  Mary Chen  is a 63 y.o. female with a known history of vitamin D deficiency, who presented to the emergency room acute onset of left lower abdominal and flank pain that started earlier today with associated fever and chills as well as nausea and vomiting.     1. E coli Sepsis with  left ureteral stone with subsequent obstructive uropathy. - continue on hydration with IV normal saline  -IV meropenem for now and change once culture sensitivity is available - PRN antiemetics and pain management.  - Urology consultation was obtained by Dr. Lonna CobbStoioff and pt is s/p cysto- ureteroscopy with Left stent placement tonight.  - Will follow urine culture.  2.  Sepsis secondary to #1.  Management as above. -  blood cultures 4/4 E coli -UC pending  3.  Vitamin D deficiency.   -continue her Drisdol.  4.  DVT prophylaxis.  Subcutaneous Lovenox.  5. Hypotension due to his sepsis -blood pressure improved after bolus of normal saline  Management plans discussed with the patient  CODE STATUS: FULL  DVT Prophylaxis: lovenox  TOTAL TIME TAKING CARE OF THIS PATIENT: *30* minutes.  >50% time spent on counselling and coordination of care  POSSIBLE D/C IN *2-3 DAYS, DEPENDING ON CLINICAL CONDITION.  Note: This dictation was prepared with  Dragon dictation along with smaller Company secretary. Any transcriptional errors that result from this process are unintentional.  Fritzi Mandes  M.D on 12/03/2018 at 10:25 AM  Between 7am to 6pm - Pager - [phone]  After 6pm go to [email or web address] - password [email or web address] [facility]  Office  [phone]  CC: Primary care physician; Crecencio Mc, MDPatient ID: Ardath Sax, female   DOB: [birth year], 63 y.o.   MRN: [id number]

## 2018-12-03 NOTE — Op Note (Signed)
Preoperative diagnosis:  1. Left distal ureteral calculus structure 2. Sepsis from urinary source  Postoperative diagnosis:  1. Same  Procedure:  1. Cystoscopy 2. Left ureteral stent placement (6FR) 24 cm  Surgeon: Nicki Reaper C. Towanda Hornstein, M.D.  Anesthesia: General  Complications: None  Intraoperative findings: Fluoroscopy remarkable for persistent left hydronephrosis/hydroureter from prior CT with contrast with: No contrast to a 6 mm left distal ureteral calculus  EBL: None  Specimens: Urine from left renal pelvis for culture  Indication: Mary Chen is a 63 y.o. female presenting to the ED this evening with onset of left lower quadrant abdominal pain and left flank pain this morning associated with nausea and vomiting and fever.  Temperature spike in the ED to 103.2.Marland Kitchen After reviewing the management options for treatment, he elected to proceed with the above surgical procedure(s). We have discussed the potential benefits and risks of the procedure, side effects of the proposed treatment, the likelihood of the patient achieving the goals of the procedure, and any potential problems that might occur during the procedure or recuperation. Informed consent has been obtained.  Description of procedure:  The patient was taken to the operating room and general anesthesia was induced.  The patient was placed in the dorsal lithotomy position, prepped and draped in the usual sterile fashion, and preoperative antibiotics were administered. A preoperative time-out was performed.   A 21 French cystoscope was lubricated and passed per urethra.  Panendoscopy was performed and the bladder mucosa showed scattered cystitis cystica of the trigone.  The right ureteral orifice was normal in appearance with clear efflux.  No efflux was then a 5 from the left ureteral orifice after a brief period of observation.  A 0.038 sensor guidewire was advanced into the right ureteral orifice with fluoroscopy findings  as described above.  The wire was advanced proximally to the renal pelvis under fluoroscopic guidance.  A 5 French open-ended ureteral catheter was advanced over the wire.  The wire was removed and 10 cc of slightly cloudy urine was aspirated and sent for culture.  The guidewire was replaced and the ureteral catheter was removed.  A 6 French/24 cm Contour ureteral stent was placed over the guidewire without difficulty.  There was a good curl seen in the renal pelvis under fluoroscopy and the distal end of the stent was well-positioned under direct vision.   The bladder was then emptied and the procedure ended.  The patient appeared to tolerate the procedure well and without complications.  After anesthetic reversal she was transported to the PACU in stable condition.    [name], MD

## 2018-12-03 NOTE — Anesthesia Postprocedure Evaluation (Signed)
Anesthesia Post Note  Patient: Mary Chen  Procedure(s) Performed: CYSTOSCOPY WITH STENT PLACEMENT (Left )  Patient location during evaluation: PACU Anesthesia Type: General Level of consciousness: awake and alert Pain management: pain level controlled Vital Signs Assessment: post-procedure vital signs reviewed and stable Respiratory status: spontaneous breathing, nonlabored ventilation, respiratory function stable and patient connected to nasal cannula oxygen Cardiovascular status: blood pressure returned to baseline and stable Postop Assessment: no apparent nausea or vomiting Anesthetic complications: no     Last Vitals:  Vitals:   12/03/18 1227 12/03/18 1649  BP: 93/78 92/72  Pulse: 88 92  Resp:  16  Temp: 37 C 37.2 C  SpO2: 96% 92%    Last Pain:  Vitals:   12/03/18 1937  TempSrc:   PainSc: 0-No pain                 Molli Barrows

## 2018-12-03 NOTE — H&P (Addendum)
[facility] [place] at [facility]   PATIENT NAME: Mary Chen    MR#:  [id number]  DATE OF BIRTH:  [birth year]  DATE OF ADMISSION:  12/02/2018  PRIMARY CARE PHYSICIAN: Sherlene Shamsullo, Teresa L, MD   REQUESTING/REFERRING PHYSICIAN: Artis DelayFunke, Mary, MD CHIEF COMPLAINT:   Chief Complaint  Patient presents with  . Abdominal Pain    HISTORY OF PRESENT ILLNESS:  Mary Chen  is a 63 y.o. female with a known history of vitamin D deficiency, who presented to the emergency room acute onset of left lower abdominal and flank pain that started earlier today with associated fever and chills as well as nausea and vomiting.  She denies any dysuria, oliguria or hematuria or flank pain.  She denies any headache or dizziness or blurred vision.  No cough or wheezing or shortness of breath.  No rhinorrhea or nasal congestion or sore throat.  No recent sick exposures or exposure to COVID-19.  On presentation to the emergency room, her temperature was 100.1 and later 102.1 with a pulse of 106 and otherwise normal vital signs.  Labs revealed normal BUN and creatinine and leukocytosis of 14.7 with microcytosis.  Her COVID-19 test came back negative.  Abdominal pelvic CT scan showed: 1. Mild to moderate left-sided hydroureteronephrosis secondary to an obstructing 6 mm stone in the distal left ureter at the left UVJ. 2. Bilateral nephrolithiasis. 3. Probable cholelithiasis without CT evidence for acute cholecystitis. 4. Trace right-sided pleural effusion.  The patient was given 650 mg of p.o. Tylenol as well as 3.375 g of IV Zosyn, 50 mcg of IV fentanyl, 0.5 mg of IV Dilaudid, 4 mg of IV Zofran and 2 L bolus of IV normal saline.  Urology consultation was obtained by Dr. Lonna CobbStoioff the patient will be taken to the OR from the ER.  She will be admitted to a medical monitored bed for further evaluation and management.   PAST MEDICAL HISTORY:   Past Medical History:  Diagnosis Date  .  Fibrocystic disease of breast   . Hx pulmonary embolism Nov 2009   postoperative, negaive Factor V Leuiden, neg LE ultrasound.   . Ovarian cyst   Vitamin D deficiency  PAST SURGICAL HISTORY:   Past Surgical History:  Procedure Laterality Date  . ABDOMINAL HYSTERECTOMY    . BREAST BIOPSY Left    neg  . BREAST EXCISIONAL BIOPSY Left    neg  . TOTAL ABDOMINAL HYSTERECTOMY W/ BILATERAL SALPINGOOPHORECTOMY  2009   for bilateral ovarian cysts and fibroid uterus    SOCIAL HISTORY:   Social History   Tobacco Use  . Smoking status: Never Smoker  . Smokeless tobacco: Never Used  Substance Use Topics  . Alcohol use: No    FAMILY HISTORY:   Family History  Problem Relation Age of Onset  . Cancer Mother        breast  . Breast cancer Mother 562  . Bipolar disorder Brother        self inficted GSW   . Cancer Father [id number]       lung Ca mets to liver     DRUG ALLERGIES:  No Known Allergies  REVIEW OF SYSTEMS:   ROS As per history of present illness. All pertinent systems were reviewed above. Constitutional,  HEENT, cardiovascular, respiratory, GI, GU, musculoskeletal, neuro, psychiatric, endocrine,  integumentary and hematologic systems were reviewed and are otherwise  negative/unremarkable except for positive findings mentioned above in the HPI.   MEDICATIONS AT HOME:  Prior to Admission medications   Medication Sig Start Date End Date Taking? Authorizing Provider  ergocalciferol (DRISDOL) 1.25 MG (50000 UT) capsule Take 1 capsule (50,000 Units total) by mouth once a week. 04/04/18   Sherlene Shamsullo, Teresa L, MD      VITAL SIGNS:  Blood pressure 137/80, pulse (!) 124, temperature (!) 103.2 F (39.6 C), temperature source Oral, resp. rate 18, height 5\' 6"  (1.676 m), weight 99.8 kg, SpO2 95 %.  PHYSICAL EXAMINATION:  Physical Exam  GENERAL:  63 y.o.-year-old female patient lying in the bed, in chills with no acute respiratory distress.  EYES: Pupils equal, round, reactive to  light and accommodation. No scleral icterus. Extraocular muscles intact.  HEENT: Head atraumatic, normocephalic. Oropharynx and nasopharynx clear.  NECK:  Supple, no jugular venous distention. No thyroid enlargement, no tenderness.  LUNGS: Normal breath sounds bilaterally, no wheezing, rales,rhonchi or crepitation. No use of accessory muscles of respiration.  CARDIOVASCULAR: Regular rate and rhythm, S1, S2 normal. No murmurs, rubs, or gallops.  ABDOMEN: Soft, with mild suprapubic tenderness as well as left CVA tenderness.  She had no rebound tenderness guarding or rigidity.  Bowel sounds present. No organomegaly or mass.  EXTREMITIES: No pedal edema, cyanosis, or clubbing.  NEUROLOGIC: Cranial nerves II through XII are intact. Muscle strength 5/5 in all extremities. Sensation intact. Gait not checked.  PSYCHIATRIC: The patient is alert and oriented x 3.  Normal affect and good eye contact. SKIN: No obvious rash, lesion, or ulcer.   LABORATORY PANEL:   CBC Recent Labs  Lab 12/02/18 1751  WBC 14.7*  HGB 12.8  HCT 41.2  PLT 219   ------------------------------------------------------------------------------------------------------------------  Chemistries  Recent Labs  Lab 12/02/18 1751  NA 138  K 3.7  CL 105  CO2 20*  GLUCOSE 151*  BUN 22  CREATININE 0.78  CALCIUM 9.1  AST 18  ALT 28  ALKPHOS 70  BILITOT 2.3*   ------------------------------------------------------------------------------------------------------------------  Cardiac Enzymes No results for input(s): TROPONINI in the last 168 hours. ------------------------------------------------------------------------------------------------------------------  RADIOLOGY:  Ct Abdomen Pelvis W Contrast  Result Date: 12/02/2018 CLINICAL DATA:  Left lower quadrant abdominal pain. EXAM: CT ABDOMEN AND PELVIS WITH CONTRAST TECHNIQUE: Multidetector CT imaging of the abdomen and pelvis was performed using the standard  protocol following bolus administration of intravenous contrast. CONTRAST:  100mL OMNIPAQUE IOHEXOL 300 MG/ML  SOLN COMPARISON:  None. FINDINGS: Lower chest: There is a trace right-sided pleural effusion.The heart size is normal. Hepatobiliary: There are multiple presumed hepatic cysts throughout the left and right hepatic lobes. Many of these are too small to characterize. Cholelithiasis without acute inflammation.There is no biliary ductal dilation. Pancreas: Normal contours without ductal dilatation. No peripancreatic fluid collection. Spleen: No splenic laceration or hematoma. Adrenals/Urinary Tract: --Adrenal glands: No adrenal hemorrhage. --Right kidney/ureter: There are multiple nonobstructing stones throughout the right kidney measuring up to approximately 5 mm. There is no right-sided hydronephrosis. --Left kidney/ureter: There is mild to moderate left-sided hydroureteronephrosis secondary to an obstructing 6 mm stone at the left UVJ. There are multiple additional nonobstructing stones throughout the left kidney. --Urinary bladder: Unremarkable. Stomach/Bowel: --Stomach/Duodenum: There is a small hiatal hernia. There is a posterior gastric diverticulum. --Small bowel: No dilatation or inflammation. --Colon: No focal abnormality. --Appendix: Normal. Vascular/Lymphatic: Normal course and caliber of the major abdominal vessels. --No retroperitoneal lymphadenopathy. --No mesenteric lymphadenopathy. --No pelvic or inguinal lymphadenopathy. Reproductive: Unremarkable Other: No ascites or free air. The abdominal wall is normal. Musculoskeletal. No acute displaced fractures. IMPRESSION: 1. Mild to moderate left-sided  hydroureteronephrosis secondary to an obstructing 6 mm stone in the distal left ureter at the left UVJ. 2. Bilateral nephrolithiasis. 3. Probable cholelithiasis without CT evidence for acute cholecystitis. 4. Trace right-sided pleural effusion. Electronically Signed   By: Constance Holster M.D.   On:  12/02/2018 22:19      IMPRESSION AND PLAN:   1.  Likely  infected left ureteral stone with subsequent obstructive uropathy.  The patient will be admitted to a medical monitored bed.  She will be continued on hydration with IV normal saline as well as antibiotic therapy with IV Zosyn, PRN antiemetics and pain management.  Urology consultation was obtained by Dr. Bernardo Heater and the patient will likely undergo cystourethroscopy and likely stent placement tonight.  Will follow urine culture.  2.  Sepsis secondary to #1.  Management as above.  Will follow urine and blood cultures.  3.  Vitamin D deficiency.  We will continue her Drisdol.  4.  DVT prophylaxis.  Subcutaneous Lovenox.   All the records are reviewed and case discussed with ED provider. The plan of care was discussed in details with the patient (and family). I answered all questions. The patient agreed to proceed with the above mentioned plan. Further management will depend upon hospital course.   CODE STATUS: Full code  TOTAL TIME TAKING CARE OF THIS PATIENT: 45 minutes.    Christel Mormon M.D on 12/03/2018 at 12:03 AM  Pager - [phone]  After 6pm go to [email or web address] - Proofreader  [facility]  Office  [phone]  CC: Primary care physician; Crecencio Mc, MD   Note: This dictation was prepared with Dragon dictation along with smaller phrase technology. Any transcriptional errors that result from this process are unintentional.

## 2018-12-03 NOTE — Transfer of Care (Signed)
Immediate Anesthesia Transfer of Care Note  Patient: Mary Chen  Procedure(s) Performed: CYSTOSCOPY WITH STENT PLACEMENT (Left )  Patient Location: PACU  Anesthesia Type:General  Level of Consciousness: awake and oriented  Airway & Oxygen Therapy: Patient Spontanous Breathing and Patient connected to face mask oxygen  Post-op Assessment: Report given to RN and Post -op Vital signs reviewed and stable  Post vital signs: Reviewed and stable  Last Vitals:  Vitals Value Taken Time  BP 108/72 12/03/18 0133  Temp 36.6 C 12/03/18 0133  Pulse 106 12/03/18 0138  Resp 27 12/03/18 0138  SpO2 96 % 12/03/18 0138  Vitals shown include unvalidated device data.  Last Pain:  Vitals:   12/03/18 0133  TempSrc:   PainSc: 0-No pain         Complications: No apparent anesthesia complications

## 2018-12-03 NOTE — Progress Notes (Signed)
Per MD recheck lactic acid in three hours

## 2018-12-03 NOTE — Anesthesia Procedure Notes (Signed)
Procedure Name: Intubation Date/Time: 12/03/2018 1:00 AM Performed by: Allean Found, CRNA Pre-anesthesia Checklist: Patient identified, Patient being monitored, Timeout performed, Emergency Drugs available and Suction available Patient Re-evaluated:Patient Re-evaluated prior to induction Oxygen Delivery Method: Circle system utilized Preoxygenation: Pre-oxygenation with 100% oxygen Induction Type: IV induction Ventilation: Mask ventilation without difficulty Laryngoscope Size: 3 and McGraph Grade View: Grade I Tube type: Oral Tube size: 7.0 mm Number of attempts: 1 Airway Equipment and Method: Stylet Placement Confirmation: ETT inserted through vocal cords under direct vision,  positive ETCO2 and breath sounds checked- equal and bilateral Secured at: 21 cm Tube secured with: Tape Dental Injury: Teeth and Oropharynx as per pre-operative assessment

## 2018-12-03 NOTE — Anesthesia Post-op Follow-up Note (Signed)
Anesthesia QCDR form completed.        

## 2018-12-03 NOTE — Progress Notes (Signed)
Patient alert and oriented X4 and answers all questions appropriately. Patient got a regular diet order from MD. Patient ordered tea with her lunch. Tea consisted of small cup provided by dietary. A few hours after patient had lunch, daughter called complaining that she got tea for lunch. She expressed concern about her having tea and she was admitted with a kidney stone. RN told daughter that RN will call dietary to request the patient to not have tea. Daughter complained further, regardless of the solution RN brought up to her. Daughter threatened to "make phone calls because I have your name now." RN apologized to daughter. Daughter also complained that the doctor did not give her an update. While on the phone, RN messaged MD asking her to update the daughter. Agricultural consultant notified. 2A Unit director consulted via telephone. Phone conversation witnessed by [employer]. After the phone call with the daughter, RN called dietary and told them not to send tea to the patient per family request.

## 2018-12-03 NOTE — Progress Notes (Signed)
MD paged patient lactic acid 2.3

## 2018-12-03 NOTE — Progress Notes (Signed)
PHARMACY - PHYSICIAN COMMUNICATION CRITICAL VALUE ALERT - BLOOD CULTURE IDENTIFICATION (BCID)  Mary Chen is an 63 y.o. female who presented to [facility] on 12/02/2018 with a chief complaint of abdominal pain  Assessment:  4/4 bottles E. coli (likely urinary source)  Name of physician (or Provider) Contacted: Dr. Posey Pronto  Current antibiotics: Zosyn  Changes to prescribed antibiotics recommended: meropenem  Results for orders placed or performed during the hospital encounter of 12/02/18  Blood Culture ID Panel (Reflexed) (Collected: 12/02/2018 11:43 PM)  Result Value Ref Range   Enterococcus species NOT DETECTED NOT DETECTED   Listeria monocytogenes NOT DETECTED NOT DETECTED   Staphylococcus species NOT DETECTED NOT DETECTED   Staphylococcus aureus (BCID) NOT DETECTED NOT DETECTED   Streptococcus species NOT DETECTED NOT DETECTED   Streptococcus agalactiae NOT DETECTED NOT DETECTED   Streptococcus pneumoniae NOT DETECTED NOT DETECTED   Streptococcus pyogenes NOT DETECTED NOT DETECTED   Acinetobacter baumannii NOT DETECTED NOT DETECTED   Enterobacteriaceae species DETECTED (A) NOT DETECTED   Enterobacter cloacae complex NOT DETECTED NOT DETECTED   Escherichia coli DETECTED (A) NOT DETECTED   Klebsiella oxytoca NOT DETECTED NOT DETECTED   Klebsiella pneumoniae NOT DETECTED NOT DETECTED   Proteus species NOT DETECTED NOT DETECTED   Serratia marcescens NOT DETECTED NOT DETECTED   Carbapenem resistance NOT DETECTED NOT DETECTED   Haemophilus influenzae NOT DETECTED NOT DETECTED   Neisseria meningitidis NOT DETECTED NOT DETECTED   Pseudomonas aeruginosa NOT DETECTED NOT DETECTED   Candida albicans NOT DETECTED NOT DETECTED   Candida glabrata NOT DETECTED NOT DETECTED   Candida krusei NOT DETECTED NOT DETECTED   Candida parapsilosis NOT DETECTED NOT DETECTED   Candida tropicalis NOT DETECTED NOT DETECTED    Tawnya Crook, PharmD Clinical Pharmacist 12/03/2018  4:15 PM

## 2018-12-03 NOTE — Progress Notes (Signed)
Spoke with Dr. Andree Elk about patients shallow breathing and O2 sat. Gave 2 duonebs and is okay with patient going to the floor with current status.

## 2018-12-04 ENCOUNTER — Encounter: Payer: Self-pay | Admitting: Urology

## 2018-12-04 ENCOUNTER — Telehealth: Payer: Self-pay | Admitting: Urology

## 2018-12-04 LAB — BASIC METABOLIC PANEL
Anion gap: 8 (ref 5–15)
BUN: 16 mg/dL (ref 8–23)
CO2: 20 mmol/L — ABNORMAL LOW (ref 22–32)
Calcium: 7.9 mg/dL — ABNORMAL LOW (ref 8.9–10.3)
Chloride: 116 mmol/L — ABNORMAL HIGH (ref 98–111)
Creatinine, Ser: 0.78 mg/dL (ref 0.44–1.00)
GFR calc Af Amer: >60 mL/min (ref >60)
GFR calc non Af Amer: >60 mL/min (ref >60)
Glucose, Bld: 99 mg/dL (ref 70–99)
Potassium: 3.8 mmol/L (ref 3.5–5.1)
Sodium: 144 mmol/L (ref 135–145)

## 2018-12-04 LAB — MAGNESIUM: Magnesium: 2 mg/dL (ref 1.7–2.4)

## 2018-12-04 MED ORDER — ACETAMINOPHEN 325 MG PO TABS: 650.0000 mg | ORAL_TABLET | Freq: Four times a day (QID) | ORAL | Status: DC | PRN

## 2018-12-04 MED ORDER — ACETAMINOPHEN 650 MG RE SUPP: 650.0000 mg | Freq: Four times a day (QID) | RECTAL | Status: DC | PRN

## 2018-12-04 NOTE — Progress Notes (Signed)
PHARMACY CONSULT NOTE - FOLLOW UP  Pharmacy Consult for Electrolyte Monitoring and Replacement   Recent Labs: Potassium (mmol/L)  Date Value  12/04/2018 3.8  06/21/2012 4.0   Magnesium (mg/dL)  Date Value  12/04/2018 2.0   Calcium (mg/dL)  Date Value  12/04/2018 7.9 (L)   Calcium, Total (mg/dL)  Date Value  06/21/2012 8.7   Albumin (g/dL)  Date Value  12/02/2018 4.0  06/21/2012 3.7   Sodium (mmol/L)  Date Value  12/04/2018 144  06/21/2012 142     Assessment: 63 year old female admitted with abdominal pain and found to have 6 mm left distal ureteral calculus. Patient now s/p cystoscopy and left ureteral stent placement. Potassium has decreased from 3.7 to 3.  Goal of Therapy:  Electrolytes WNL  Plan:  K 3.8 Mag 2.0  Scr 0.78 No replacement needed at this time. Will f/u with am labs  Pharmacy to continue to follow and replace electrolytes as indicated.  Noralee Space ,PharmD Clinical Pharmacist 12/04/2018 8:00 AM

## 2018-12-04 NOTE — Telephone Encounter (Signed)
APP SCHEDULED

## 2018-12-04 NOTE — Telephone Encounter (Signed)
-----   Message from Abbie Sons, MD sent at 12/03/2018  7:56 PM EDT ----- Regarding: Hospital follow-up Needs follow-up with me 2 to 3 weeks

## 2018-12-04 NOTE — Progress Notes (Signed)
[place] [facility] [place] NAME: Mary Chen    MR#:  [id number]  DATE OF BIRTH:  09/29/1955  SUBJECTIVE:  patient doing well. No high-grade fever. She is out in the chair. No new complaints. Ate a bowl of fruit this morning. REVIEW OF SYSTEMS:   Review of Systems  Constitutional: Positive for malaise/fatigue. Negative for chills, fever and weight loss.  HENT: Negative for ear discharge, ear pain and nosebleeds.   Eyes: Negative for blurred vision, pain and discharge.  Respiratory: Negative for sputum production, shortness of breath, wheezing and stridor.   Cardiovascular: Negative for chest pain, palpitations, orthopnea and PND.  Gastrointestinal: Negative for abdominal pain, diarrhea, nausea and vomiting.  Genitourinary: Negative for frequency and urgency.  Musculoskeletal: Negative for back pain and joint pain.  Neurological: Positive for weakness. Negative for sensory change, speech change and focal weakness.  Psychiatric/Behavioral: Negative for depression and hallucinations. The patient is not nervous/anxious.    Tolerating Diet:yes Tolerating PT: not needed  DRUG ALLERGIES:  No Known Allergies  VITALS:  Blood pressure 107/63, pulse (!) 101, temperature 99.3 F (37.4 C), temperature source Oral, resp. rate 20, height 5\' 6"  (1.676 m), weight 99.8 kg, SpO2 94 %.  PHYSICAL EXAMINATION:   Physical Exam  GENERAL:  63 y.o.-year-old patient lying in the bed with no acute distress.  EYES: Pupils equal, round, reactive to light and accommodation. No scleral icterus. Extraocular muscles intact.  HEENT: Head atraumatic, normocephalic. Oropharynx and nasopharynx clear.  NECK:  Supple, no jugular venous distention. No thyroid enlargement, no tenderness.  LUNGS: Normal breath sounds bilaterally, no wheezing, rales, rhonchi. No use of accessory muscles of respiration.  CARDIOVASCULAR: S1, S2 normal. No murmurs, rubs, or gallops.   ABDOMEN: Soft, nontender, nondistended. Bowel sounds present. No organomegaly or mass.  EXTREMITIES: No cyanosis, clubbing or edema b/l.    NEUROLOGIC: Cranial nerves II through XII are intact. No focal Motor or sensory deficits b/l.   PSYCHIATRIC:  patient is alert and oriented x 3.  SKIN: No obvious rash, lesion, or ulcer.   LABORATORY PANEL:  CBC Recent Labs  Lab 12/03/18 0245  WBC 11.0*  HGB 11.4*  HCT 36.2  PLT 140*    Chemistries  Recent Labs  Lab 12/02/18 1751  12/04/18 0434  NA 138   < > 144  K 3.7   < > 3.8  CL 105   < > 116*  CO2 20*   < > 20*  GLUCOSE 151*   < > 99  BUN 22   < > 16  CREATININE 0.78   < > 0.78  CALCIUM 9.1   < > 7.9*  MG  --   --  2.0  AST 18  --   --   ALT 28  --   --   ALKPHOS 70  --   --   BILITOT 2.3*  --   --    < > = values in this interval not displayed.   Cardiac Enzymes No results for input(s): TROPONINI in the last 168 hours. RADIOLOGY:  Dg Abd 1 View  Result Date: 12/03/2018 CLINICAL DATA:  Ureteral calculus EXAM: ABDOMEN - 1 VIEW COMPARISON:  CT dated December 02, 2018 FINDINGS: There has been interval placement of a double-J ureteral stent on the left. Multiple left-sided nephroliths are again identified. The previously noted right-sided nephroliths are not well visualized. There is a 6 mm stone along the course of the distal left ureter.  IMPRESSION: 1. Persistent 6 mm stone in the distal left ureter. 2. Again noted is left-sided nephrolithiasis. The right-sided nephrolithiasis is better visualized on prior CT. 3. Interval placement of a double-J ureteral stent. Electronically Signed   By: Katherine Mantlehristopher  Green M.D.   On: 12/03/2018 19:17   Dg Abd 1 View  Result Date: 12/03/2018 CLINICAL DATA:  Intraoperative localization for ureteral stent placement. EXAM: DG C-ARM 61-120 MIN; ABDOMEN - 1 VIEW COMPARISON:  Prior CT from 12/02/2018 FINDINGS: Few spot intraoperative fluoroscopic views for interval placement of a double-J left ureteral  stent. Provided images demonstrate the partially visualized proximal and distal coiled aspects of the stent. Images are used for intraoperative localization. Total fluoroscopy time equals 42 seconds. IMPRESSION: Intraoperative localization for ureteral stent placement. Electronically Signed   By: Rise MuBenjamin  McClintock M.D.   On: 12/03/2018 01:35   Ct Abdomen Pelvis W Contrast  Result Date: 12/02/2018 CLINICAL DATA:  Left lower quadrant abdominal pain. EXAM: CT ABDOMEN AND PELVIS WITH CONTRAST TECHNIQUE: Multidetector CT imaging of the abdomen and pelvis was performed using the standard protocol following bolus administration of intravenous contrast. CONTRAST:  100mL OMNIPAQUE IOHEXOL 300 MG/ML  SOLN COMPARISON:  None. FINDINGS: Lower chest: There is a trace right-sided pleural effusion.The heart size is normal. Hepatobiliary: There are multiple presumed hepatic cysts throughout the left and right hepatic lobes. Many of these are too small to characterize. Cholelithiasis without acute inflammation.There is no biliary ductal dilation. Pancreas: Normal contours without ductal dilatation. No peripancreatic fluid collection. Spleen: No splenic laceration or hematoma. Adrenals/Urinary Tract: --Adrenal glands: No adrenal hemorrhage. --Right kidney/ureter: There are multiple nonobstructing stones throughout the right kidney measuring up to approximately 5 mm. There is no right-sided hydronephrosis. --Left kidney/ureter: There is mild to moderate left-sided hydroureteronephrosis secondary to an obstructing 6 mm stone at the left UVJ. There are multiple additional nonobstructing stones throughout the left kidney. --Urinary bladder: Unremarkable. Stomach/Bowel: --Stomach/Duodenum: There is a small hiatal hernia. There is a posterior gastric diverticulum. --Small bowel: No dilatation or inflammation. --Colon: No focal abnormality. --Appendix: Normal. Vascular/Lymphatic: Normal course and caliber of the major abdominal  vessels. --No retroperitoneal lymphadenopathy. --No mesenteric lymphadenopathy. --No pelvic or inguinal lymphadenopathy. Reproductive: Unremarkable Other: No ascites or free air. The abdominal wall is normal. Musculoskeletal. No acute displaced fractures. IMPRESSION: 1. Mild to moderate left-sided hydroureteronephrosis secondary to an obstructing 6 mm stone in the distal left ureter at the left UVJ. 2. Bilateral nephrolithiasis. 3. Probable cholelithiasis without CT evidence for acute cholecystitis. 4. Trace right-sided pleural effusion. Electronically Signed   By: Katherine Mantlehristopher  Green M.D.   On: 12/02/2018 22:19   Dg C-arm 1-60 Min  Result Date: 12/03/2018 CLINICAL DATA:  Intraoperative localization for ureteral stent placement. EXAM: DG C-ARM 61-120 MIN; ABDOMEN - 1 VIEW COMPARISON:  Prior CT from 12/02/2018 FINDINGS: Few spot intraoperative fluoroscopic views for interval placement of a double-J left ureteral stent. Provided images demonstrate the partially visualized proximal and distal coiled aspects of the stent. Images are used for intraoperative localization. Total fluoroscopy time equals 42 seconds. IMPRESSION: Intraoperative localization for ureteral stent placement. Electronically Signed   By: Rise MuBenjamin  McClintock M.D.   On: 12/03/2018 01:35   ASSESSMENT AND PLAN:  Mary Chen  is a 63 y.o. female with a known history of vitamin D deficiency, who presented to the emergency room acute onset of left lower abdominal and flank pain that started earlier today with associated fever and chills as well as nausea and vomiting.     1.  E coli Sepsis with left ureteral stone with obstructive uropathy. - recieved hydration with IV normal saline --d/c now. Pt advised po fluids -IV meropenem for now and change once culture sensitivity is available--called lab said tomorrow - PRN antiemetics and pain management.  - Urology consultation was obtained by Dr. Lonna CobbStoioff and pt is s/p cysto- ureteroscopy with  Left ureteral stent placement  - Will follow urine culture.  2.  Sepsis secondary to #1.  Management as above. -  blood cultures 4/4 E coli -UC pending  3.  Vitamin D deficiency.   -continue her Drisdol.  4.  DVT prophylaxis.  Subcutaneous Lovenox.  5. Hypotension due to his sepsis -blood pressure improved   Overall moving in the right direction. Patient feels clinically better. Discussed with patient's daughter Annabelle HarmanDana on the phone.  Anticipating discharge in the morning tomorrow if she continues to remain stable.  Management plans discussed with the patient  CODE STATUS: FULL  DVT Prophylaxis: lovenox  TOTAL TIME TAKING CARE OF THIS PATIENT: *30* minutes.  >50% time spent on counselling and coordination of care  POSSIBLE D/C IN *1DAYS, DEPENDING ON CLINICAL CONDITION.  Note: This dictation was prepared with Dragon dictation along with smaller phrase technology. Any transcriptional errors that result from this process are unintentional.  Enedina FinnerSona Kaiel Weide M.D on 12/04/2018 at 9:14 AM  Between 7am to 6pm - Pager - [phone]  After 6pm go to [email or web address] - password [email or web address]  [facility]  Office  [phone]  CC: Primary care physician; Sherlene Shamsullo, Teresa L, MDPatient ID: Mary Chen, female   DOB: 11-08-1955, 63 y.o.   MRN: [id number]

## 2018-12-05 ENCOUNTER — Telehealth: Payer: Self-pay | Admitting: Internal Medicine

## 2018-12-05 LAB — URINE CULTURE: Culture: 60000 — AB

## 2018-12-05 LAB — CULTURE, BLOOD (ROUTINE X 2)
Special Requests: ADEQUATE
Special Requests: ADEQUATE

## 2018-12-05 LAB — HIV ANTIBODY (ROUTINE TESTING W REFLEX): HIV Screen 4th Generation wRfx: NONREACTIVE

## 2018-12-05 MED ORDER — CEPHALEXIN 500 MG PO CAPS: 500.0000 mg | ORAL_CAPSULE | Freq: Four times a day (QID) | ORAL | 0 refills | Status: AC

## 2018-12-05 MED ORDER — SODIUM CHLORIDE 0.9 % IV SOLN
2.0000 g | INTRAVENOUS | Status: DC
  Administered 2018-12-05: 10:00:00 2 g via INTRAVENOUS
  Filled 2018-12-05: qty 20
  Filled 2018-12-05: qty 2

## 2018-12-05 MED ORDER — CEPHALEXIN 500 MG PO CAPS: 500.0000 mg | ORAL_CAPSULE | Freq: Four times a day (QID) | ORAL | Status: DC

## 2018-12-05 NOTE — Telephone Encounter (Signed)
Pt is needing an appt for a HFU for Kidney stones  Per Nurse in about one week

## 2018-12-05 NOTE — Consult Note (Signed)
Urology Inpatient Progress Note  Subjective: Mary Chen is a 63 y.o. female who is POD2 from left cystoscopy with stent placement for left distal ureteral stone with urosepsis with Dr. Lonna CobbStoioff.  Today, patient appears well. She denies pelvic or flank pain. Patient's vitals are stable: she is afebrile, normotensive, and remains borderline tachycardic to the 100s (down from the 140s upon admission). Urine sensitivities returned resistant to ampicillin but otherwise sensitive.  Foley was in place at the time of rounding and output was clear yellow urine; nurse was at the bedside preparing to d/c Foley.  Anti-infectives: Anti-infectives (From admission, onward)   Start     Dose/Rate Route Frequency Ordered Stop   12/06/18 1000  cephALEXin (KEFLEX) capsule 500 mg     500 mg Oral 4 times daily 12/05/18 1007     12/06/18 0000  cephALEXin (KEFLEX) 500 MG capsule     500 mg Oral 4 times daily 12/05/18 1012 12/13/18 2359   12/05/18 0900  cefTRIAXone (ROCEPHIN) 2 g in sodium chloride 0.9 % 100 mL IVPB     2 g 200 mL/hr over 30 Minutes Intravenous Every 24 hours 12/05/18 0845     12/03/18 1200  meropenem (MERREM) 1 g in sodium chloride 0.9 % 100 mL IVPB  Status:  Discontinued     1 g 200 mL/hr over 30 Minutes Intravenous Every 8 hours 12/03/18 1018 12/05/18 0845   12/03/18 0800  piperacillin-tazobactam (ZOSYN) IVPB 3.375 g  Status:  Discontinued     3.375 g 12.5 mL/hr over 240 Minutes Intravenous Every 8 hours 12/03/18 0254 12/03/18 1018   12/03/18 0000  piperacillin-tazobactam (ZOSYN) IVPB 3.375 g  Status:  Discontinued     3.375 g 100 mL/hr over 30 Minutes Intravenous Every 6 hours 12/02/18 2354 12/03/18 0254   12/02/18 2300  piperacillin-tazobactam (ZOSYN) IVPB 3.375 g     3.375 g 100 mL/hr over 30 Minutes Intravenous  Once 12/02/18 2249 12/03/18 0018      Current Facility-Administered Medications  Medication Dose Route Frequency Provider Last Rate Last Dose  . acetaminophen  (TYLENOL) tablet 650 mg  650 mg Oral Q6H PRN Enedina FinnerPatel, Sona, MD       Or  . acetaminophen (TYLENOL) suppository 650 mg  650 mg Rectal Q6H PRN Enedina FinnerPatel, Sona, MD      . cefTRIAXone (ROCEPHIN) 2 g in sodium chloride 0.9 % 100 mL IVPB  2 g Intravenous Q24H Enedina FinnerPatel, Sona, MD 200 mL/hr at 12/05/18 0954 2 g at 12/05/18 0954  . [START ON 12/06/2018] cephALEXin (KEFLEX) capsule 500 mg  500 mg Oral QID Enedina FinnerPatel, Sona, MD      . enoxaparin (LOVENOX) injection 40 mg  40 mg Subcutaneous Q24H Mansy, Jan A, MD   40 mg at 12/04/18 1413  . ketorolac (TORADOL) 15 MG/ML injection 15 mg  15 mg Intravenous Q6H PRN Mansy, Jan A, MD      . magnesium hydroxide (MILK OF MAGNESIA) suspension 30 mL  30 mL Oral Daily PRN Mansy, Jan A, MD      . ondansetron [facility](ZOFRAN) tablet 4 mg  4 mg Oral Q6H PRN Mansy, Jan A, MD       Or  . ondansetron [facility](ZOFRAN) injection 4 mg  4 mg Intravenous Q6H PRN Mansy, Jan A, MD      . sodium chloride flush (NS) 0.9 % injection 3 mL  3 mL Intravenous Once Concha SeFunke, Mary E, MD      . Vitamin D (Ergocalciferol) (DRISDOL) capsule 50,000 Units  50,000 Units Oral Weekly Mansy, Jan A, MD   50,000 Units at 12/05/18 0850   Objective: Vital signs in last 24 hours: Temp:  [98.4 F (36.9 C)-99.5 F (37.5 C)] 99.5 F (37.5 C) (07/21 0435) Pulse Rate:  [89-94] 93 (07/21 0435) Resp:  [18-20] 18 (07/21 0435) BP: (107-122)/(74-83) 107/74 (07/21 0435) SpO2:  [95 %-98 %] 97 % (07/21 0435)  Intake/Output from previous day: 07/20 0701 - 07/21 0700 In: 780.3 [P.O.:480; IV Piggyback:300.3] Out: 2575 [Urine:2575] Intake/Output this shift: Total I/O In: -  Out: 650 [Urine:650]  Physical Exam Constitutional:      General: She is not in acute distress.    Appearance: She is not ill-appearing, toxic-appearing or diaphoretic.  HENT:     Head: Normocephalic and atraumatic.  Cardiovascular:     Rate and Rhythm: Tachycardia present.  Pulmonary:     Effort: Pulmonary effort is normal. No respiratory distress.  Abdominal:      General: Abdomen is protuberant. There is no distension.     Palpations: Abdomen is soft.     Tenderness: There is no abdominal tenderness. There is no left CVA tenderness.  Skin:    General: Skin is warm and dry.  Neurological:     Mental Status: She is alert and oriented to person, place, and time.  Psychiatric:        Mood and Affect: Mood normal.        Behavior: Behavior normal.    Lab Results:  Recent Labs    12/02/18 1751 12/03/18 0245  WBC 14.7* 11.0*  HGB 12.8 11.4*  HCT 41.2 36.2  PLT 219 140*   BMET Recent Labs    12/03/18 0245 12/04/18 0434  NA 140 144  K 3.0* 3.8  CL 111 116*  CO2 20* 20*  GLUCOSE 152* 99  BUN 22 16  CREATININE 1.14* 0.78  CALCIUM 7.8* 7.9*   Studies/Results: Dg Abd 1 View  Result Date: 12/03/2018 CLINICAL DATA:  Ureteral calculus EXAM: ABDOMEN - 1 VIEW COMPARISON:  CT dated December 02, 2018 FINDINGS: There has been interval placement of a double-J ureteral stent on the left. Multiple left-sided nephroliths are again identified. The previously noted right-sided nephroliths are not well visualized. There is a 6 mm stone along the course of the distal left ureter. IMPRESSION: 1. Persistent 6 mm stone in the distal left ureter. 2. Again noted is left-sided nephrolithiasis. The right-sided nephrolithiasis is better visualized on prior CT. 3. Interval placement of a double-J ureteral stent. Electronically Signed   By: Constance Holster M.D.   On: 12/03/2018 19:17   Assessment & Plan: Patient is clinically improving and has no questions or concerns at this time. Antibiotic therapy is appropriate per new sensitivity reports, no change indicated at this time. 2-week follow-up scheduled in clinic with Dr. Bernardo Heater to discuss stone management.   Counseled patient that flank pain, groin pain, and hematuria are normal with a stent in place. Counseled patient that she should follow up with [place] sooner than her planned appointment with new fevers or  chills.   Debroah Loop, PA-C 12/05/2018

## 2018-12-05 NOTE — Progress Notes (Signed)
MD ordered patient to be discharged home.  Discharge instructions were reviewed with the patient and she voiced understanding.  Follow-up appointment was made.  Prescriptions sent to the patients.  IV was removed with catheter intact.  All patients questions were answered.  Patient left via wheelchair escorted by NT.

## 2018-12-05 NOTE — Discharge Summary (Signed)
[facility] [place] [facility]   PATIENT NAME: Mary NapoleonDarlene Chen    MR#:  [id number]  DATE OF BIRTH:  Mar 17, 1956  DATE OF ADMISSION:  12/02/2018 ADMITTING PHYSICIAN: Hannah BeatJan A Mansy, MD  DATE OF DISCHARGE: 12/05/2018  PRIMARY CARE PHYSICIAN: Sherlene Shamsullo, Teresa L, MD    ADMISSION DIAGNOSIS:  Kidney stone on left side [N20.0] Sepsis, due to unspecified organism, unspecified whether acute organ dysfunction present (HCC) [A41.9]  DISCHARGE DIAGNOSIS:  E. coli sepsis E. coli UTI left ureteral stone status post stent placement  SECONDARY DIAGNOSIS:   Past Medical History:  Diagnosis Date  . Fibrocystic disease of breast   . Hx pulmonary embolism Nov 2009   postoperative, negaive Factor V Leuiden, neg LE ultrasound.   . Ovarian cyst     HOSPITAL COURSE:   DarleneFaucetteis a62 y.o.femalewith a known history of vitamin D deficiency, who presented to the emergency room acute onset of left lower abdominal and flank pain that started earlier today with associated fever and chills as well as nausea and vomiting.   1. E coli Sepsis with left ureteral stone with obstructive uropathy. - recieved hydration with IV normal saline --d/c now. Pt advised po fluids -IV meropenem for now and change to IV Rocephin today and PO Keflex Q ID for total 10 days  - PRN antiemetics and pain management.  -Urology consultation was obtained by Dr. Ricarda FrameStoioffand pt is s/p cysto-ureteroscopy with Left ureteral stent placement -- patient for will follow-up with Dr. Lonna CobbStoioff for definitive treatment as outpatient -DC Foley. Okay from urology standpoint for discharge  2. Sepsis secondary to #1. Management as above. -  blood cultures 4/4 E coli  3. Vitamin D deficiency.  -continue her Drisdol.  4. DVT prophylaxis. Subcutaneous Lovenox.  5. Hypotension due to his sepsis -blood pressure improved   Patient agreeable with discharge plan. This was discussed with patient's  daughter Mary HarmanDana on the phone. She is agreeable with the plan.  CONSULTS OBTAINED:  Treatment Team:  Riki AltesStoioff, Scott C, MD  DRUG ALLERGIES:  No Known Allergies  DISCHARGE MEDICATIONS:   Allergies as of 12/05/2018   No Known Allergies     Medication List    TAKE these medications   acetaminophen 500 MG tablet Commonly known as: TYLENOL Take 500-1,000 mg by mouth every 6 (six) hours as needed for mild pain, moderate pain or fever.   cephALEXin 500 MG capsule Commonly known as: KEFLEX Take 1 capsule (500 mg total) by mouth 4 (four) times daily for 7 days. Start taking on: December 06, 2018   ergocalciferol 1.25 MG (50000 UT) capsule Commonly known as: Drisdol Take 1 capsule (50,000 Units total) by mouth once a week.       If you experience worsening of your admission symptoms, develop shortness of breath, life threatening emergency, suicidal or homicidal thoughts you must seek medical attention immediately by calling 911 or calling your MD immediately  if symptoms less severe.  You Must read complete instructions/literature along with all the possible adverse reactions/side effects for all the Medicines you take and that have been prescribed to you. Take any new Medicines after you have completely understood and accept all the possible adverse reactions/side effects.   Please note  You were cared for by a hospitalist during your hospital stay. If you have any questions about your discharge medications or the care you received while you were in the hospital after you are discharged, you can call the unit and asked to speak  with the hospitalist on call if the hospitalist that took care of you is not available. Once you are discharged, your primary care physician will handle any further medical issues. Please note that NO REFILLS for any discharge medications will be authorized once you are discharged, as it is imperative that you return to your primary care physician (or establish a  relationship with a primary care physician if you do not have one) for your aftercare needs so that they can reassess your need for medications and monitor your lab values. Today   SUBJECTIVE  Doing well   VITAL SIGNS:  Blood pressure 107/74, pulse 93, temperature 99.5 F (37.5 C), temperature source Oral, resp. rate 18, height 5\' 6"  (1.676 m), weight 99.8 kg, SpO2 97 %.  I/O:    Intake/Output Summary (Last 24 hours) at 12/05/2018 1013 Last data filed at 12/05/2018 0853 Gross per 24 hour  Intake 540.25 ml  Output 3225 ml  Net -2684.75 ml    PHYSICAL EXAMINATION:  GENERAL:  63 y.o.-year-old patient lying in the bed with no acute distress.  EYES: Pupils equal, round, reactive to light and accommodation. No scleral icterus. Extraocular muscles intact.  HEENT: Head atraumatic, normocephalic. Oropharynx and nasopharynx clear.  NECK:  Supple, no jugular venous distention. No thyroid enlargement, no tenderness.  LUNGS: Normal breath sounds bilaterally, no wheezing, rales,rhonchi or crepitation. No use of accessory muscles of respiration.  CARDIOVASCULAR: S1, S2 normal. No murmurs, rubs, or gallops.  ABDOMEN: Soft, non-tender, non-distended. Bowel sounds present. No organomegaly or mass.  EXTREMITIES: No pedal edema, cyanosis, or clubbing.  NEUROLOGIC: Cranial nerves II through XII are intact. Muscle strength 5/5 in all extremities. Sensation intact. Gait not checked.  PSYCHIATRIC: The patient is alert and oriented x 3.  SKIN: No obvious rash, lesion, or ulcer.   DATA REVIEW:   CBC  Recent Labs  Lab 12/03/18 0245  WBC 11.0*  HGB 11.4*  HCT 36.2  PLT 140*    Chemistries  Recent Labs  Lab 12/02/18 1751  12/04/18 0434  NA 138   < > 144  K 3.7   < > 3.8  CL 105   < > 116*  CO2 20*   < > 20*  GLUCOSE 151*   < > 99  BUN 22   < > 16  CREATININE 0.78   < > 0.78  CALCIUM 9.1   < > 7.9*  MG  --   --  2.0  AST 18  --   --   ALT 28  --   --   ALKPHOS 70  --   --   BILITOT  2.3*  --   --    < > = values in this interval not displayed.    Microbiology Results   Recent Results (from the past 240 hour(s))  SARS Coronavirus 2 (CEPHEID - Performed in [facility] hospital lab), Hosp Order     Status: None   Collection Time: 12/02/18  8:25 PM   Specimen: Nasopharyngeal Swab  Result Value Ref Range Status   SARS Coronavirus 2 NEGATIVE NEGATIVE Final    Comment: (NOTE) If result is NEGATIVE SARS-CoV-2 target nucleic acids are NOT DETECTED. The SARS-CoV-2 RNA is generally detectable in upper and lower  respiratory specimens during the acute phase of infection. The lowest  concentration of SARS-CoV-2 viral copies this assay can detect is 250  copies / mL. A negative result does not preclude SARS-CoV-2 infection  and should not be used as the  sole basis for treatment or other  patient management decisions.  A negative result may occur with  improper specimen collection / handling, submission of specimen other  than nasopharyngeal swab, presence of viral mutation(s) within the  areas targeted by this assay, and inadequate number of viral copies  (<250 copies / mL). A negative result must be combined with clinical  observations, patient history, and epidemiological information. If result is POSITIVE SARS-CoV-2 target nucleic acids are DETECTED. The SARS-CoV-2 RNA is generally detectable in upper and lower  respiratory specimens dur ing the acute phase of infection.  Positive  results are indicative of active infection with SARS-CoV-2.  Clinical  correlation with patient history and other diagnostic information is  necessary to determine patient infection status.  Positive results do  not rule out bacterial infection or co-infection with other viruses. If result is PRESUMPTIVE POSTIVE SARS-CoV-2 nucleic acids MAY BE PRESENT.   A presumptive positive result was obtained on the submitted specimen  and confirmed on repeat testing.  While 2019 novel coronavirus   (SARS-CoV-2) nucleic acids may be present in the submitted sample  additional confirmatory testing may be necessary for epidemiological  and / or clinical management purposes  to differentiate between  SARS-CoV-2 and other Sarbecovirus currently known to infect humans.  If clinically indicated additional testing with an alternate test  methodology [phone]([id number]) is advised. The SARS-CoV-2 RNA is generally  detectable in upper and lower respiratory sp ecimens during the acute  phase of infection. The expected result is Negative. Fact Sheet for Patients:  BoilerBrush.com.cyhttps://www.fda.gov/media/136312/download Fact Sheet for Healthcare Providers: https://pope.com/https://www.fda.gov/media/136313/download This test is not yet approved or cleared by the [place] FDA and has been authorized for detection and/or diagnosis of SARS-CoV-2 by FDA under an Emergency Use Authorization (EUA).  This EUA will remain in effect (meaning this test can be used) for the duration of the COVID-19 declaration under Section 564(b)(1) of the Act, 21 U.S.C. section 360bbb-3(b)(1), unless the authorization is terminated or revoked sooner. Performed at [facility], [address] [place], [place] [address]   Blood Culture (routine x 2)     Status: Abnormal   Collection Time: 12/02/18 11:43 PM   Specimen: BLOOD  Result Value Ref Range Status   Specimen Description   Final    BLOOD LEFT ANTECUBITAL Performed at [facility], [address] [place], [place] [address]    Special Requests   Final    BOTTLES DRAWN AEROBIC AND ANAEROBIC Blood Culture adequate volume Performed at [facility], [address] [place], [place] [address]    Culture  Setup Time   Final    IN BOTH AEROBIC AND ANAEROBIC BOTTLES GRAM NEGATIVE RODS CRITICAL RESULT CALLED TO, READ BACK BY AND VERIFIED WITH: ABBY ELLINGTON AT 0959 ON 12/03/2018 JJB Performed at [facility], [address] [place], [place] [address]     Culture ESCHERICHIA COLI (A)  Final   Report Status 12/05/2018 FINAL  Final   Organism ID, Bacteria ESCHERICHIA COLI  Final      Susceptibility   Escherichia coli - MIC*    AMPICILLIN >=32 RESISTANT Resistant     CEFAZOLIN <=4 SENSITIVE Sensitive     CEFEPIME <=1 SENSITIVE Sensitive     CEFTAZIDIME <=1 SENSITIVE Sensitive     CEFTRIAXONE <=1 SENSITIVE Sensitive     CIPROFLOXACIN <=0.25 SENSITIVE Sensitive     GENTAMICIN <=1 SENSITIVE Sensitive     IMIPENEM <=0.25 SENSITIVE Sensitive     TRIMETH/SULFA <=  20 SENSITIVE Sensitive     AMPICILLIN/SULBACTAM 4 SENSITIVE Sensitive     PIP/TAZO <=4 SENSITIVE Sensitive     Extended ESBL NEGATIVE Sensitive     * ESCHERICHIA COLI  Blood Culture (routine x 2)     Status: Abnormal   Collection Time: 12/02/18 11:43 PM   Specimen: BLOOD  Result Value Ref Range Status   Specimen Description   Final    BLOOD RIGHT ANTECUBITAL Performed at [facility], [address] [place], [place] [address]    Special Requests   Final    BOTTLES DRAWN AEROBIC AND ANAEROBIC Blood Culture adequate volume Performed at [facility], [address] [place], [place] [address]    Culture  Setup Time   Final    IN BOTH AEROBIC AND ANAEROBIC BOTTLES GRAM NEGATIVE RODS CRITICAL RESULT CALLED TO, READ BACK BY AND VERIFIED WITH: ABBY ELLINGTON AT 059 ON 12/03/2018 JJB Performed at [facility], [address] [place], [place] [address]    Culture (A)  Final    ESCHERICHIA COLI SUSCEPTIBILITIES PERFORMED ON PREVIOUS CULTURE WITHIN THE LAST 5 DAYS. Performed at [facility], [address] [place], [place] [address]    Report Status 12/05/2018 FINAL  Final  Blood Culture ID Panel (Reflexed)     Status: Abnormal   Collection Time: 12/02/18 11:43 PM  Result Value Ref Range Status   Enterococcus species NOT DETECTED NOT DETECTED Final   Listeria monocytogenes NOT DETECTED NOT DETECTED Final   Staphylococcus species NOT DETECTED  NOT DETECTED Final   Staphylococcus aureus (BCID) NOT DETECTED NOT DETECTED Final   Streptococcus species NOT DETECTED NOT DETECTED Final   Streptococcus agalactiae NOT DETECTED NOT DETECTED Final   Streptococcus pneumoniae NOT DETECTED NOT DETECTED Final   Streptococcus pyogenes NOT DETECTED NOT DETECTED Final   Acinetobacter baumannii NOT DETECTED NOT DETECTED Final   Enterobacteriaceae species DETECTED (A) NOT DETECTED Final    Comment: Enterobacteriaceae represent a large family of gram-negative bacteria, not a single organism. CRITICAL RESULT CALLED TO, READ BACK BY AND VERIFIED WITH: ABBY ELLINGTON AT 0959 ON 12/03/2018 JJB    Enterobacter cloacae complex NOT DETECTED NOT DETECTED Final   Escherichia coli DETECTED (A) NOT DETECTED Final    Comment: CRITICAL RESULT CALLED TO, READ BACK BY AND VERIFIED WITH: ABBY ELLINGTON AT 0959 ON 12/03/2018 JJB    Klebsiella oxytoca NOT DETECTED NOT DETECTED Final   Klebsiella pneumoniae NOT DETECTED NOT DETECTED Final   Proteus species NOT DETECTED NOT DETECTED Final   Serratia marcescens NOT DETECTED NOT DETECTED Final   Carbapenem resistance NOT DETECTED NOT DETECTED Final   Haemophilus influenzae NOT DETECTED NOT DETECTED Final   Neisseria meningitidis NOT DETECTED NOT DETECTED Final   Pseudomonas aeruginosa NOT DETECTED NOT DETECTED Final   Candida albicans NOT DETECTED NOT DETECTED Final   Candida glabrata NOT DETECTED NOT DETECTED Final   Candida krusei NOT DETECTED NOT DETECTED Final   Candida parapsilosis NOT DETECTED NOT DETECTED Final   Candida tropicalis NOT DETECTED NOT DETECTED Final    Comment: Performed at [facility], [address] [place], [place] [address]  Urine Culture     Status: Abnormal   Collection Time: 12/03/18  1:12 AM   Specimen: [facility] Other; Urine  Result Value Ref Range Status   Specimen Description   Final    URINE, RANDOM CYSTOSCOPY Performed at [facility], [address]  [place], [place] [address]    Special Requests  Final    NONE Performed at [facility], [address] [place], [place] [address]    Culture 60,000 COLONIES/mL ESCHERICHIA COLI (A)  Final   Report Status 12/05/2018 FINAL  Final   Organism ID, Bacteria ESCHERICHIA COLI (A)  Final      Susceptibility   Escherichia coli - MIC*    AMPICILLIN >=32 RESISTANT Resistant     CEFAZOLIN <=4 SENSITIVE Sensitive     CEFTRIAXONE <=1 SENSITIVE Sensitive     CIPROFLOXACIN <=0.25 SENSITIVE Sensitive     GENTAMICIN <=1 SENSITIVE Sensitive     IMIPENEM <=0.25 SENSITIVE Sensitive     NITROFURANTOIN <=16 SENSITIVE Sensitive     TRIMETH/SULFA <=20 SENSITIVE Sensitive     AMPICILLIN/SULBACTAM 4 SENSITIVE Sensitive     PIP/TAZO <=4 SENSITIVE Sensitive     Extended ESBL NEGATIVE Sensitive     * 60,000 COLONIES/mL ESCHERICHIA COLI    RADIOLOGY:  Dg Abd 1 View  Result Date: 12/03/2018 CLINICAL DATA:  Ureteral calculus EXAM: ABDOMEN - 1 VIEW COMPARISON:  CT dated December 02, 2018 FINDINGS: There has been interval placement of a double-J ureteral stent on the left. Multiple left-sided nephroliths are again identified. The previously noted right-sided nephroliths are not well visualized. There is a 6 mm stone along the course of the distal left ureter. IMPRESSION: 1. Persistent 6 mm stone in the distal left ureter. 2. Again noted is left-sided nephrolithiasis. The right-sided nephrolithiasis is better visualized on prior CT. 3. Interval placement of a double-J ureteral stent. Electronically Signed   By: Katherine Mantle M.D.   On: 12/03/2018 19:17     CODE STATUS:     Code Status Orders  (From admission, onward)         Start     Ordered   12/02/18 2352  Full code  Continuous     12/02/18 2354        Code Status History    This patient has a current code status but no historical code status.   Advance Care Planning Activity      TOTAL TIME TAKING CARE OF THIS PATIENT: *40* minutes.     Enedina Finner M.D on 12/05/2018 at 10:13 AM  Between 7am to 6pm - Pager - [phone] After 6pm go to [email or web address] - Social research officer, government  [facility]  Office  [phone]  CC: Primary care physician; Sherlene Shams, MD

## 2018-12-06 ENCOUNTER — Other Ambulatory Visit: Payer: Self-pay | Admitting: *Deleted

## 2018-12-06 NOTE — Telephone Encounter (Signed)
First attempt for Transitional Care Management call, left message for patient to cal office , will continue to monitor and attempt TCM. 

## 2018-12-06 NOTE — Patient Outreach (Signed)
[facility]) Care Management  12/06/2018  Mary Chen 12-16-1955 [id number]   Transition of care call/Case Closure   Referral received : 12/05/18 Initial outreach : 12/06/2018 Insurance: [employer]   Subjective Initial successful telephone outreach call to patient in order to complete transition of care assessment: 2 HIPAA identifiers . Explained purpose of the call and completed transition of care assessment.  Patient states that she is doing better than she was, she denies post operative problems, she states surgical pain is well managed with prescribed medications using tylenol as needed. Patient discussed appetite is a little slow so she is taking it easy tolerating fluids well tolerating toast and applesauce this morning she denies nausea. Patient reports that her urine is yellow colored, no blood noted. She report no signs of fever and has thermometer in place to monitor .  She report tolerating activity in home, slowly. Patient reports that she has family member  in home to assist her as needed. Patient discussed she had prescription for Keflex filled at [facility] outpatient  pharmacy and began taking today as prescribed.   Patient denies any ongoing health issues and says that she is does need a referral to one of the [place] chronic disease management.  program. Patient denies education material on chronic conditions. Patient has made contact with human resources regarding FMLA for her time off , she is anticipating return to work in less than 14 days.  Objective Mary Chen was hospitalized at [facility] from 7/19-7/21 for sepsis, Left kidney stone and Left ureteral stent placement.  Comorbidities include: vitamin D deficiency, Diabetes , A1c 6.5 . Assessment  Patient voices good understanding of discharge instructions. See Transition of care flow sheet for assessment details  Plan Reviewed hospital discharge diagnosis of  sepsis and left kidney stone , with left ureteral stent placement  and treatment plan using hospital discharge instructions, assessing medication adherence, reviewing postoperative problems requiring provider notification and discussing the importance of follow up with surgeon, primary care provider and specialist as directed.  Reviewed [place]'s Active Health Management 2020 Wellness Requirements of : Completing the computerized Health Assessment and the Health Action Step with Active Health Management [facility]) by January 16 2019 AND have an annual physical between May 17, 2017 and January 16, 2019 in order to qualify for the 2021 Healthy Lifestyle Premium.  Reviewed [place]'s chronic disease management program benefit with Active Health Management and encouraged to contact them at [phone] or at [email or web address] to enroll or for questions about managing his /her chronic disease states.  .  No ongoing care management needs identified so will close case to [place]  Management  services and route successful outreach letter with [facility] pamphlet and 24 Hour Nurse Line Magnet to [facility] clinical pool to be mailed to patient's home address.   Joylene Draft, RN, [place] Management Coordinator  [phone]- Mobile [phone]- Toll Free Main Office

## 2018-12-07 NOTE — Telephone Encounter (Signed)
Transition Care Management Follow-up Telephone Call  How have you been since you were released from the hospital? Patient stated she feels much better that the stent in not comfortable.   Do you understand why you were in the hospital? yes   Do you understand the discharge instrcutions? yes  Items Reviewed:  Medications reviewed: yes  Allergies reviewed: yes  Dietary changes reviewed: yes  Referrals reviewed: yes   Functional Questionnaire:   Activities of Daily Living (ADLs):   She states they are independent in the following: ambulation, bathing and hygiene, feeding, continence, grooming, toileting and dressing States they require assistance with the following: No assistance needed.   Any transportation issues/concerns?: no   Any patient concerns? no   Confirmed importance and date/time of follow-up visits scheduled: yes   Confirmed with patient if condition begins to worsen call PCP or go to the ER.  Patient was given the Call-a-Nurse line [phone]: yes

## 2018-12-14 ENCOUNTER — Ambulatory Visit (INDEPENDENT_AMBULATORY_CARE_PROVIDER_SITE_OTHER): Payer: 59 | Admitting: Internal Medicine

## 2018-12-14 ENCOUNTER — Other Ambulatory Visit: Payer: Self-pay

## 2018-12-14 ENCOUNTER — Encounter: Payer: Self-pay | Admitting: Internal Medicine

## 2018-12-14 VITALS — BP 130/88 | HR 90 | Temp 97.6°F | Resp 15 | Ht 66.0 in | Wt 224.8 lb

## 2018-12-14 DIAGNOSIS — N139 Obstructive and reflux uropathy, unspecified: Secondary | ICD-10-CM | POA: Diagnosis not present

## 2018-12-14 DIAGNOSIS — A4151 Sepsis due to Escherichia coli [E. coli]: Secondary | ICD-10-CM | POA: Diagnosis not present

## 2018-12-14 DIAGNOSIS — Z0279 Encounter for issue of other medical certificate: Secondary | ICD-10-CM

## 2018-12-14 DIAGNOSIS — N200 Calculus of kidney: Secondary | ICD-10-CM | POA: Diagnosis not present

## 2018-12-14 DIAGNOSIS — E876 Hypokalemia: Secondary | ICD-10-CM | POA: Diagnosis not present

## 2018-12-14 DIAGNOSIS — E119 Type 2 diabetes mellitus without complications: Secondary | ICD-10-CM

## 2018-12-14 DIAGNOSIS — R6520 Severe sepsis without septic shock: Secondary | ICD-10-CM

## 2018-12-14 DIAGNOSIS — Z09 Encounter for follow-up examination after completed treatment for conditions other than malignant neoplasm: Secondary | ICD-10-CM | POA: Diagnosis not present

## 2018-12-14 DIAGNOSIS — N179 Acute kidney failure, unspecified: Secondary | ICD-10-CM | POA: Diagnosis not present

## 2018-12-14 NOTE — Patient Instructions (Signed)
Taking an antibiotic can create an imbalance in the normal population of bacteria that live in the small intestine.  This imbalance can persist for 3 months.   Taking a probiotic ( Align, Floraque or Culturelle), the generic version of one of these over the counter medications, or an alternative form (kombucha,  Yogurt, or another dietary source) for a minimum of 3 weeks may help prevent a serious antibiotic associated diarrhea  Called clostridium dificile colitis that occurs when the bacteria population is altered .  Taking a probiotic may also prevent vaginitis due to yeast infections and can be continued indefinitely if you feel that it improves your digestion or your elimination (bowels).

## 2018-12-14 NOTE — Progress Notes (Signed)
Subjective:  Patient ID: Mary Chen, female    DOB: 1956-01-19  Age: 63 y.o. MRN: [id number]  CC: The primary encounter diagnosis was Hospital discharge follow-up. Diagnoses of Obstructive uropathy, Bilateral nephrolithiasis, Controlled type 2 diabetes mellitus without complication, without long-term current use of insulin (HCC), Hypokalemia, Sepsis due to Escherichia coli, unspecified whether acute organ dysfunction present (HCC), and Sepsis due to Escherichia coli with acute renal failure without septic shock, unspecified acute renal failure type [facility](HCC) were also pertinent to this visit.   HPI Mary HalfDarlene P Yielding presents for hospital follow up . Patient [facility] on July 18 with left flank pai, fever, nausea and vomiting and diagnosed with E Coli sepsis secondary to an obstructing calculi in the left ureter.  CT scan noted multiple renal calculi bilaterally, with left hydronephrosis and proximal hydroureter.  She was treated with IV antibiotics for the sepsis and underwent placement of a left ureteral stent by  Dr Lonna CobbStoioff .  Her antibiotics were changed to keflex prior to discharge to home on July 21.  She was not given a urine strainer and is not sure if she has passed any stones.  She reports mild left upper quadrant pain that is intermittent.  She has finished the antibiotics (10 day total course)   And denies fevers, nausea and diarrhea,  She is not taking an antibiotic.   She has been out of work since the day of admission. She works from home and feels ready to return to work on Monday, August 3  She Has follow up on August 14 with Dr. Lonna CobbStoioff  Outpatient Medications Prior to Visit  Medication Sig Dispense Refill   acetaminophen (TYLENOL) 500 MG tablet Take 500-1,000 mg by mouth every 6 (six) hours as needed for mild pain, moderate pain or fever.     ergocalciferol (DRISDOL) 1.25 MG (50000 UT) capsule Take 1 capsule (50,000 Units total) by mouth once a week. 12 capsule 3    No facility-administered medications prior to visit.     Review of Systems;  Patient denies headache, fevers, malaise, unintentional weight loss, skin rash, eye pain, sinus congestion and sinus pain, sore throat, dysphagia,  hemoptysis , cough, dyspnea, wheezing, chest pain, palpitations, orthopnea, edema, abdominal pain, nausea, melena,, dysuria, hematuria, urinary  Frequency, nocturia, numbness, tingling, seizures,  Focal weakness, Loss of consciousness,  Tremor, insomnia, depression, anxiety, and suicidal ideation.      Objective:  BP 130/88 (BP Location: Left Arm, Patient Position: Sitting, Cuff Size: Large)    Pulse 90    Temp 97.6 F (36.4 C) (Oral)    Resp 15    Ht 5\' 6"  (1.676 m)    Wt 224 lb 12.8 oz (102 kg)    SpO2 95%    BMI 36.28 kg/m   BP Readings from Last 3 Encounters:  12/14/18 130/88  12/05/18 107/74  10/11/18 120/86    Wt Readings from Last 3 Encounters:  12/14/18 224 lb 12.8 oz (102 kg)  12/02/18 220 lb (99.8 kg)  10/11/18 236 lb (107 kg)    General appearance: alert, cooperative and appears stated age Ears: normal TM's and external ear canals both ears Throat: lips, mucosa, and tongue normal; teeth and gums normal Neck: no adenopathy, no carotid bruit, supple, symmetrical, trachea midline and thyroid not enlarged, symmetric, no tenderness/mass/nodules Back: symmetric, no curvature. ROM normal. No CVA tenderness. Lungs: clear to auscultation bilaterally Heart: regular rate and rhythm, S1, S2 normal, no murmur, click, rub or gallop  Abdomen: soft, non-tender; bowel sounds normal; no masses,  no organomegaly Pulses: 2+ and symmetric Skin: Skin color, texture, turgor normal. No rashes or lesions Lymph nodes: Cervical, supraclavicular, and axillary nodes normal.  Lab Results  Component Value Date   HGBA1C 6.5 10/11/2018   HGBA1C 6.3 09/30/2017   HGBA1C 6.2 04/21/2016    Lab Results  Component Value Date   CREATININE 0.78 12/04/2018   CREATININE 1.14  (H) 12/03/2018   CREATININE 0.78 12/02/2018    Lab Results  Component Value Date   WBC 11.0 (H) 12/03/2018   HGB 11.4 (L) 12/03/2018   HCT 36.2 12/03/2018   PLT 140 (L) 12/03/2018   GLUCOSE 99 12/04/2018   CHOL 256 (H) 10/11/2018   TRIG 116.0 10/11/2018   HDL 58.60 10/11/2018   LDLDIRECT 167.0 04/21/2016   LDLCALC 174 (H) 10/11/2018   ALT 28 12/02/2018   AST 18 12/02/2018   NA 144 12/04/2018   K 3.8 12/04/2018   CL 116 (H) 12/04/2018   CREATININE 0.78 12/04/2018   BUN 16 12/04/2018   CO2 20 (L) 12/04/2018   TSH 1.47 10/11/2018   HGBA1C 6.5 10/11/2018   MICROALBUR 4.1 (H) 04/21/2016    Dg Abd 1 View  Result Date: 12/03/2018 CLINICAL DATA:  Ureteral calculus EXAM: ABDOMEN - 1 VIEW COMPARISON:  CT dated December 02, 2018 FINDINGS: There has been interval placement of a double-J ureteral stent on the left. Multiple left-sided nephroliths are again identified. The previously noted right-sided nephroliths are not well visualized. There is a 6 mm stone along the course of the distal left ureter. IMPRESSION: 1. Persistent 6 mm stone in the distal left ureter. 2. Again noted is left-sided nephrolithiasis. The right-sided nephrolithiasis is better visualized on prior CT. 3. Interval placement of a double-J ureteral stent. Electronically Signed   By: Katherine Mantlehristopher  Green M.D.   On: 12/03/2018 19:17   Dg Abd 1 View  Result Date: 12/03/2018 CLINICAL DATA:  Intraoperative localization for ureteral stent placement. EXAM: DG C-ARM 61-120 MIN; ABDOMEN - 1 VIEW COMPARISON:  Prior CT from 12/02/2018 FINDINGS: Few spot intraoperative fluoroscopic views for interval placement of a double-J left ureteral stent. Provided images demonstrate the partially visualized proximal and distal coiled aspects of the stent. Images are used for intraoperative localization. Total fluoroscopy time equals 42 seconds. IMPRESSION: Intraoperative localization for ureteral stent placement. Electronically Signed   By: Rise MuBenjamin   McClintock M.D.   On: 12/03/2018 01:35   Ct Abdomen Pelvis W Contrast  Result Date: 12/02/2018 CLINICAL DATA:  Left lower quadrant abdominal pain. EXAM: CT ABDOMEN AND PELVIS WITH CONTRAST TECHNIQUE: Multidetector CT imaging of the abdomen and pelvis was performed using the standard protocol following bolus administration of intravenous contrast. CONTRAST:  100mL OMNIPAQUE IOHEXOL 300 MG/ML  SOLN COMPARISON:  None. FINDINGS: Lower chest: There is a trace right-sided pleural effusion.The heart size is normal. Hepatobiliary: There are multiple presumed hepatic cysts throughout the left and right hepatic lobes. Many of these are too small to characterize. Cholelithiasis without acute inflammation.There is no biliary ductal dilation. Pancreas: Normal contours without ductal dilatation. No peripancreatic fluid collection. Spleen: No splenic laceration or hematoma. Adrenals/Urinary Tract: --Adrenal glands: No adrenal hemorrhage. --Right kidney/ureter: There are multiple nonobstructing stones throughout the right kidney measuring up to approximately 5 mm. There is no right-sided hydronephrosis. --Left kidney/ureter: There is mild to moderate left-sided hydroureteronephrosis secondary to an obstructing 6 mm stone at the left UVJ. There are multiple additional nonobstructing stones throughout the left kidney. --Urinary bladder:  Unremarkable. Stomach/Bowel: --Stomach/Duodenum: There is a small hiatal hernia. There is a posterior gastric diverticulum. --Small bowel: No dilatation or inflammation. --Colon: No focal abnormality. --Appendix: Normal. Vascular/Lymphatic: Normal course and caliber of the major abdominal vessels. --No retroperitoneal lymphadenopathy. --No mesenteric lymphadenopathy. --No pelvic or inguinal lymphadenopathy. Reproductive: Unremarkable Other: No ascites or free air. The abdominal wall is normal. Musculoskeletal. No acute displaced fractures. IMPRESSION: 1. Mild to moderate left-sided  hydroureteronephrosis secondary to an obstructing 6 mm stone in the distal left ureter at the left UVJ. 2. Bilateral nephrolithiasis. 3. Probable cholelithiasis without CT evidence for acute cholecystitis. 4. Trace right-sided pleural effusion. Electronically Signed   By: Constance Holster M.D.   On: 12/02/2018 22:19   Dg C-arm 1-60 Min  Result Date: 12/03/2018 CLINICAL DATA:  Intraoperative localization for ureteral stent placement. EXAM: DG C-ARM 61-120 MIN; ABDOMEN - 1 VIEW COMPARISON:  Prior CT from 12/02/2018 FINDINGS: Few spot intraoperative fluoroscopic views for interval placement of a double-J left ureteral stent. Provided images demonstrate the partially visualized proximal and distal coiled aspects of the stent. Images are used for intraoperative localization. Total fluoroscopy time equals 42 seconds. IMPRESSION: Intraoperative localization for ureteral stent placement. Electronically Signed   By: Jeannine Boga M.D.   On: 12/03/2018 01:35    Assessment & Plan:   Problem List Items Addressed This Visit      Unprioritized   Obstructive uropathy    secondary to 6 mm left ureteral stone.  Sp stent placement . Follow up with [facility] Aug 14       Hypokalemia    Secondary to GI losses.  She received IV hydration during admission and is taking full PO with good appetite.       Relevant Orders   Comprehensive metabolic panel   Basic metabolic panel   Hospital discharge follow-up - Primary    Patient is stable post discharge and has no new issues or questions about her discharge plans.  All questions raised were answered.  FMLA forms were completed during visit with return to work planned or august 3      E. coli sepsis [facility])    She completed ten days of antibiotics for E Coli sepsis secondary to UTI with obstructive uropathy      Relevant Orders   CBC with Differential/Platelet   C-reactive protein   Diabetes mellitus type II, controlled, with no complications (HCC)     Progression from prediabetes,  Last a1c was 6.5 .  counselling given. rtc in august for labs       Relevant Orders   Hemoglobin A1c   Bilateral nephrolithiasis    multiple nonobstructing stones were also noted on CT scan .  Advised empirically to increase citric acid intake with fresh lime,orange or lemon juice daily and increase hydration with a minimum of 60 ounces of water daily          I am having Zyasia P. Wissing maintain her ergocalciferol and acetaminophen.  No orders of the defined types were placed in this encounter.   There are no discontinued medications.  Follow-up: No follow-ups on file.   Crecencio Mc, MD

## 2018-12-15 ENCOUNTER — Telehealth: Payer: Self-pay | Admitting: *Deleted

## 2018-12-15 NOTE — Telephone Encounter (Signed)
Spoke with pt to let her know that letter has been plaed up front for pick up.

## 2018-12-15 NOTE — Telephone Encounter (Signed)
When is patient allowed to return to work?

## 2018-12-15 NOTE — Telephone Encounter (Signed)
Letter has been prepared and is on printer

## 2018-12-15 NOTE — Telephone Encounter (Signed)
Copied from [id number]. Topic: General - Other >> Dec 15, 2018  2:34 PM Nils Flack, Marland Kitchen wrote: Reason for CRM: pt needs a return to work note. She was seen yesterday for hosp follow up.  Please call when ready for pick up

## 2018-12-16 ENCOUNTER — Telehealth: Payer: Self-pay | Admitting: Internal Medicine

## 2018-12-16 DIAGNOSIS — A4151 Sepsis due to Escherichia coli [E. coli]: Secondary | ICD-10-CM | POA: Insufficient documentation

## 2018-12-16 DIAGNOSIS — Z09 Encounter for follow-up examination after completed treatment for conditions other than malignant neoplasm: Secondary | ICD-10-CM | POA: Insufficient documentation

## 2018-12-16 DIAGNOSIS — E876 Hypokalemia: Secondary | ICD-10-CM | POA: Insufficient documentation

## 2018-12-16 DIAGNOSIS — N200 Calculus of kidney: Secondary | ICD-10-CM | POA: Insufficient documentation

## 2018-12-16 NOTE — Assessment & Plan Note (Signed)
She completed ten days of antibiotics for E Coli sepsis secondary to UTI with obstructive uropathy

## 2018-12-16 NOTE — Assessment & Plan Note (Signed)
secondary to 6 mm left ureteral stone.  Sp stent placement . Follow up with [facility] Aug 14

## 2018-12-16 NOTE — Assessment & Plan Note (Signed)
multiple nonobstructing stones were also noted on CT scan .  Advised empirically to increase citric acid intake with fresh lime,orange or lemon juice daily and increase hydration with a minimum of 60 ounces of water daily

## 2018-12-16 NOTE — Assessment & Plan Note (Signed)
Patient is stable post discharge and has no new issues or questions about her discharge plans.  All questions raised were answered.  FMLA forms were completed during visit with return to work planned or august 3

## 2018-12-16 NOTE — Assessment & Plan Note (Signed)
Progression from prediabetes,  Last a1c was 6.5 .  counselling given. rtc in august for labs

## 2018-12-16 NOTE — Telephone Encounter (Signed)
She had a few abnormal labs that needed repeating.  Can she come back this week to have the labs done? No fasting required : crp, cbc  And basic metabolic panel.    The others that have been ordered are for her  6 month follow up on diabetes  In November and that appt needs to be made

## 2018-12-16 NOTE — Assessment & Plan Note (Signed)
Secondary to GI losses.  She received IV hydration during admission and is taking full PO with good appetite.

## 2018-12-18 NOTE — Telephone Encounter (Signed)
Just needs to say that in the appt note. Thanks

## 2018-12-18 NOTE — Telephone Encounter (Signed)
Pt is scheduled for repeat lab work. Pt is aware of appt date and time.

## 2018-12-20 ENCOUNTER — Other Ambulatory Visit: Payer: Self-pay

## 2018-12-20 ENCOUNTER — Other Ambulatory Visit (INDEPENDENT_AMBULATORY_CARE_PROVIDER_SITE_OTHER): Payer: 59

## 2018-12-20 DIAGNOSIS — A4151 Sepsis due to Escherichia coli [E. coli]: Secondary | ICD-10-CM

## 2018-12-20 DIAGNOSIS — E119 Type 2 diabetes mellitus without complications: Secondary | ICD-10-CM | POA: Diagnosis not present

## 2018-12-20 DIAGNOSIS — N179 Acute kidney failure, unspecified: Secondary | ICD-10-CM | POA: Diagnosis not present

## 2018-12-20 DIAGNOSIS — E876 Hypokalemia: Secondary | ICD-10-CM | POA: Diagnosis not present

## 2018-12-20 DIAGNOSIS — R6520 Severe sepsis without septic shock: Secondary | ICD-10-CM

## 2018-12-21 LAB — COMPREHENSIVE METABOLIC PANEL
ALT: 12 U/L (ref 0–35)
AST: 11 U/L (ref 0–37)
Albumin: 4 g/dL (ref 3.5–5.2)
Alkaline Phosphatase: 83 U/L (ref 39–117)
BUN: 15 mg/dL (ref 6–23)
CO2: 26 mEq/L (ref 19–32)
Calcium: 9.7 mg/dL (ref 8.4–10.5)
Chloride: 106 mEq/L (ref 96–112)
Creatinine, Ser: 0.68 mg/dL (ref 0.40–1.20)
GFR: 105.48 mL/min (ref >60.00)
Glucose, Bld: 81 mg/dL (ref 70–99)
Potassium: 4.1 mEq/L (ref 3.5–5.1)
Sodium: 142 mEq/L (ref 135–145)
Total Bilirubin: 0.8 mg/dL (ref 0.2–1.2)
Total Protein: 7.1 g/dL (ref 6.0–8.3)

## 2018-12-21 LAB — BASIC METABOLIC PANEL
BUN: 15 mg/dL (ref 6–23)
CO2: 26 mEq/L (ref 19–32)
Calcium: 9.7 mg/dL (ref 8.4–10.5)
Chloride: 106 mEq/L (ref 96–112)
Creatinine, Ser: 0.68 mg/dL (ref 0.40–1.20)
GFR: 105.48 mL/min (ref >60.00)
Glucose, Bld: 81 mg/dL (ref 70–99)
Potassium: 4.1 mEq/L (ref 3.5–5.1)
Sodium: 142 mEq/L (ref 135–145)

## 2018-12-21 LAB — CBC WITH DIFFERENTIAL/PLATELET
Basophils Absolute: 0.1 10*3/uL (ref 0.0–0.1)
Basophils Relative: 1.9 % (ref 0.0–3.0)
Eosinophils Absolute: 0.1 10*3/uL (ref 0.0–0.7)
Eosinophils Relative: 1.3 % (ref 0.0–5.0)
HCT: 39 % (ref 36.0–46.0)
Hemoglobin: 12.2 g/dL (ref 12.0–15.0)
Lymphocytes Relative: 35.1 % (ref 12.0–46.0)
Lymphs Abs: 2.3 10*3/uL (ref 0.7–4.0)
MCHC: 31.4 g/dL (ref 30.0–36.0)
MCV: 70.4 fl — ABNORMAL LOW (ref 78.0–100.0)
Monocytes Absolute: 0.8 10*3/uL (ref 0.1–1.0)
Monocytes Relative: 12.2 % — ABNORMAL HIGH (ref 3.0–12.0)
Neutro Abs: 3.2 10*3/uL (ref 1.4–7.7)
Neutrophils Relative %: 49.5 % (ref 43.0–77.0)
Platelets: 340 10*3/uL (ref 150.0–400.0)
RBC: 5.54 Mil/uL — ABNORMAL HIGH (ref 3.87–5.11)
RDW: 16 % — ABNORMAL HIGH (ref 11.5–15.5)
WBC: 6.4 10*3/uL (ref 4.0–10.5)

## 2018-12-21 LAB — C-REACTIVE PROTEIN: CRP: 5.4 mg/dL (ref 0.5–20.0)

## 2018-12-21 LAB — HEMOGLOBIN A1C: Hgb A1c MFr Bld: 6.2 % (ref 4.6–6.5)

## 2018-12-29 ENCOUNTER — Ambulatory Visit
Admission: RE | Admit: 2018-12-29 | Discharge: 2018-12-29 | Disposition: A | Discharge status: Home / Self Care | Payer: 59 | Source: Ambulatory Visit | Attending: Urology | Admitting: Urology

## 2018-12-29 ENCOUNTER — Encounter: Payer: Self-pay | Admitting: Urology

## 2018-12-29 ENCOUNTER — Other Ambulatory Visit: Payer: Self-pay

## 2018-12-29 ENCOUNTER — Ambulatory Visit (INDEPENDENT_AMBULATORY_CARE_PROVIDER_SITE_OTHER): Payer: 59 | Admitting: Urology

## 2018-12-29 VITALS — BP 120/83 | HR 97 | Ht 66.5 in | Wt 225.0 lb

## 2018-12-29 DIAGNOSIS — N200 Calculus of kidney: Secondary | ICD-10-CM | POA: Diagnosis not present

## 2018-12-29 DIAGNOSIS — N201 Calculus of ureter: Secondary | ICD-10-CM

## 2018-12-29 NOTE — H&P (View-Only) (Signed)
12/29/2018 2:25 PM   Mary Chen Apr 01, 1956 [id number]  Referring provider: Crecencio Mc, MD [address] [place],  [place] [address]  Chief Complaint  Patient presents with  . Nephrolithiasis    HPI: 63 y.o. female who presents for hospital follow-up.  She presented to the ED on 12/02/2018 with acute onset of left lower quadrant abdominal pain radiating to the left flank associated with nausea and vomiting and temp to 103.  CT was remarkable for 6 mm left distal ureteral calculus with upstream hydronephrosis.  She also had bilateral, nonobstructing renal calculi.  She underwent urgent ureteral stenting with resolution of her pain and clinical improvement.  Urine and blood cultures positive for E. coli.  She has completed her antibiotics and has no complaints.  She has minimal stent symptoms.  Denies recurrent fever.    PMH: Past Medical History:  Diagnosis Date  . Fibrocystic disease of breast   . Hx pulmonary embolism Nov 2009   postoperative, negaive Factor V Leuiden, neg LE ultrasound.   . Ovarian cyst     Surgical History: Past Surgical History:  Procedure Laterality Date  . ABDOMINAL HYSTERECTOMY    . BREAST BIOPSY Left    neg  . BREAST EXCISIONAL BIOPSY Left    neg  . CYSTOSCOPY WITH STENT PLACEMENT Left 12/03/2018   Procedure: CYSTOSCOPY WITH STENT PLACEMENT;  Surgeon: Abbie Sons, MD;  Location: [facility] ORS;  Service: Urology;  Laterality: Left;  . TOTAL ABDOMINAL HYSTERECTOMY W/ BILATERAL SALPINGOOPHORECTOMY  2009   for bilateral ovarian cysts and fibroid uterus    Home Medications:  Allergies as of 12/29/2018   No Known Allergies     Medication List       Accurate as of December 29, 2018  2:25 PM. If you have any questions, ask your nurse or doctor.        acetaminophen 500 MG tablet Commonly known as: TYLENOL Take 500-1,000 mg by mouth every 6 (six) hours as needed for mild pain, moderate pain or fever.   ergocalciferol 1.25  MG (50000 UT) capsule Commonly known as: Drisdol Take 1 capsule (50,000 Units total) by mouth once a week.       Allergies: No Known Allergies  Family History: Family History  Problem Relation Age of Onset  . Cancer Mother        breast  . Breast cancer Mother 84  . Bipolar disorder Brother        self inficted GSW   . Cancer Father 92       lung Ca mets to liver     Social History:  reports that she has never smoked. She has never used smokeless tobacco. She reports that she does not drink alcohol or use drugs.  ROS: UROLOGY Frequent Urination?: No Hard to postpone urination?: No Burning/pain with urination?: No Get up at night to urinate?: No Leakage of urine?: No Urine stream starts and stops?: No Trouble starting stream?: No Do you have to strain to urinate?: No Blood in urine?: No Urinary tract infection?: No Sexually transmitted disease?: No Injury to kidneys or bladder?: No Painful intercourse?: No Weak stream?: No Currently pregnant?: No Vaginal bleeding?: No Last menstrual period?: Postmenopausal  Gastrointestinal Nausea?: No Vomiting?: No Indigestion/heartburn?: No Diarrhea?: No Constipation?: No  Constitutional Fever: No Night sweats?: No Weight loss?: No Fatigue?: No  Skin Skin rash/lesions?: No Itching?: No  Eyes Blurred vision?: No Double vision?: No  Ears/Nose/Throat Sore throat?: No Sinus problems?:  No  Hematologic/Lymphatic Swollen glands?: No Easy bruising?: No  Cardiovascular Leg swelling?: No Chest pain?: No  Respiratory Cough?: No Shortness of breath?: No  Endocrine Excessive thirst?: No  Musculoskeletal Back pain?: No  Neurological Headaches?: No Dizziness?: No  Psychologic Depression?: No Anxiety?: No  Physical Exam: BP 120/83 (BP Location: Left Arm, Patient Position: Sitting, Cuff Size: Large)   Pulse 97   Ht 5' 6.5" (1.689 m)   Wt 225 lb (102.1 kg)   BMI 35.77 kg/m   Constitutional:  Alert  and oriented, No acute distress. HEENT: [place] AT, moist mucus membranes.  Trachea midline, no masses. Cardiovascular: No clubbing, cyanosis, or edema.  RRR Respiratory: Normal respiratory effort, no increased work of breathing.  Clear GI: Abdomen is soft, nontender, nondistended, no abdominal masses GU: No CVA tenderness Lymph: No cervical or inguinal lymphadenopathy. Skin: No rashes, bruises or suspicious lesions. Neurologic: Grossly intact, no focal deficits, moving all 4 extremities. Psychiatric: Normal mood and affect.   Assessment & Plan:    - Left distal ureteral calculus Status post urgent stenting for sepsis from urinary source.  We discussed treatment options of her ureteral calculus including shockwave lithotripsy and ureteroscopic removal.  The pros and cons of each treatment was discussed.  She was informed that shockwave lithotripsy is noninvasive and does not require general anesthesia however has a lower success rate.  Her left renal calculi could be treated with both shockwave lithotripsy and ureteroscopy under the same setting.  She would like to think over these options.  KUB was ordered and she will be notified with the results.  Urine culture ordered  Her stone is still present and was visualized in the distal ureter alongside the stent.  She thought over the above options and desires to schedule ureteroscopy.  The indications and nature of the planned procedure were discussed as well as the potential  benefits and expected outcome.  Alternatives have been discussed in detail. The most common complications and side effects were discussed including but not limited to infection/sepsis; blood loss; damage to urethra, bladder, ureter, kidney; need for multiple surgeries; need for prolonged stent placement as well as general anesthesia risks including CVA, MI, DVT, pulmonary and rarely death. Although uncommon she was also informed of the possibility that the calculus may not be able  to be treated due to inability to obtain access to the upper ureter.  All of her questions were answered and she desires to proceed.    Riki AltesScott C , MD  [facility] [address], [address] [place], [place] [address] [phone]

## 2018-12-29 NOTE — Progress Notes (Signed)
12/29/2018 2:25 PM   Mary Chen Apr 01, 1956 [id number]  Referring provider: Crecencio Mc, MD [place],  [place] [address]  Chief Complaint  Patient presents with  . Nephrolithiasis    HPI: 63 y.o. female who presents for hospital follow-up.  She presented to the ED on 12/02/2018 with acute onset of left lower quadrant abdominal pain radiating to the left flank associated with nausea and vomiting and temp to 103.  CT was remarkable for 6 mm left distal ureteral calculus with upstream hydronephrosis.  She also had bilateral, nonobstructing renal calculi.  She underwent urgent ureteral stenting with resolution of her pain and clinical improvement.  Urine and blood cultures positive for E. coli.  She has completed her antibiotics and has no complaints.  She has minimal stent symptoms.  Denies recurrent fever.    PMH: Past Medical History:  Diagnosis Date  . Fibrocystic disease of breast   . Hx pulmonary embolism Nov 2009   postoperative, negaive Factor V Leuiden, neg LE ultrasound.   . Ovarian cyst     Surgical History: Past Surgical History:  Procedure Laterality Date  . ABDOMINAL HYSTERECTOMY    . BREAST BIOPSY Left    neg  . BREAST EXCISIONAL BIOPSY Left    neg  . CYSTOSCOPY WITH STENT PLACEMENT Left 12/03/2018   Procedure: CYSTOSCOPY WITH STENT PLACEMENT;  Surgeon: Abbie Sons, MD;  Location: [facility] ORS;  Service: Urology;  Laterality: Left;  . TOTAL ABDOMINAL HYSTERECTOMY W/ BILATERAL SALPINGOOPHORECTOMY  2009   for bilateral ovarian cysts and fibroid uterus    Home Medications:  Allergies as of 12/29/2018   No Known Allergies     Medication List       Accurate as of December 29, 2018  2:25 PM. If you have any questions, ask your nurse or doctor.        acetaminophen 500 MG tablet Commonly known as: TYLENOL Take 500-1,000 mg by mouth every 6 (six) hours as needed for mild pain, moderate pain or fever.   ergocalciferol 1.25  MG (50000 UT) capsule Commonly known as: Drisdol Take 1 capsule (50,000 Units total) by mouth once a week.       Allergies: No Known Allergies  Family History: Family History  Problem Relation Age of Onset  . Cancer Mother        breast  . Breast cancer Mother 84  . Bipolar disorder Brother        self inficted GSW   . Cancer Father 92       lung Ca mets to liver     Social History:  reports that she has never smoked. She has never used smokeless tobacco. She reports that she does not drink alcohol or use drugs.  ROS: UROLOGY Frequent Urination?: No Hard to postpone urination?: No Burning/pain with urination?: No Get up at night to urinate?: No Leakage of urine?: No Urine stream starts and stops?: No Trouble starting stream?: No Do you have to strain to urinate?: No Blood in urine?: No Urinary tract infection?: No Sexually transmitted disease?: No Injury to kidneys or bladder?: No Painful intercourse?: No Weak stream?: No Currently pregnant?: No Vaginal bleeding?: No Last menstrual period?: Postmenopausal  Gastrointestinal Nausea?: No Vomiting?: No Indigestion/heartburn?: No Diarrhea?: No Constipation?: No  Constitutional Fever: No Night sweats?: No Weight loss?: No Fatigue?: No  Skin Skin rash/lesions?: No Itching?: No  Eyes Blurred vision?: No Double vision?: No  Ears/Nose/Throat Sore throat?: No Sinus problems?:  No  Hematologic/Lymphatic Swollen glands?: No Easy bruising?: No  Cardiovascular Leg swelling?: No Chest pain?: No  Respiratory Cough?: No Shortness of breath?: No  Endocrine Excessive thirst?: No  Musculoskeletal Back pain?: No  Neurological Headaches?: No Dizziness?: No  Psychologic Depression?: No Anxiety?: No  Physical Exam: BP 120/83 (BP Location: Left Arm, Patient Position: Sitting, Cuff Size: Large)   Pulse 97   Ht 5' 6.5" (1.689 m)   Wt 225 lb (102.1 kg)   BMI 35.77 kg/m   Constitutional:  Alert  and oriented, No acute distress. HEENT: [place] AT, moist mucus membranes.  Trachea midline, no masses. Cardiovascular: No clubbing, cyanosis, or edema.  RRR Respiratory: Normal respiratory effort, no increased work of breathing.  Clear GI: Abdomen is soft, nontender, nondistended, no abdominal masses GU: No CVA tenderness Lymph: No cervical or inguinal lymphadenopathy. Skin: No rashes, bruises or suspicious lesions. Neurologic: Grossly intact, no focal deficits, moving all 4 extremities. Psychiatric: Normal mood and affect.   Assessment & Plan:    - Left distal ureteral calculus Status post urgent stenting for sepsis from urinary source.  We discussed treatment options of her ureteral calculus including shockwave lithotripsy and ureteroscopic removal.  The pros and cons of each treatment was discussed.  She was informed that shockwave lithotripsy is noninvasive and does not require general anesthesia however has a lower success rate.  Her left renal calculi could be treated with both shockwave lithotripsy and ureteroscopy under the same setting.  She would like to think over these options.  KUB was ordered and she will be notified with the results.  Urine culture ordered  Her stone is still present and was visualized in the distal ureter alongside the stent.  She thought over the above options and desires to schedule ureteroscopy.  The indications and nature of the planned procedure were discussed as well as the potential  benefits and expected outcome.  Alternatives have been discussed in detail. The most common complications and side effects were discussed including but not limited to infection/sepsis; blood loss; damage to urethra, bladder, ureter, kidney; need for multiple surgeries; need for prolonged stent placement as well as general anesthesia risks including CVA, MI, DVT, pulmonary and rarely death. Although uncommon she was also informed of the possibility that the calculus may not be able  to be treated due to inability to obtain access to the upper ureter.  All of her questions were answered and she desires to proceed.     C , MD  [facility] [address], [address] [place],  [address] [phone]  

## 2019-01-01 LAB — CULTURE, URINE COMPREHENSIVE

## 2019-01-15 ENCOUNTER — Other Ambulatory Visit: Payer: Self-pay

## 2019-01-15 ENCOUNTER — Encounter
Admission: RE | Admit: 2019-01-15 | Discharge: 2019-01-15 | Disposition: A | Discharge status: Home / Self Care | Payer: 59 | Source: Ambulatory Visit | Attending: Urology | Admitting: Urology

## 2019-01-15 HISTORY — DX: Personal history of urinary calculi: Z87.442

## 2019-01-15 HISTORY — DX: Other specified postprocedural states: Z98.890

## 2019-01-15 HISTORY — DX: Other complications of anesthesia, initial encounter: T88.59XA

## 2019-01-15 HISTORY — DX: Nausea with vomiting, unspecified: R11.2

## 2019-01-15 NOTE — Patient Instructions (Addendum)
Your procedure is scheduled on: 01-23-19 TUESDAY Report to Same Day Surgery 2nd floor medical mall [facility](Medical Mall Entrance-take elevator on left to 2nd floor.  Check in with surgery information desk.) To find out your arrival time please call [phone] between 1PM - 3PM on 01-22-19 MONDAY  Remember: Instructions that are not followed completely may result in serious medical risk, up to and including death, or upon the discretion of your surgeon and anesthesiologist your surgery may need to be rescheduled.    _x___ 1. Do not eat food after midnight the night before your procedure. NO GUM OR CANDY AFTER MIDNIGHT. You may drink clear liquids up to 2 hours before you are scheduled to arrive at the hospital for your procedure.  Do not drink clear liquids within 2 hours of your scheduled arrival to the hospital.  Clear liquids include  --Water or Apple juice without pulp  --Clear carbohydrate beverage such as ClearFast or Gatorade  --Black Coffee or Clear Tea (No milk, no creamers, do not add anything to the coffee or Tea   ____Ensure clear carbohydrate drink on the way to the hospital for bariatric patients  ____Ensure clear carbohydrate drink 3 hours before surgery.     __x__ 2. No Alcohol for 24 hours before or after surgery.   __x__3. No Smoking or e-cigarettes for 24 prior to surgery.  Do not use any chewable tobacco products for at least 6 hour prior to surgery   ____  4. Bring all medications with you on the day of surgery if instructed.    __x__ 5. Notify your doctor if there is any change in your medical condition     (cold, fever, infections).    x___6. On the morning of surgery brush your teeth with toothpaste and water.  You may rinse your mouth with mouth wash if you wish.  Do not swallow any toothpaste or mouthwash.   Do not wear jewelry, make-up, hairpins, clips or nail polish.  Do not wear lotions, powders, or perfumes. You may wear deodorant.  Do not shave 48 hours prior  to surgery. Men may shave face and neck.  Do not bring valuables to the hospital.    [facility] is not [facility] for any belongings or valuables.               Contacts, dentures or bridgework may not be worn into surgery.  Leave your suitcase in the car. After surgery it may be brought to your room.  For patients admitted to the hospital, discharge time is determined by your treatment team.  _  Patients discharged the day of surgery will not be allowed to drive home.  You will need someone to drive you home and stay with you the night of your procedure.    Please read over the following fact sheets that you were given:   [facility] Preparing [facility]  ____ Take anti-hypertensive listed below, cardiac, seizure, asthma, anti-reflux and psychiatric medicines. These include:  1.NONE   2.  3.  4.  5.  6.  ____Fleets enema or Magnesium Citrate as directed.   ____ Use CHG Soap or sage wipes as directed on instruction sheet   ____ Use inhalers on the day of surgery and bring to hospital day of surgery  ____ Stop Metformin and Janumet 2 days prior to surgery.    ____ Take 1/2 of usual insulin dose the night before surgery and none on the morning surgery.   ____ Follow recommendations  from Cardiologist, Pulmonologist or PCP regarding stopping Aspirin, Coumadin, Plavix ,Eliquis, Effient, or Pradaxa, and Pletal.  X____Stop Anti-inflammatories such as Advil, Aleve, Ibuprofen, Motrin, Naproxen, Naprosyn, Goodies powders or aspirin products NOW-OK to take Tylenol   ____ Stop supplements until after surgery.     ____ Bring C-Pap to the hospital.   COME FOR YOUR COVID SWAB ON Friday, September 4TH BETWEEN 8 AM-10:30 AM ([place])

## 2019-01-19 ENCOUNTER — Telehealth: Payer: Self-pay | Admitting: Urology

## 2019-01-19 ENCOUNTER — Other Ambulatory Visit: Payer: Self-pay | Admitting: Physician Assistant

## 2019-01-19 ENCOUNTER — Other Ambulatory Visit
Admission: RE | Admit: 2019-01-19 | Discharge: 2019-01-19 | Disposition: A | Discharge status: Home / Self Care | Payer: 59 | Source: Ambulatory Visit | Attending: Urology | Admitting: Urology

## 2019-01-19 ENCOUNTER — Telehealth: Payer: Self-pay | Admitting: Physician Assistant

## 2019-01-19 ENCOUNTER — Other Ambulatory Visit: Payer: Self-pay

## 2019-01-19 DIAGNOSIS — Z01812 Encounter for preprocedural laboratory examination: Secondary | ICD-10-CM | POA: Insufficient documentation

## 2019-01-19 DIAGNOSIS — Z20828 Contact with and (suspected) exposure to other viral communicable diseases: Secondary | ICD-10-CM | POA: Insufficient documentation

## 2019-01-19 LAB — SARS CORONAVIRUS 2 (TAT 6-24 HRS): SARS Coronavirus 2: NEGATIVE

## 2019-01-19 MED ORDER — CIPROFLOXACIN HCL 500 MG PO TABS: 500.0000 mg | ORAL_TABLET | Freq: Two times a day (BID) | ORAL | 0 refills | Status: AC

## 2019-01-19 NOTE — Telephone Encounter (Signed)
Error

## 2019-01-20 ENCOUNTER — Encounter: Payer: Self-pay | Admitting: Anesthesiology

## 2019-01-22 NOTE — Telephone Encounter (Signed)
error 

## 2019-01-23 ENCOUNTER — Telehealth: Payer: Self-pay | Admitting: Urology

## 2019-01-23 ENCOUNTER — Ambulatory Visit: Payer: 59 | Admitting: Anesthesiology

## 2019-01-23 ENCOUNTER — Ambulatory Visit
Admission: RE | Admit: 2019-01-23 | Discharge: 2019-01-23 | Disposition: A | Discharge status: Home / Self Care | Payer: 59 | Attending: Urology | Admitting: Urology

## 2019-01-23 ENCOUNTER — Encounter
Admission: RE | Disposition: A | Discharge status: Home / Self Care | Payer: Self-pay | Source: Home / Self Care | Attending: Urology

## 2019-01-23 ENCOUNTER — Encounter: Payer: Self-pay | Admitting: *Deleted

## 2019-01-23 ENCOUNTER — Other Ambulatory Visit: Payer: Self-pay

## 2019-01-23 DIAGNOSIS — Z86711 Personal history of pulmonary embolism: Secondary | ICD-10-CM | POA: Insufficient documentation

## 2019-01-23 DIAGNOSIS — N201 Calculus of ureter: Secondary | ICD-10-CM | POA: Diagnosis not present

## 2019-01-23 DIAGNOSIS — E119 Type 2 diabetes mellitus without complications: Secondary | ICD-10-CM | POA: Diagnosis not present

## 2019-01-23 DIAGNOSIS — N202 Calculus of kidney with calculus of ureter: Secondary | ICD-10-CM | POA: Insufficient documentation

## 2019-01-23 DIAGNOSIS — E785 Hyperlipidemia, unspecified: Secondary | ICD-10-CM | POA: Diagnosis not present

## 2019-01-23 HISTORY — PX: CYSTOSCOPY/URETEROSCOPY/HOLMIUM LASER/STENT PLACEMENT: SHX6546

## 2019-01-23 HISTORY — PX: STONE EXTRACTION WITH BASKET: SHX5318

## 2019-01-23 SURGERY — CYSTOSCOPY/URETEROSCOPY/HOLMIUM LASER/STENT PLACEMENT
Anesthesia: General | Laterality: Left

## 2019-01-23 MED ORDER — ONDANSETRON HCL 4 MG/2ML IJ SOLN
INTRAMUSCULAR | Status: DC | PRN
  Administered 2019-01-23: 4 mg via INTRAVENOUS

## 2019-01-23 MED ORDER — SODIUM CHLORIDE (PF) 0.9 % IJ SOLN
INTRAMUSCULAR | Status: AC
  Filled 2019-01-23: qty 10

## 2019-01-23 MED ORDER — GLYCOPYRROLATE 0.2 MG/ML IJ SOLN
INTRAMUSCULAR | Status: AC
  Filled 2019-01-23: qty 1

## 2019-01-23 MED ORDER — IOPAMIDOL (ISOVUE-200) INJECTION 41%
INTRAVENOUS | Status: DC | PRN
  Administered 2019-01-23: 20 mL

## 2019-01-23 MED ORDER — FENTANYL CITRATE (PF) 100 MCG/2ML IJ SOLN
INTRAMUSCULAR | Status: DC | PRN
  Administered 2019-01-23 (×5): 25 ug via INTRAVENOUS

## 2019-01-23 MED ORDER — GLYCOPYRROLATE 0.2 MG/ML IJ SOLN
INTRAMUSCULAR | Status: DC | PRN
  Administered 2019-01-23: 0.2 mg via INTRAVENOUS

## 2019-01-23 MED ORDER — PROMETHAZINE HCL 25 MG/ML IJ SOLN
6.2500 mg | Freq: Once | INTRAMUSCULAR | Status: AC
  Administered 2019-01-23: 09:00:00 6.25 mg via INTRAVENOUS

## 2019-01-23 MED ORDER — PROMETHAZINE HCL 25 MG/ML IJ SOLN
INTRAMUSCULAR | Status: AC
  Administered 2019-01-23: 6.25 mg via INTRAVENOUS
  Filled 2019-01-23: qty 1

## 2019-01-23 MED ORDER — PHENYLEPHRINE HCL (PRESSORS) 10 MG/ML IV SOLN
INTRAVENOUS | Status: DC | PRN
  Administered 2019-01-23 (×3): 100 ug via INTRAVENOUS

## 2019-01-23 MED ORDER — LIDOCAINE HCL (PF) 2 % IJ SOLN
INTRAMUSCULAR | Status: AC
  Filled 2019-01-23: qty 10

## 2019-01-23 MED ORDER — MIDAZOLAM HCL 2 MG/2ML IJ SOLN
INTRAMUSCULAR | Status: AC
  Filled 2019-01-23: qty 2

## 2019-01-23 MED ORDER — PROPOFOL 10 MG/ML IV BOLUS
INTRAVENOUS | Status: AC
  Filled 2019-01-23: qty 20

## 2019-01-23 MED ORDER — SODIUM CHLORIDE 0.9 % IV SOLN
1.0000 g | Freq: Once | INTRAVENOUS | Status: AC
  Administered 2019-01-23: 1 g via INTRAVENOUS
  Filled 2019-01-23: qty 1

## 2019-01-23 MED ORDER — DEXAMETHASONE SODIUM PHOSPHATE 10 MG/ML IJ SOLN
INTRAMUSCULAR | Status: AC
  Filled 2019-01-23: qty 1

## 2019-01-23 MED ORDER — PROPOFOL 10 MG/ML IV BOLUS
INTRAVENOUS | Status: DC | PRN
  Administered 2019-01-23: 140 mg via INTRAVENOUS

## 2019-01-23 MED ORDER — LIDOCAINE HCL (CARDIAC) PF 100 MG/5ML IV SOSY
PREFILLED_SYRINGE | INTRAVENOUS | Status: DC | PRN
  Administered 2019-01-23: 60 mg via INTRAVENOUS

## 2019-01-23 MED ORDER — SODIUM CHLORIDE 0.9 % IV SOLN
INTRAVENOUS | Status: DC
  Administered 2019-01-23: 07:00:00 via INTRAVENOUS

## 2019-01-23 MED ORDER — OXYBUTYNIN CHLORIDE 5 MG PO TABS: ORAL_TABLET | ORAL | 0 refills | Status: DC

## 2019-01-23 MED ORDER — DEXAMETHASONE SODIUM PHOSPHATE 10 MG/ML IJ SOLN
INTRAMUSCULAR | Status: DC | PRN
  Administered 2019-01-23: 10 mg via INTRAVENOUS

## 2019-01-23 MED ORDER — MIDAZOLAM HCL 2 MG/2ML IJ SOLN
INTRAMUSCULAR | Status: DC | PRN
  Administered 2019-01-23: 2 mg via INTRAVENOUS

## 2019-01-23 MED ORDER — ONDANSETRON HCL 4 MG/2ML IJ SOLN
INTRAMUSCULAR | Status: AC
  Filled 2019-01-23: qty 2

## 2019-01-23 MED ORDER — FENTANYL CITRATE (PF) 250 MCG/5ML IJ SOLN
INTRAMUSCULAR | Status: AC
  Filled 2019-01-23: qty 5

## 2019-01-23 MED ORDER — FAMOTIDINE 20 MG PO TABS
20.0000 mg | ORAL_TABLET | Freq: Once | ORAL | Status: AC
  Administered 2019-01-23: 07:00:00 20 mg via ORAL

## 2019-01-23 MED ORDER — FAMOTIDINE 20 MG PO TABS
ORAL_TABLET | ORAL | Status: AC
  Administered 2019-01-23: 20 mg via ORAL
  Filled 2019-01-23: qty 1

## 2019-01-23 MED ORDER — ONDANSETRON HCL 4 MG/2ML IJ SOLN
4.0000 mg | Freq: Once | INTRAMUSCULAR | Status: AC | PRN
  Administered 2019-01-23: 4 mg via INTRAVENOUS

## 2019-01-23 MED ORDER — FENTANYL CITRATE (PF) 100 MCG/2ML IJ SOLN: 25.0000 ug | INTRAMUSCULAR | Status: DC | PRN

## 2019-01-23 SURGICAL SUPPLY — 28 items
BAG DRAIN CYSTO-URO LG1000N (MISCELLANEOUS) ×3 IMPLANT
BASKET ZERO TIP 1.9FR (BASKET) ×1 IMPLANT
BRUSH SCRUB EZ 1% IODOPHOR (MISCELLANEOUS) ×3 IMPLANT
CATH URETL 5X70 OPEN END (CATHETERS) ×1 IMPLANT
CNTNR SPEC 2.5X3XGRAD LEK (MISCELLANEOUS) ×2
CONT SPEC 4OZ STER OR WHT (MISCELLANEOUS) ×1
CONTAINER SPEC 2.5X3XGRAD LEK (MISCELLANEOUS) IMPLANT
DRAPE UTILITY 15X26 TOWEL STRL (DRAPES) ×3 IMPLANT
FIBER LASER TRAC TIP (UROLOGICAL SUPPLIES) IMPLANT
GLOVE BIO SURGEON STRL SZ8 (GLOVE) ×3 IMPLANT
GOWN STRL REUS W/ TWL LRG LVL3 (GOWN DISPOSABLE) ×2 IMPLANT
GOWN STRL REUS W/ TWL XL LVL3 (GOWN DISPOSABLE) ×2 IMPLANT
GOWN STRL REUS W/TWL LRG LVL3 (GOWN DISPOSABLE) ×1
GOWN STRL REUS W/TWL XL LVL3 (GOWN DISPOSABLE) ×1
GUIDEWIRE STR DUAL SENSOR (WIRE) ×4 IMPLANT
INFUSOR MANOMETER BAG 3000ML (MISCELLANEOUS) ×3 IMPLANT
INTRODUCER DILATOR DOUBLE (INTRODUCER) IMPLANT
KIT TURNOVER CYSTO (KITS) ×3 IMPLANT
PACK CYSTO AR (MISCELLANEOUS) ×3 IMPLANT
SET CYSTO W/LG BORE CLAMP LF (SET/KITS/TRAYS/PACK) ×3 IMPLANT
SHEATH URETERAL 12FRX35CM (MISCELLANEOUS) ×1 IMPLANT
SOL .9 NS 3000ML IRR  AL (IV SOLUTION) ×1
SOL .9 NS 3000ML IRR UROMATIC (IV SOLUTION) ×2 IMPLANT
STENT URET 6FRX24 CONTOUR (STENTS) ×1 IMPLANT
STENT URET 6FRX26 CONTOUR (STENTS) IMPLANT
SURGILUBE 2OZ TUBE FLIPTOP (MISCELLANEOUS) ×3 IMPLANT
VALVE UROSEAL ADJ ENDO (VALVE) ×1 IMPLANT
WATER STERILE IRR 1000ML POUR (IV SOLUTION) ×3 IMPLANT

## 2019-01-23 NOTE — Anesthesia Preprocedure Evaluation (Addendum)
Anesthesia Evaluation  Patient identified by MRN, date of birth, ID band Patient awake    Reviewed: Allergy & Precautions, NPO status , Patient's Chart, lab work & pertinent test results, reviewed documented beta blocker date and time   History of Anesthesia Complications (+) PONV and history of anesthetic complications  Airway Mallampati: III  TM Distance: >3 FB     Dental  (+) Chipped, Upper Dentures   Pulmonary           Cardiovascular      Neuro/Psych    GI/Hepatic   Endo/Other  diabetes  Renal/GU Renal disease     Musculoskeletal   Abdominal   Peds  Hematology   Anesthesia Other Findings Hx of PE. EKG ok.  Reproductive/Obstetrics                            Anesthesia Physical Anesthesia Plan  ASA: III  Anesthesia Plan: General   Post-op Pain Management:    Induction: Intravenous  PONV Risk Score and Plan:   Airway Management Planned: LMA  Additional Equipment:   Intra-op Plan:   Post-operative Plan:   Informed Consent: I have reviewed the patients History and Physical, chart, labs and discussed the procedure including the risks, benefits and alternatives for the proposed anesthesia with the patient or authorized representative who has indicated his/her understanding and acceptance.       Plan Discussed with: CRNA  Anesthesia Plan Comments:         Anesthesia Quick Evaluation

## 2019-01-23 NOTE — Anesthesia Postprocedure Evaluation (Signed)
Anesthesia Post Note  Patient: Mary Chen  Procedure(s) Performed: CYSTOSCOPY/URETEROSCOPY//STENT EXCHANGE (Left ) STONE EXTRACTION WITH BASKET  Patient location during evaluation: PACU Anesthesia Type: General Level of consciousness: awake and alert Pain management: pain level controlled Vital Signs Assessment: post-procedure vital signs reviewed and stable Respiratory status: spontaneous breathing, nonlabored ventilation, respiratory function stable and patient connected to nasal cannula oxygen Cardiovascular status: blood pressure returned to baseline and stable Postop Assessment: no apparent nausea or vomiting Anesthetic complications: no     Last Vitals:  Vitals:   01/23/19 0924 01/23/19 0929  BP: 115/86 121/74  Pulse: 82 81  Resp: 17 (!) 22  Temp: (!) 36.1 C 36.5 C  SpO2: 97% 100%    Last Pain:  Vitals:   01/23/19 0929  TempSrc: Temporal  PainSc: 0-No pain                 Treazure Nery S

## 2019-01-23 NOTE — Anesthesia Post-op Follow-up Note (Signed)
Anesthesia QCDR form completed.        

## 2019-01-23 NOTE — Interval H&P Note (Signed)
History and Physical Interval Note:  01/23/2019 7:22 AM  Mary Chen  has presented today for surgery, with the diagnosis of left ureteral calculus,nephrolithiasis.  The various methods of treatment have been discussed with the patient and family. After consideration of risks, benefits and other options for treatment, the patient has consented to  Procedure(s): CYSTOSCOPY/URETEROSCOPY/HOLMIUM LASER/STENT EXCHANGE (Left) as a surgical intervention.  The patient's history has been reviewed, patient examined, no change in status, stable for surgery.  I have reviewed the patient's chart and labs.  Questions were answered to the patient's satisfaction.     [place] [facility] [place]

## 2019-01-23 NOTE — Op Note (Signed)
Preoperative diagnosis:  1.  Left distal ureteral calculus 2.  Left nephrolithiasis  Postoperative diagnosis:  Same  Procedure:  1. Cystoscopy 2. Left ureteroscopy and stone removal 3. Left ureteral stent placement (6FR) 24 cm  Surgeon: Nicki Reaper C. Stoioff, M.D.  Anesthesia: General  Complications: None  Intraoperative findings:  1.  Left distal ureteral calculus  EBL: Minimal  Specimens: 1. Calculus for analysis   Indication: Mary Chen is a 63 y.o. year old patient with urolithiasis.  She initially underwent stent placement 12/02/2018 for an obstructing 6 mm left distal ureteral calculus with sepsis.  She presents today for definitive stone management.  CT also showed nonobstructing renal calculi.  After reviewing the management options for treatment, the patient elected to proceed with the above surgical procedure(s). We have discussed the potential benefits and risks of the procedure, side effects of the proposed treatment, the likelihood of the patient achieving the goals of the procedure, and any potential problems that might occur during the procedure or recuperation. Informed consent has been obtained.  Description of procedure:  The patient was taken to the operating room and general anesthesia was induced.  The patient was placed in the dorsal lithotomy position, prepped and draped in the usual sterile fashion, and preoperative antibiotics were administered. A preoperative time-out was performed.   A 21 French cystoscope was lubricated and passed under direct vision.  Panendoscopy was performed and the bladder mucosa showed no erythema, solid or papillary lesions with the exception of inflammatory changes at the left ureteral orifice secondary to her indwelling stent.   Attention was directed to the left ureteral orifice and the stent was grasped with endoscopic forceps and brought out to the urethral meatus.  A 0.038 sensor wire was placed the stent and advanced to  the renal pelvis under fluoroscopic guidance without difficulty.  A 4.5 Fr semirigid ureteroscope was then advanced into the ureter next to the guidewire and the calculus was identified in the distal ureter.  The ureter was dilated and the stone was placed and a 1.9 [place] nitinol basket and removed without difficulty.  The ureteroscope was re-placed in a second sensor wire was advanced to the renal pelvis.  A 12/14 French ureteral access sheath was placed over the wire and advanced into the ureter without difficulty.  The inner stylette and working wire removed.  A digital flexible ureteroscope was then placed at the access sheath and advanced into the renal pelvis.  All calyces were examined and no calculi were identified.  Contrast was instilled through the ureteroscope and all calyces were examined.  An inferior pole calyx however could not be located.  No calculi were noted on any of the examined calyces.  The access sheath was removed under direct vision and no ureteral abnormalities or ureteral calculi are identified.  A 6 French/24 cm Contour ureteral stent was then placed under fluoroscopic guidance over the safety wire.  Good positioning was noted proximally and distally under fluoroscopy.  The bladder was then emptied and the procedure ended.  The patient appeared to tolerate the procedure well and without complications.  After anesthetic reversal the patient was transported to the PACU in stable condition.   [name], MD

## 2019-01-23 NOTE — Transfer of Care (Signed)
Immediate Anesthesia Transfer of Care Note  Patient: Mary Chen  Procedure(s) Performed: CYSTOSCOPY/URETEROSCOPY//STENT EXCHANGE (Left ) STONE EXTRACTION WITH BASKET  Patient Location: PACU  Anesthesia Type:General  Level of Consciousness: drowsy  Airway & Oxygen Therapy: Patient Spontanous Breathing and Patient connected to face mask oxygen  Post-op Assessment: Report given to RN and Post -op Vital signs reviewed and stable  Post vital signs: Reviewed and stable  Last Vitals:  Vitals Value Taken Time  BP 127/93 01/23/19 0830  Temp    Pulse 87 01/23/19 0830  Resp 22 01/23/19 0830  SpO2 97 % 01/23/19 0830  Vitals shown include unvalidated device data.  Last Pain:  Vitals:   01/23/19 0624  TempSrc: Tympanic  PainSc: 5          Complications: No apparent anesthesia complications

## 2019-01-23 NOTE — Anesthesia Procedure Notes (Signed)
Procedure Name: LMA Insertion Date/Time: 01/23/2019 7:38 AM Performed by: Eben Burow, CRNA Pre-anesthesia Checklist: Patient identified, Emergency Drugs available, Suction available and Patient being monitored Patient Re-evaluated:Patient Re-evaluated prior to induction Oxygen Delivery Method: Circle system utilized Preoxygenation: Pre-oxygenation with 100% oxygen Induction Type: IV induction Ventilation: Mask ventilation without difficulty LMA: LMA inserted LMA Size: 4.0 Number of attempts: 1 Placement Confirmation: positive ETCO2 and breath sounds checked- equal and bilateral Tube secured with: Tape Dental Injury: Teeth and Oropharynx as per pre-operative assessment

## 2019-01-23 NOTE — Telephone Encounter (Signed)
App made pt is aware ° ° °Mary Chen °

## 2019-01-23 NOTE — Discharge Instructions (Signed)
AMBULATORY SURGERY  DISCHARGE INSTRUCTIONS   1) The drugs that you were given will stay in your system until tomorrow so for the next 24 hours you should not:  A) Drive an automobile B) Make any legal decisions C) Drink any alcoholic beverage   2) You may resume regular meals tomorrow.  Today it is better to start with liquids and gradually work up to solid foods.  You may eat anything you prefer, but it is better to start with liquids, then soup and crackers, and gradually work up to solid foods.   3) Please notify your doctor immediately if you have any unusual bleeding, trouble breathing, redness and pain at the surgery site, drainage, fever, or pain not relieved by medication.    4) Additional Instructions:        Please contact your physician with any problems or Same Day Surgery at [phone], Monday through Friday 6 am to 4 pm, or [place] at [facility] number at [phone].DISCHARGE INSTRUCTIONS FOR KIDNEY STONE/URETERAL STENT   MEDICATIONS:  1. Resume all your other meds from home.  2.  AZO (over-the-counter) can help with the burning/stinging when you urinate. 3.  Oxybutynin is for stent irritation.  Rx was sent to your pharmacy.  ACTIVITY:  1. May resume regular activities in 24 hours. 2. No driving while on narcotic pain medications  3. Drink plenty of water  4. Continue to walk at home - you can still get blood clots when you are at home, so keep active, but don't over do it.  5. May return to work/school tomorrow or when you feel ready   BATHING:  1. You can shower.   SIGNS/SYMPTOMS TO CALL:  Please call us if you have a fever greater than 101.5, uncontrolled nausea/vomiting, uncontrolled pain, dizziness, unable to urinate, excessively bloody urine, chest pain, shortness of breath, leg swelling, leg pain, or any other concerns or questions.   Urinary frequency, urgency, blood in urine and bladder spasms are normal.  You can reach [place] at  [phone].   FOLLOW-UP:  1. You we will have an appointment in approximately 1 week for stent removal.  If you do not have a time for this appointment at discharge you will be contacted by our office.

## 2019-01-31 ENCOUNTER — Ambulatory Visit (INDEPENDENT_AMBULATORY_CARE_PROVIDER_SITE_OTHER): Payer: 59 | Admitting: Urology

## 2019-01-31 ENCOUNTER — Encounter: Payer: Self-pay | Admitting: Urology

## 2019-01-31 ENCOUNTER — Other Ambulatory Visit: Payer: Self-pay

## 2019-01-31 VITALS — BP 126/82 | HR 80 | Ht 66.0 in | Wt 220.4 lb

## 2019-01-31 DIAGNOSIS — N200 Calculus of kidney: Secondary | ICD-10-CM

## 2019-01-31 LAB — URINALYSIS, COMPLETE
Bilirubin, UA: NEGATIVE
Glucose, UA: NEGATIVE
Ketones, UA: NEGATIVE
Nitrite, UA: NEGATIVE
Specific Gravity, UA: 1.025 (ref 1.005–1.030)
Urobilinogen, Ur: 0.2 mg/dL (ref 0.2–1.0)
pH, UA: 6.5 (ref 5.0–7.5)

## 2019-01-31 LAB — MICROSCOPIC EXAMINATION
Epithelial Cells (non renal): >10 /hpf — AB (ref 0–10)
RBC, Urine: >30 /hpf — AB (ref 0–2)

## 2019-01-31 MED ORDER — LIDOCAINE HCL URETHRAL/MUCOSAL 2 % EX GEL
1.0000 "application " | Freq: Once | CUTANEOUS | Status: AC
  Administered 2019-01-31: 1 via URETHRAL

## 2019-01-31 MED ORDER — CIPROFLOXACIN HCL 500 MG PO TABS
500.0000 mg | ORAL_TABLET | Freq: Once | ORAL | Status: AC
  Administered 2019-01-31: 500 mg via ORAL

## 2019-01-31 NOTE — Progress Notes (Signed)
Indications: Patient is 63 y.o., female who recently underwent ureteroscopic removal of a left distal ureteral calculus.  She had a left nephrolithiasis however a lower pole calculus was either intraparenchymal or within a calyceal diverticulum and could not be identified on ureteral pyeloscopy.  She had no postoperative problems.  The patient is presenting today for stent removal.  Procedure:  Flexible Cystoscopy with stent removal (83729)  Timeout was performed and the correct patient, procedure and participants were identified.    Description:  The patient was prepped and draped in the usual sterile fashion. Flexible cystosopy was performed.  The stent was visualized, grasped, and removed intact without difficulty. The patient tolerated the procedure well.  A single dose of oral antibiotics was given.  Complications:  None  Plan:  -Instructed to call for flank pain post stent removal -She does have bilateral ureteral calculi and have recommended pursuing a metabolic evaluation -Stone analysis pending -Follow-up 4 months with KUB  [name], MD

## 2019-01-31 NOTE — Patient Instructions (Signed)

## 2019-02-01 LAB — CALCULI, WITH PHOTOGRAPH (CLINICAL LAB)
Calcium Oxalate Monohydrate: 100 %
Weight Calculi: 70 mg

## 2019-02-05 ENCOUNTER — Telehealth: Payer: Self-pay | Admitting: *Deleted

## 2019-02-05 NOTE — Telephone Encounter (Addendum)
Patient informed-verbalized understanding  ----- Message from Scott C Stoioff, MD sent at 02/04/2019 11:46 AM EDT ----- Stone analysis was calcium oxalate monohydrate.  Will await metabolic evaluation.  Follow-up as scheduled.  

## 2019-02-05 NOTE — Telephone Encounter (Addendum)
Patient informed-verbalized understanding  ----- Message from Abbie Sons, MD sent at 02/04/2019 11:46 AM EDT ----- Joaquim Lai analysis was calcium oxalate monohydrate.  Will await metabolic evaluation.  Follow-up as scheduled.

## 2019-04-04 ENCOUNTER — Encounter: Payer: Self-pay | Admitting: Urology

## 2019-04-09 ENCOUNTER — Other Ambulatory Visit: Payer: Self-pay | Admitting: Urology

## 2019-04-09 DIAGNOSIS — N200 Calculus of kidney: Secondary | ICD-10-CM

## 2019-04-23 ENCOUNTER — Other Ambulatory Visit: Payer: Self-pay

## 2019-04-23 ENCOUNTER — Ambulatory Visit (INDEPENDENT_AMBULATORY_CARE_PROVIDER_SITE_OTHER): Payer: 59 | Admitting: Internal Medicine

## 2019-04-23 ENCOUNTER — Encounter: Payer: Self-pay | Admitting: Internal Medicine

## 2019-04-23 VITALS — Ht 66.0 in | Wt 220.4 lb

## 2019-04-23 DIAGNOSIS — E559 Vitamin D deficiency, unspecified: Secondary | ICD-10-CM | POA: Diagnosis not present

## 2019-04-23 DIAGNOSIS — E785 Hyperlipidemia, unspecified: Secondary | ICD-10-CM

## 2019-04-23 DIAGNOSIS — E119 Type 2 diabetes mellitus without complications: Secondary | ICD-10-CM

## 2019-04-23 DIAGNOSIS — Z7189 Other specified counseling: Secondary | ICD-10-CM | POA: Diagnosis not present

## 2019-04-23 DIAGNOSIS — N139 Obstructive and reflux uropathy, unspecified: Secondary | ICD-10-CM

## 2019-04-23 NOTE — Progress Notes (Signed)
Virtual Visit via [email or web address]  This visit type was conducted due to national recommendations for restrictions regarding the COVID-19 pandemic (e.g. social distancing).  This format is felt to be most appropriate for this patient at this time.  All issues noted in this document were discussed and addressed.  No physical exam was performed (except for noted visual exam findings with Video Visits).   I connected with@ on 04/23/19 at  4:30 PM EST by a video enabled telemedicine application  and verified that I am speaking with the correct person using two identifiers. Location patient: home Location provider: work or home office Persons participating in the virtual visit: patient, provider  I discussed the limitations, risks, security and privacy concerns of performing an evaluation and management service by telephone and the availability of in person appointments. I also discussed with the patient that there may be a patient responsible charge related to this service. The patient expressed understanding and agreed to proceed.   Reason for visit: 6 month follow up on type 2 DM diet controlled, obesity, and nephrolithiasis    HPI:  Last seen in August after hospital admission for sepsis secondary to E Coli attributed to bilateral nephrolithiasis .  She is S/P STENT REMOVAL VIA CYSTO SEPT 16 .  Underwent stone analysis and currently awaiting results of metabolic analysis of calcium oxalate excretion   Lab Results  Component Value Date   LABMICR See below: 01/31/2019   MICROALBUR 4.1 (H) 04/21/2016   The patient has no signs or symptoms of COVID 19 infection (fever, cough, sore throat  or shortness of breath beyond what is typical for patient).  Patient denies contact with other persons with the above mentioned symptoms or with anyone confirmed to have COVID 19   She feels generally well, is walking outside  several times per week and checking blood sugars once a week .  Working from home.    BS have  been under 130 fasting and < 150 post prandially.  Denies any recent hypoglyemic events.   Following a carbohydrate modified diet 6 days per week. Denies numbness, burning and tingling of extremities. Appetite is good.     ROS: See pertinent positives and negatives per HPI.  Past Medical History:  Diagnosis Date  . Complication of anesthesia   . Fibrocystic disease of breast   . History of kidney stones   . Hx pulmonary embolism Nov 2009   postoperative, negaive Factor V Leuiden, neg LE ultrasound.   . Ovarian cyst   . PONV (postoperative nausea and vomiting)     Past Surgical History:  Procedure Laterality Date  . ABDOMINAL HYSTERECTOMY    . BREAST BIOPSY Left    neg  . BREAST EXCISIONAL BIOPSY Left    neg  . CESAREAN SECTION     x2  . CYSTOSCOPY WITH STENT PLACEMENT Left 12/03/2018   Procedure: CYSTOSCOPY WITH STENT PLACEMENT;  Surgeon: Abbie Sons, MD;  Location: [facility];  Service: Urology;  Laterality: Left;  . CYSTOSCOPY/URETEROSCOPY/HOLMIUM LASER/STENT PLACEMENT Left 01/23/2019   Procedure: CYSTOSCOPY/URETEROSCOPY//STENT EXCHANGE;  Surgeon: Abbie Sons, MD;  Location: [facility];  Service: Urology;  Laterality: Left;  . STONE EXTRACTION WITH BASKET  01/23/2019   Procedure: STONE EXTRACTION WITH BASKET;  Surgeon: Abbie Sons, MD;  Location: [facility];  Service: Urology;;  . TOTAL ABDOMINAL HYSTERECTOMY W/ BILATERAL SALPINGOOPHORECTOMY  2009   for bilateral ovarian cysts and fibroid uterus    Family History  Problem Relation Age of Onset  .  Cancer Mother        breast  . Breast cancer Mother 27  . Bipolar disorder Brother        self inficted GSW   . Cancer Father 66       lung Ca mets to liver    . SOCIAL HX:  reports that she has never smoked. She has never used smokeless tobacco. She reports that she does not drink alcohol or use drugs.   Current Outpatient Medications:  .  ergocalciferol (DRISDOL) 1.25 MG (50000 UT) capsule, Take 1 capsule (50,000  Units total) by mouth once a week., Disp: 12 capsule, Rfl: 3  EXAM:  VITALS per patient if applicable:  GENERAL: alert, oriented, appears well and in no acute distress  HEENT: atraumatic, conjunttiva clear, no obvious abnormalities on inspection of external nose and ears  NECK: normal movements of the head and neck  LUNGS: on inspection no signs of respiratory distress, breathing rate appears normal, no obvious gross SOB, gasping or wheezing  CV: no obvious cyanosis  MS: moves all visible extremities without noticeable abnormality  PSYCH/NEURO: pleasant and cooperative, no obvious depression or anxiety, speech and thought processing grossly intact  ASSESSMENT AND PLAN:  Discussed the following assessment and plan:  Vitamin D deficiency - Plan: VITAMIN D 25 Hydroxy (Vit-D Deficiency, Fractures)  Controlled type 2 diabetes mellitus without complication, without long-term current use of insulin (HCC) - Plan: Microalbumin / creatinine urine ratio, Hemoglobin A1c, Comprehensive metabolic panel  Hyperlipidemia LDL goal <160 - Plan: Lipid panel  Obstructive uropathy  Educated about COVID-19 virus infection  Obstructive uropathy secondary to 6 mm left ureteral stone.  Sp stent placement followed by removal in September Follow up with [facility] every 6 months with serial abdominal films .   Diabetes mellitus type II, controlled, with no complications (HCC) Progression from prediabetes,  Last a1c was 6.5 and is due know I have addressed  BMI and recommended a low glycemic index diet utilizing smaller more frequent meals to increase metabolism.  I have also recommended that patient continue  exercising with a goal of 30 minutes of aerobic exercise a minimum of 5 days per week.   Lab Results  Component Value Date   HGBA1C 6.2 12/20/2018     Educated about COVID-19 virus infection Educated patient on the newly broadened list of signs and symptoms of COVID-19 infection and ways to  avoid the viral infection including washing hands frequently with soap and water,  using hand sanitizer if unable to wash, avoiding touching face,  staying at home and limiting visitors,  and avoiding contact with people coming in and out of home.  Discussed the potential ineffectiveness of hand sanitizer if left in environments > 110 degrees (ie , the car).  Reminded patient to call office with questions/concerns.  The importance of continued social distancing was discussed today . Patient was screened for the development of any unsafe behaviors or habits that may have developed as a result of the social impact of the virus , including alcohol abuse,  Domestic violence, tobacco abuse and overeating.       I discussed the assessment and treatment plan with the patient. The patient was provided an opportunity to ask questions and all were answered. The patient agreed with the plan and demonstrated an understanding of the instructions.   The patient was advised to call back or seek an in-person evaluation if the symptoms worsen or if the condition fails to improve as anticipated.  I  provided  25 minutes of non-face-to-face time during this encounter reviewing patient's current problems and past procedures/imaging studies, providing counseling on the above mentioned problems , and coordination  of care .   Sherlene Shamseresa L Tullo, MD

## 2019-04-24 DIAGNOSIS — Z7189 Other specified counseling: Secondary | ICD-10-CM | POA: Insufficient documentation

## 2019-04-24 NOTE — Assessment & Plan Note (Signed)
Educated patient on the newly broadened list of signs and symptoms of COVID-19 infection and ways to avoid the viral infection including washing hands frequently with soap and water,  using hand sanitizer if unable to wash, avoiding touching face,  staying at home and limiting visitors,  and avoiding contact with people coming in and out of home.  Discussed the potential ineffectiveness of hand sanitizer if left in environments > 110 degrees (ie , the car).  Reminded patient to call office with questions/concerns.  The importance of continued social distancing was discussed today . Patient was screened for the development of any unsafe behaviors or habits that may have developed as a result of the social impact of the virus , including alcohol abuse,  Domestic violence, tobacco abuse and overeating.    

## 2019-04-24 NOTE — Assessment & Plan Note (Signed)
secondary to 6 mm left ureteral stone.  Sp stent placement followed by removal in September Follow up with [facility] every 6 months with serial abdominal films .

## 2019-04-24 NOTE — Assessment & Plan Note (Signed)
Progression from prediabetes,  Last a1c was 6.5 and is due know I have addressed  BMI and recommended a low glycemic index diet utilizing smaller more frequent meals to increase metabolism.  I have also recommended that patient continue  exercising with a goal of 30 minutes of aerobic exercise a minimum of 5 days per week.   Lab Results  Component Value Date   HGBA1C 6.2 12/20/2018

## 2019-05-28 DIAGNOSIS — H5213 Myopia, bilateral: Secondary | ICD-10-CM | POA: Diagnosis not present

## 2019-05-29 ENCOUNTER — Other Ambulatory Visit: Payer: Self-pay | Admitting: Urology

## 2019-05-29 DIAGNOSIS — N200 Calculus of kidney: Secondary | ICD-10-CM

## 2019-06-04 ENCOUNTER — Ambulatory Visit: Payer: 59 | Admitting: Urology

## 2019-06-06 ENCOUNTER — Ambulatory Visit: Payer: 59 | Admitting: Urology

## 2019-06-29 ENCOUNTER — Ambulatory Visit: Payer: 59 | Attending: Internal Medicine

## 2019-07-03 ENCOUNTER — Ambulatory Visit
Admission: RE | Admit: 2019-07-03 | Discharge: 2019-07-03 | Disposition: A | Discharge status: Home / Self Care | Payer: 59 | Source: Ambulatory Visit | Attending: Urology | Admitting: Urology

## 2019-07-03 ENCOUNTER — Other Ambulatory Visit: Payer: Self-pay

## 2019-07-03 ENCOUNTER — Other Ambulatory Visit: Payer: 59

## 2019-07-03 DIAGNOSIS — N200 Calculus of kidney: Secondary | ICD-10-CM

## 2019-07-12 ENCOUNTER — Ambulatory Visit (INDEPENDENT_AMBULATORY_CARE_PROVIDER_SITE_OTHER): Payer: 59 | Admitting: Urology

## 2019-07-12 ENCOUNTER — Encounter: Payer: Self-pay | Admitting: Urology

## 2019-07-12 ENCOUNTER — Other Ambulatory Visit: Payer: Self-pay

## 2019-07-12 VITALS — BP 135/89 | HR 94 | Ht 66.0 in | Wt 220.0 lb

## 2019-07-12 DIAGNOSIS — N200 Calculus of kidney: Secondary | ICD-10-CM | POA: Diagnosis not present

## 2019-07-12 NOTE — Progress Notes (Signed)
07/12/2019 10:19 AM   Mary Chen [birth year] [id number]  Referring provider: Crecencio Mc, MD [address],  [place] [address]  Chief Complaint  Patient presents with  . Follow-up    Urologic history: 1.  Bilateral nephrolithiasis -Ureteroscopic removal left distal calculus 01/2019 -Left renal calculi could not be identified -Stone analysis 100% calcium oxalate monohydrate   HPI: 64 y.o. female presents for follow-up.  Her stent was removed September 2020 and she states she has been doing well.  She has no complaints today.  Denies flank, abdominal or pelvic pain.  No bothersome lower urinary tract symptoms or gross hematuria.  She dropped off her 24-hour urine and had blood drawn last week.  The results are pending.  KUB performed earlier this month was reviewed and there are faint calcifications overlying the lower portions of the renal outline bilaterally which appear stable.   PMH: Past Medical History:  Diagnosis Date  . Complication of anesthesia   . Fibrocystic disease of breast   . History of kidney stones   . Hx pulmonary embolism Nov 2009   postoperative, negaive Factor V Leuiden, neg LE ultrasound.   . Ovarian cyst   . PONV (postoperative nausea and vomiting)     Surgical History: Past Surgical History:  Procedure Laterality Date  . ABDOMINAL HYSTERECTOMY    . BREAST BIOPSY Left    neg  . BREAST EXCISIONAL BIOPSY Left    neg  . CESAREAN SECTION     x2  . CYSTOSCOPY WITH STENT PLACEMENT Left 12/03/2018   Procedure: CYSTOSCOPY WITH STENT PLACEMENT;  Surgeon: Abbie Sons, MD;  Location: [facility];  Service: Urology;  Laterality: Left;  . CYSTOSCOPY/URETEROSCOPY/HOLMIUM LASER/STENT PLACEMENT Left 01/23/2019   Procedure: CYSTOSCOPY/URETEROSCOPY//STENT EXCHANGE;  Surgeon: Abbie Sons, MD;  Location: [facility];  Service: Urology;  Laterality: Left;  . STONE EXTRACTION WITH BASKET  01/23/2019   Procedure: STONE EXTRACTION  WITH BASKET;  Surgeon: Abbie Sons, MD;  Location: [facility];  Service: Urology;;  . TOTAL ABDOMINAL HYSTERECTOMY W/ BILATERAL SALPINGOOPHORECTOMY  2009   for bilateral ovarian cysts and fibroid uterus    Home Medications:  Allergies as of 07/12/2019   No Known Allergies     Medication List       Accurate as of July 12, 2019 10:19 AM. If you have any questions, ask your nurse or doctor.        ergocalciferol 1.25 MG (50000 UT) capsule Commonly known as: Drisdol Take 1 capsule (50,000 Units total) by mouth once a week.       Allergies: No Known Allergies  Family History: Family History  Problem Relation Age of Onset  . Cancer Mother        breast  . Breast cancer Mother 20  . Bipolar disorder Brother        self inficted GSW   . Cancer Father 6       lung Ca mets to liver     Social History:  reports that she has never smoked. She has never used smokeless tobacco. She reports that she does not drink alcohol or use drugs.   Physical Exam: BP 135/89 (BP Location: Left Arm, Patient Position: Sitting, Cuff Size: Large)   Pulse 94   Ht 5\' 6"  (1.676 m)   Wt 220 lb (99.8 kg)   BMI 35.51 kg/m   Constitutional:  Alert and oriented, No acute distress. HEENT: [place] AT, moist mucus membranes.  Trachea midline,  no masses. Cardiovascular: No clubbing, cyanosis, or edema. Respiratory: Normal respiratory effort, no increased work of breathing. Skin: No rashes, bruises or suspicious lesions. Neurologic: Grossly intact, no focal deficits, moving all 4 extremities. Psychiatric: Normal mood and affect.   Pertinent Imaging: Images personally reviewed Results for orders placed during the hospital encounter of 07/03/19  Abdomen 1 view (KUB)   Narrative CLINICAL DATA:  Follow-up kidney stones from 1 year ago. Previous cystoscopy with stent placement. No current symptoms.  EXAM: ABDOMEN - 1 VIEW  COMPARISON:  12/29/2018  FINDINGS: Normal bowel gas pattern. Multiple  small bilateral renal calculi without significant change. No bladder or ureteral calculi are seen. Mild thoracic spine degenerative changes.  IMPRESSION: Stable bilateral nephrolithiasis.   Electronically Signed   By: Beckie Salts M.D.   On: 07/03/2019 20:01     Assessment & Plan:    - Nephrolithiasis Stable.  Will contact her when her 24-hour urine study/blood work is received.  Follow-up 1 year with KUB and she was instructed to call earlier for development of flank pain/renal colic   Riki Altes, MD  [facility] [address] [place], [place] [address] [phone]

## 2019-09-17 ENCOUNTER — Other Ambulatory Visit: Payer: Self-pay | Admitting: Internal Medicine

## 2020-03-04 ENCOUNTER — Ambulatory Visit: Payer: 59 | Attending: Internal Medicine

## 2020-03-04 DIAGNOSIS — Z23 Encounter for immunization: Secondary | ICD-10-CM

## 2020-03-04 NOTE — Progress Notes (Signed)
   Covid-19 Vaccination Clinic  Name:  Mary Chen    MRN: [id number] DOB: [birth year]  03/04/2020  Mary Chen was observed post Covid-19 immunization for 15 minutes without incident. She was provided with Vaccine Information Sheet and instruction to access the V-Safe system.   Mary Chen was instructed to call 911 with any severe reactions post vaccine: Marland Kitchen Difficulty breathing  . Swelling of face and throat  . A fast heartbeat  . A bad rash all over body  . Dizziness and weakness

## 2020-03-05 ENCOUNTER — Other Ambulatory Visit: Payer: Self-pay | Admitting: Internal Medicine

## 2020-03-06 ENCOUNTER — Other Ambulatory Visit: Payer: Self-pay | Admitting: Internal Medicine

## 2020-03-06 DIAGNOSIS — Z1231 Encounter for screening mammogram for malignant neoplasm of breast: Secondary | ICD-10-CM

## 2020-04-22 ENCOUNTER — Ambulatory Visit
Admission: RE | Admit: 2020-04-22 | Discharge: 2020-04-22 | Disposition: A | Discharge status: Home / Self Care | Payer: 59 | Source: Ambulatory Visit | Attending: Internal Medicine | Admitting: Internal Medicine

## 2020-04-22 ENCOUNTER — Other Ambulatory Visit: Payer: Self-pay

## 2020-04-22 DIAGNOSIS — Z1231 Encounter for screening mammogram for malignant neoplasm of breast: Secondary | ICD-10-CM | POA: Insufficient documentation

## 2020-06-16 ENCOUNTER — Telehealth: Payer: Self-pay

## 2020-06-16 NOTE — Telephone Encounter (Signed)
ERROR

## 2020-07-14 ENCOUNTER — Ambulatory Visit: Payer: 59 | Admitting: Urology

## 2020-07-16 ENCOUNTER — Ambulatory Visit
Admission: RE | Admit: 2020-07-16 | Discharge: 2020-07-16 | Disposition: A | Discharge status: Home / Self Care | Payer: Medicare HMO | Attending: Urology | Admitting: Urology

## 2020-07-16 ENCOUNTER — Encounter: Payer: Self-pay | Admitting: Urology

## 2020-07-16 ENCOUNTER — Ambulatory Visit: Payer: Medicare HMO | Admitting: Urology

## 2020-07-16 ENCOUNTER — Ambulatory Visit
Admission: RE | Admit: 2020-07-16 | Discharge: 2020-07-16 | Disposition: A | Discharge status: Home / Self Care | Payer: Medicare HMO | Source: Ambulatory Visit | Attending: Urology | Admitting: Urology

## 2020-07-16 ENCOUNTER — Other Ambulatory Visit: Payer: Self-pay

## 2020-07-16 VITALS — BP 132/84 | Ht 66.0 in | Wt 210.0 lb

## 2020-07-16 DIAGNOSIS — N200 Calculus of kidney: Secondary | ICD-10-CM

## 2020-07-16 DIAGNOSIS — R1031 Right lower quadrant pain: Secondary | ICD-10-CM

## 2020-07-16 DIAGNOSIS — R109 Unspecified abdominal pain: Secondary | ICD-10-CM | POA: Diagnosis not present

## 2020-07-16 NOTE — Progress Notes (Signed)
07/16/2020 10:28 AM   Mary Chen July 13, 1955 [id number]  Referring provider: Sherlene Shams, MD [address] [place],  [place] [address]  Chief Complaint  Patient presents with  . Nephrolithiasis    Urologic history: 1.  Bilateral nephrolithiasis -Ureteroscopic removal left distal calculus 01/2019 -Left renal calculi could not be identified -Stone analysis 100% calcium oxalate monohydrate   HPI: 65 y.o. female presents for annual follow-up.   No problems since last years visit though states when she received a reminder call for her appointment she has noted mild right lower quadrant abdominal discomfort  Denies flank for pelvic pain   Denies dysuria, gross hematuria   PMH: Past Medical History:  Diagnosis Date  . Complication of anesthesia   . Fibrocystic disease of breast   . History of kidney stones   . Hx pulmonary embolism Nov 2009   postoperative, negaive Factor V Leuiden, neg LE ultrasound.   . Ovarian cyst   . PONV (postoperative nausea and vomiting)     Surgical History: Past Surgical History:  Procedure Laterality Date  . ABDOMINAL HYSTERECTOMY    . BREAST BIOPSY Left    neg  . BREAST EXCISIONAL BIOPSY Left    neg  . CESAREAN SECTION     x2  . CYSTOSCOPY WITH STENT PLACEMENT Left 12/03/2018   Procedure: CYSTOSCOPY WITH STENT PLACEMENT;  Surgeon: Riki Altes, MD;  Location: [facility];  Service: Urology;  Laterality: Left;  . CYSTOSCOPY/URETEROSCOPY/HOLMIUM LASER/STENT PLACEMENT Left 01/23/2019   Procedure: CYSTOSCOPY/URETEROSCOPY//STENT EXCHANGE;  Surgeon: Riki Altes, MD;  Location: [facility];  Service: Urology;  Laterality: Left;  . STONE EXTRACTION WITH BASKET  01/23/2019   Procedure: STONE EXTRACTION WITH BASKET;  Surgeon: Riki Altes, MD;  Location: [facility];  Service: Urology;;  . TOTAL ABDOMINAL HYSTERECTOMY W/ BILATERAL SALPINGOOPHORECTOMY  2009   for bilateral ovarian cysts and fibroid uterus    Home  Medications:  Allergies as of 07/16/2020   No Known Allergies     Medication List       Accurate as of July 16, 2020 10:28 AM. If you have any questions, ask your nurse or doctor.        Vitamin D (Ergocalciferol) 1.25 MG (50000 UNIT) Caps capsule Commonly known as: DRISDOL TAKE 1 CAPSULE (50,000 UNITS TOTAL) BY MOUTH ONCE A WEEK.       Allergies: No Known Allergies  Family History: Family History  Problem Relation Age of Onset  . Cancer Mother        breast  . Breast cancer Mother 18  . Bipolar disorder Brother        self inficted GSW   . Cancer Father 76       lung Ca mets to liver     Social History:  reports that she has never smoked. She has never used smokeless tobacco. She reports that she does not drink alcohol and does not use drugs.   Physical Exam: BP 132/84   Ht 5\' 6"  (1.676 m)   Wt 210 lb (95.3 kg)   BMI 33.89 kg/m   Constitutional:  Alert and oriented, No acute distress. HEENT: [place] AT, moist mucus membranes.  Trachea midline, no masses. Cardiovascular: No clubbing, cyanosis, or edema. Respiratory: Normal respiratory effort, no increased work of breathing. Neurologic: Grossly intact, no focal deficits, moving all 4 extremities. Psychiatric: Normal mood and affect.    Assessment & Plan:    1.  Bilateral nephrolithiasis  Recent mild right  lower quadrant discomfort  KUB ordered  UA ordered  Will notify with results  Continue annual follow-up   Riki Altes, MD  [facility] [address], [address] [place], [place] [address] [phone]

## 2020-07-17 ENCOUNTER — Telehealth: Payer: Self-pay | Admitting: Urology

## 2020-07-17 LAB — MICROSCOPIC EXAMINATION: Epithelial Cells (non renal): >10 /hpf — AB (ref 0–10)

## 2020-07-17 LAB — URINALYSIS, COMPLETE
Bilirubin, UA: NEGATIVE
Glucose, UA: NEGATIVE
Ketones, UA: NEGATIVE
Nitrite, UA: NEGATIVE
Protein,UA: NEGATIVE
RBC, UA: NEGATIVE
Specific Gravity, UA: 1.02 (ref 1.005–1.030)
Urobilinogen, Ur: 0.2 mg/dL (ref 0.2–1.0)
pH, UA: 6.5 (ref 5.0–7.5)

## 2020-07-17 NOTE — Telephone Encounter (Signed)
KUB showed stable renal calculi.  It looks like there may be a small stone in the left ureter.  I think she indicated she was hurting on the right side.  Unless she is having increasing pain would recommend a follow-up KUB in 2 weeks.  Urinalysis did not show blood

## 2020-07-21 ENCOUNTER — Other Ambulatory Visit: Payer: Self-pay | Admitting: *Deleted

## 2020-07-21 DIAGNOSIS — N200 Calculus of kidney: Secondary | ICD-10-CM

## 2020-07-21 NOTE — Progress Notes (Signed)
kub

## 2020-07-21 NOTE — Telephone Encounter (Signed)
Notified patient as instructed, patient pleased. Discussed follow-up appointments, patient agrees  

## 2020-07-29 ENCOUNTER — Ambulatory Visit
Admission: RE | Admit: 2020-07-29 | Discharge: 2020-07-29 | Disposition: A | Discharge status: Home / Self Care | Payer: Medicare HMO | Source: Ambulatory Visit | Attending: Urology | Admitting: Urology

## 2020-07-29 ENCOUNTER — Ambulatory Visit
Admission: RE | Admit: 2020-07-29 | Discharge: 2020-07-29 | Disposition: A | Discharge status: Home / Self Care | Payer: Medicare HMO | Attending: Urology | Admitting: Urology

## 2020-07-29 ENCOUNTER — Telehealth: Payer: Self-pay | Admitting: Urology

## 2020-07-29 DIAGNOSIS — N200 Calculus of kidney: Secondary | ICD-10-CM

## 2020-07-29 NOTE — Telephone Encounter (Signed)
The calcification seen on the 3/2 x-ray was not visualized on today's x-ray.  Is she aware of passing a stone and if she still hurting?

## 2020-07-30 NOTE — Telephone Encounter (Signed)
Notified patient as instructed, patient dont think she passes the stone. She is not having any pain .

## 2020-08-01 NOTE — Addendum Note (Signed)
Addended by: Riki Altes on: 08/01/2020 07:27 AM   Modules accepted: Orders

## 2020-08-01 NOTE — Telephone Encounter (Signed)
Recommend scheduling a renal ultrasound to make sure there is no evidence of kidney blockage.  Order entered and will call with results

## 2020-08-01 NOTE — Telephone Encounter (Signed)
Notified patient as instructed, patient pleased. Discussed follow-up appointments, patient agrees  

## 2020-08-25 ENCOUNTER — Ambulatory Visit
Admission: RE | Admit: 2020-08-25 | Discharge: 2020-08-25 | Disposition: A | Discharge status: Home / Self Care | Payer: Medicare HMO | Source: Ambulatory Visit | Attending: Urology | Admitting: Urology

## 2020-08-25 ENCOUNTER — Other Ambulatory Visit: Payer: Self-pay

## 2020-08-25 DIAGNOSIS — N202 Calculus of kidney with calculus of ureter: Secondary | ICD-10-CM | POA: Diagnosis not present

## 2020-08-25 DIAGNOSIS — N200 Calculus of kidney: Secondary | ICD-10-CM | POA: Diagnosis not present

## 2020-08-27 ENCOUNTER — Telehealth: Payer: Self-pay | Admitting: *Deleted

## 2020-08-27 DIAGNOSIS — R69 Illness, unspecified: Secondary | ICD-10-CM | POA: Diagnosis not present

## 2020-08-27 DIAGNOSIS — E669 Obesity, unspecified: Secondary | ICD-10-CM | POA: Diagnosis not present

## 2020-08-27 DIAGNOSIS — Z803 Family history of malignant neoplasm of breast: Secondary | ICD-10-CM | POA: Diagnosis not present

## 2020-08-27 DIAGNOSIS — Z8249 Family history of ischemic heart disease and other diseases of the circulatory system: Secondary | ICD-10-CM | POA: Diagnosis not present

## 2020-08-27 DIAGNOSIS — Z801 Family history of malignant neoplasm of trachea, bronchus and lung: Secondary | ICD-10-CM | POA: Diagnosis not present

## 2020-08-27 DIAGNOSIS — R030 Elevated blood-pressure reading, without diagnosis of hypertension: Secondary | ICD-10-CM | POA: Diagnosis not present

## 2020-08-27 DIAGNOSIS — E559 Vitamin D deficiency, unspecified: Secondary | ICD-10-CM | POA: Diagnosis not present

## 2020-08-27 NOTE — Telephone Encounter (Signed)
Notified patient as instructed, patient pleased °

## 2020-08-27 NOTE — Telephone Encounter (Signed)
-----   Message from Riki Altes, MD sent at 08/26/2020  4:52 PM EDT ----- Renal ultrasound showed no evidence of stones or kidney blockage.

## 2020-12-02 ENCOUNTER — Ambulatory Visit
Admission: EM | Admit: 2020-12-02 | Discharge: 2020-12-02 | Disposition: A | Discharge status: Home / Self Care | Payer: Medicare HMO | Attending: Emergency Medicine | Admitting: Emergency Medicine

## 2020-12-02 ENCOUNTER — Other Ambulatory Visit: Payer: Self-pay

## 2020-12-02 ENCOUNTER — Encounter: Payer: Self-pay | Admitting: Emergency Medicine

## 2020-12-02 DIAGNOSIS — N390 Urinary tract infection, site not specified: Secondary | ICD-10-CM

## 2020-12-02 LAB — POCT URINALYSIS DIP (MANUAL ENTRY)
Glucose, UA: NEGATIVE mg/dL
Nitrite, UA: POSITIVE — AB
Protein Ur, POC: 100 mg/dL — AB
Spec Grav, UA: >=1.03 — AB (ref 1.010–1.025)
Urobilinogen, UA: 4 E.U./dL — AB
pH, UA: 6 (ref 5.0–8.0)

## 2020-12-02 MED ORDER — CEPHALEXIN 500 MG PO CAPS: 500.0000 mg | ORAL_CAPSULE | Freq: Two times a day (BID) | ORAL | 0 refills | Status: AC

## 2020-12-02 NOTE — ED Provider Notes (Signed)
Subjective:    Mary Chen is a very pleasant 65 y.o. female who presents with concerns for UTI due to right flank pain which started on Thursday.  Patient states that she has had 2 episodes of vomiting since her pain began.  Patient states she does have a history of kidney stones, but states that she is not having any flank pain that has continued.  Patient does report frequency of urination, but states that she has increased fluid intake which is help with her discomfort.  Patient also reports feeling chills and clammy at times.  She does not report a fever.  Patient states that she has also not noticed any hematuria.  She does have an appointment to follow-up with urology on 7/21.  Past medical history, past surgical history, current medications reviewed.  Allergies: has No Known Allergies.  Review of Systems See HPI   Objective:     Vitals:   12/02/20 1103  BP: 121/71  Pulse: 79  Resp: 18  Temp: 99.2 F (37.3 C)  SpO2: 95%     General: Appears well-developed and well-nourished. No acute distress.  Cardiovascular: Normal rate  Pulm/Chest: No respiratory distress. Abdominal: No CVAT.  Neurological: Alert and oriented to person, place, and time.  Skin: Skin is warm and dry.  Psychiatric: Normal mood, affect, behavior, and thought content.  GU:  Deferred secondary to self collect specimen  Laboratory:  Orders Placed This Encounter  Procedures   Urine Culture   POCT urinalysis dipstick   Results for orders placed or performed during the hospital encounter of 12/02/20  POCT urinalysis dipstick  Result Value Ref Range   Color, UA yellow yellow   Clarity, UA cloudy (A) clear   Glucose, UA negative negative mg/dL   Bilirubin, UA moderate (A) negative   Ketones, POC UA moderate (40) (A) negative mg/dL   Spec Grav, UA >=8.416 (A) 1.010 - 1.025   Blood, UA small (A) negative   pH, UA 6.0 5.0 - 8.0   Protein Ur, POC =100 (A) negative mg/dL   Urobilinogen, UA 4.0 (A)  0.2 or 1.0 E.U./dL   Nitrite, UA Positive (A) Negative   Leukocytes, UA Small (1+) (A) Negative    -Urinalysis reveals cloudy urine with moderate bilirubin, moderate ketones, increased specific gravity, small blood, 100 protein, 4 urobilinogen, positive nitrite and small leukocytes. Assessment:   1. Acute UTI - Urine Culture; Standing - Urine Culture  Meds ordered this encounter  Medications   cephALEXin (KEFLEX) 500 MG capsule    Sig: Take 1 capsule (500 mg total) by mouth 2 (two) times daily for 7 days.    Dispense:  14 capsule    Refill:  0    Order Specific Question:   Supervising Provider    Answer:   Merrilee Jansky [[id number]]     Plan:   MDM: Patient presents with concerns for UTI due to right flank pain which started on Thursday.  Patient states that she has had 2 episodes of vomiting since her pain began.  Patient states she does have a history of kidney stones, but states that she is not having any flank pain that has continued.  Patient does report frequency of urination, but states that she has increased fluid intake which is help with her discomfort.  Patient also reports feeling chills and clammy at times.  She does not report a fever.  Patient states that she has also not noticed any hematuria.  She does have an appointment  to follow-up with urology on 7/21.  Chart review completed.  Urinalysis reveals cloudy urine with moderate bilirubin, moderate ketones, increased specific gravity, small blood, 100 protein, 4 urobilinogen, positive nitrite and small leukocytes.  Given symptoms along with assessment findings and urinalysis results, likely acute UTI.  Prescribed Keflex to the patient's preferred pharmacy.  The patient is experiencing no CVAT in clinic today and does not have a fever, as such I have lower suspicion for complicated UTI/pyelonephritis today.  Did advise about home treatment and care as outlined in her AVS along with strict emergency department precautions for  worsening of symptoms.  Advised to keep her appointment with urology as scheduled on 7/21 for follow-up.  Patient verbalized understanding and agreed with plan.  Stable on discharge.    Discharge Instructions      Follow-up with urology per your appointment scheduled for 7/21.  You were seen today for an infection in the lower urinary tract. Your urine sample today was sent for a culture. The urine culture will show what type of bacteria grows and if you are on the appropriate antibiotic. If the antibiotic needs to be changed, you will receive a phone call from the follow up nurse who will give you more information. If you do not receive a call, then you are on the correct antibiotic.  Take antibiotics as directed. Finish course even if feeling better sooner. Drink plenty of clear fluids. Over the counter "Uristat" or "Azo Standard" may help your discomfort.  This will turn your urine dark orange or red and may stain contacts (remove before using). You may experience 24 - 48 hours of continuing discomfort until medication controls the infection.  Return to clinic or go to the ER if you develop a fever, one-sided back pain, or vomiting as these are signs of a worsening infection.       Amalia Greenhouse, FNP-C 12/02/20  A copy of these instructions was provided to the patient or responsible parent/guardian, who expressed understanding and agreed with the treatment plan.  All questions addressed.  This note was partially made with the aid of speech-to-text dictation; typographical errors are not intentional.    Amalia Greenhouse, FNP 12/02/20 1128

## 2020-12-02 NOTE — Discharge Instructions (Addendum)
Follow-up with urology per your appointment scheduled for 7/21.  You were seen today for an infection in the lower urinary tract. Your urine sample today was sent for a culture. The urine culture will show what type of bacteria grows and if you are on the appropriate antibiotic. If the antibiotic needs to be changed, you will receive a phone call from the follow up nurse who will give you more information. If you do not receive a call, then you are on the correct antibiotic.  Take antibiotics as directed. Finish course even if feeling better sooner. Drink plenty of clear fluids. Over the counter "Uristat" or "Azo Standard" may help your discomfort.  This will turn your urine dark orange or red and may stain contacts (remove before using). You may experience 24 - 48 hours of continuing discomfort until medication controls the infection.  Return to clinic or go to the ER if you develop a fever, one-sided back pain, or vomiting as these are signs of a worsening infection.

## 2020-12-02 NOTE — ED Triage Notes (Signed)
Pt presents today with c/o of right flank pain that began last night. Denies radiation down to groin. She did have one episode of vomiting approx 4 days ago. She has upcoming appt with Urologist on Thursday, 12/04/20.

## 2020-12-04 ENCOUNTER — Ambulatory Visit: Payer: Medicare HMO | Admitting: Physician Assistant

## 2020-12-04 ENCOUNTER — Other Ambulatory Visit: Payer: Self-pay

## 2020-12-04 VITALS — BP 120/85 | HR 71 | Ht 66.0 in | Wt 200.0 lb

## 2020-12-04 DIAGNOSIS — R8290 Unspecified abnormal findings in urine: Secondary | ICD-10-CM | POA: Diagnosis not present

## 2020-12-04 DIAGNOSIS — R109 Unspecified abdominal pain: Secondary | ICD-10-CM

## 2020-12-04 LAB — URINE CULTURE: Culture: >=100000 — AB

## 2020-12-04 NOTE — Patient Instructions (Signed)
Complete your antibiotics as prescribed.  If you develop any of the following, please contact our office immediately for further evaluation: -Return of your right flank pain -Fever -Chills -Nausea -Vomiting -Seeing blood in your urine

## 2020-12-04 NOTE — Progress Notes (Signed)
12/04/2020 5:06 PM   Mary Chen 1955/10/17 [id number]  CC: Chief Complaint  Patient presents with   Dysuria    HPI: Mary Chen is a 65 y.o. female with PMH nephrolithiasis who presents today for evaluation of possible acute stone episode.   She was seen in the emergency department 2 days ago for evaluation of right flank pain associated with vomiting and frequency.  UA was grossly infected at that time with nitrites and 1+ leukocyte esterase. Culture has resulted with ampicillin and nitrofurantoin resistant Klebsiella pneumoniae.  For unclear reasons, no imaging was obtained despite her history of nephrolithiasis.  She was started on culture appropriate Keflex 500 mg twice daily x7 days.  Today she reports complete resolution of her right flank pain, vomiting, and frequency on Keflex.  Today she states she feels well with no acute concerns.  She underwent renal ultrasound on 08/26/2020, with no evidence of shadowing stones.  However, KUB dated 07/29/2020 noted probable small bilateral nonobstructing renal stones.  In-office UA today positive for trace intact blood, 1+ protein, 4.0 EU/DL urobilinogen, and trace leukocyte esterase; urine microscopy with 11-30 WBCs/HPF.  PMH: Past Medical History:  Diagnosis Date   Complication of anesthesia    Fibrocystic disease of breast    History of kidney stones    Hx pulmonary embolism Nov 2009   postoperative, negaive Factor V Leuiden, neg LE ultrasound.    Ovarian cyst    PONV (postoperative nausea and vomiting)     Surgical History: Past Surgical History:  Procedure Laterality Date   ABDOMINAL HYSTERECTOMY     BREAST BIOPSY Left    neg   BREAST EXCISIONAL BIOPSY Left    neg   CESAREAN SECTION     x2   CYSTOSCOPY WITH STENT PLACEMENT Left 12/03/2018   Procedure: CYSTOSCOPY WITH STENT PLACEMENT;  Surgeon: Riki Altes, MD;  Location: [facility];  Service: Urology;  Laterality: Left;    CYSTOSCOPY/URETEROSCOPY/HOLMIUM LASER/STENT PLACEMENT Left 01/23/2019   Procedure: CYSTOSCOPY/URETEROSCOPY//STENT EXCHANGE;  Surgeon: Riki Altes, MD;  Location: [facility];  Service: Urology;  Laterality: Left;   STONE EXTRACTION WITH BASKET  01/23/2019   Procedure: STONE EXTRACTION WITH BASKET;  Surgeon: Riki Altes, MD;  Location: [facility];  Service: Urology;;   TOTAL ABDOMINAL HYSTERECTOMY W/ BILATERAL SALPINGOOPHORECTOMY  2009   for bilateral ovarian cysts and fibroid uterus    Home Medications:  Allergies as of 12/04/2020   No Known Allergies      Medication List        Accurate as of December 04, 2020  5:06 PM. If you have any questions, ask your nurse or doctor.          cephALEXin 500 MG capsule Commonly known as: KEFLEX Take 1 capsule (500 mg total) by mouth 2 (two) times daily for 7 days.   Pfizer-BioNTech COVID-19 Vacc 30 MCG/0.3ML injection Generic drug: COVID-19 mRNA vaccine (Pfizer) USE AS DIRECTED        Allergies:  No Known Allergies  Family History: Family History  Problem Relation Age of Onset   Cancer Mother        breast   Breast cancer Mother 9   Bipolar disorder Brother        self inficted GSW    Cancer Father 88       lung Ca mets to liver     Social History:   reports that she has never smoked. She has never used smokeless tobacco. She reports  that she does not drink alcohol and does not use drugs.  Physical Exam: BP 120/85   Pulse 71   Ht 5\' 6"  (1.676 m)   Wt 200 lb (90.7 kg)   BMI 32.28 kg/m   Constitutional:  Alert and oriented, no acute distress, nontoxic appearing HEENT: [place], AT Cardiovascular: No clubbing, cyanosis, or edema Respiratory: Normal respiratory effort, no increased work of breathing Skin: No rashes, bruises or suspicious lesions Neurologic: Grossly intact, no focal deficits, moving all 4 extremities Psychiatric: Normal mood and affect  Laboratory Data: Results for orders placed or performed in visit on  12/04/20  Microscopic Examination   Urine  Result Value Ref Range   WBC, UA 11-30 (A) 0 - 5 /hpf   RBC 0-2 0 - 2 /hpf   Epithelial Cells (non renal) 0-10 0 - 10 /hpf   Casts Present (A) None seen /lpf   Cast Type Hyaline casts N/A   Mucus, UA Present (A) Not Estab.   Bacteria, UA Few None seen/Few  Urinalysis, Complete  Result Value Ref Range   Specific Gravity, UA 1.020 1.005 - 1.030   pH, UA 6.0 5.0 - 7.5   Color, UA Yellow Yellow   Appearance Ur Cloudy (A) Clear   Leukocytes,UA Trace (A) Negative   Protein,UA 1+ (A) Negative/Trace   Glucose, UA Negative Negative   Ketones, UA Negative Negative   RBC, UA Trace (A) Negative   Bilirubin, UA Negative Negative   Urobilinogen, Ur 4.0 (H) 0.2 - 1.0 mg/dL   Nitrite, UA Negative Negative   Microscopic Examination See below:    Assessment & Plan:   1. Right flank pain Associated with vomiting and urinary frequency and completely resolved on culture appropriate antibiotics consistent with appropriate treatment for UTI versus early pyelonephritis.  Counseled the patient to complete her course of antibiotics as prescribed and return to clinic with recurrent flank pain, nausea, vomiting, fever, or chills.  If this occurs, recommend stat CT stone study for further evaluation.  Patient expressed understanding. - Urinalysis, Complete   Return if symptoms worsen or fail to improve.  12/06/20, PA-C  [facility] [address] [place], [place] [phone]

## 2020-12-05 LAB — MICROSCOPIC EXAMINATION

## 2020-12-05 LAB — URINALYSIS, COMPLETE
Bilirubin, UA: NEGATIVE
Glucose, UA: NEGATIVE
Ketones, UA: NEGATIVE
Nitrite, UA: NEGATIVE
Specific Gravity, UA: 1.02 (ref 1.005–1.030)
Urobilinogen, Ur: 4 mg/dL — ABNORMAL HIGH (ref 0.2–1.0)
pH, UA: 6 (ref 5.0–7.5)

## 2021-01-15 ENCOUNTER — Other Ambulatory Visit: Payer: Self-pay

## 2021-01-15 ENCOUNTER — Ambulatory Visit (INDEPENDENT_AMBULATORY_CARE_PROVIDER_SITE_OTHER): Payer: Medicare HMO | Admitting: Internal Medicine

## 2021-01-15 ENCOUNTER — Encounter: Payer: Self-pay | Admitting: Internal Medicine

## 2021-01-15 VITALS — BP 124/76 | HR 94 | Temp 97.4°F | Ht 66.0 in | Wt 213.4 lb

## 2021-01-15 DIAGNOSIS — R5383 Other fatigue: Secondary | ICD-10-CM

## 2021-01-15 DIAGNOSIS — Z1211 Encounter for screening for malignant neoplasm of colon: Secondary | ICD-10-CM | POA: Diagnosis not present

## 2021-01-15 DIAGNOSIS — Z23 Encounter for immunization: Secondary | ICD-10-CM | POA: Diagnosis not present

## 2021-01-15 DIAGNOSIS — Z1231 Encounter for screening mammogram for malignant neoplasm of breast: Secondary | ICD-10-CM | POA: Diagnosis not present

## 2021-01-15 DIAGNOSIS — Z0000 Encounter for general adult medical examination without abnormal findings: Secondary | ICD-10-CM | POA: Diagnosis not present

## 2021-01-15 DIAGNOSIS — E1169 Type 2 diabetes mellitus with other specified complication: Secondary | ICD-10-CM

## 2021-01-15 DIAGNOSIS — R69 Illness, unspecified: Secondary | ICD-10-CM | POA: Diagnosis not present

## 2021-01-15 DIAGNOSIS — E119 Type 2 diabetes mellitus without complications: Secondary | ICD-10-CM | POA: Diagnosis not present

## 2021-01-15 DIAGNOSIS — E785 Hyperlipidemia, unspecified: Secondary | ICD-10-CM | POA: Diagnosis not present

## 2021-01-15 DIAGNOSIS — E559 Vitamin D deficiency, unspecified: Secondary | ICD-10-CM | POA: Diagnosis not present

## 2021-01-15 DIAGNOSIS — Z780 Asymptomatic menopausal state: Secondary | ICD-10-CM

## 2021-01-15 DIAGNOSIS — E876 Hypokalemia: Secondary | ICD-10-CM

## 2021-01-15 DIAGNOSIS — Z113 Encounter for screening for infections with a predominantly sexual mode of transmission: Secondary | ICD-10-CM

## 2021-01-15 NOTE — Progress Notes (Signed)
Patient ID: Mary Chen, female    DOB: March 02, 1956  Age: 65 y.o. MRN: [id number]  The patient is here for annual  preventive examination and management of other chronic and acute problems.   The risk factors are reflected in the social history.  The roster of all physicians providing medical care to patient - is listed in the Snapshot section of the chart.  Activities of daily living:  The patient is 100% independent in all ADLs: dressing, toileting, feeding as well as independent mobility  Home safety : The patient has smoke detectors in the home. They wear seatbelts.  There are no firearms at home. There is no violence in the home.   There is no risks for hepatitis, STDs or HIV. There is no   history of blood transfusion. They have no travel history to infectious disease endemic areas of the world.  The patient has seen their dentist in the last six month. They have seen their eye doctor in the last year. She denies  hearing difficulty with regard to whispered voices and some television programs.  They have deferred audiologic testing in the last year.  They do not  have excessive sun exposure. Discussed the need for sun protection: hats, long sleeves and use of sunscreen if there is significant sun exposure.   Diet: the importance of a healthy diet is discussed. They do have a healthy diet.  The benefits of regular aerobic exercise were discussed. She walks 4 times per week ,  20 minutes.   Depression screen: there are no signs or vegative symptoms of depression- irritability, change in appetite, anhedonia, sadness/tearfullness.  Cognitive assessment: the patient manages all their financial and personal affairs and is actively engaged. They could relate day,date,year and events; recalled 2/3 objects at 3 minutes; performed clock-face test normally.  The following portions of the patient's history were reviewed and updated as appropriate: allergies, current medications, past family  history, past medical history,  past surgical history, past social history  and problem list.  Visual acuity was not assessed per patient preference since she has regular follow up with her ophthalmologist. Hearing and body mass index were assessed and reviewed.   During the course of the visit the patient was educated and counseled about appropriate screening and preventive services including : fall prevention , diabetes screening, nutrition counseling, colorectal cancer screening, and recommended immunizations.    CC: The primary encounter diagnosis was Well woman exam (no gynecological exam). Diagnoses of Colon cancer screening, Controlled type 2 diabetes mellitus without complication, without long-term current use of insulin (HCC), Vitamin D deficiency, Hypokalemia, Hyperlipidemia LDL goal <160, Screen for STD (sexually transmitted disease), Postmenopausal estrogen deficiency, Fatigue, unspecified type, Breast cancer screening by mammogram, Need for pneumococcal vaccination, Need for immunization against influenza, and Dyslipidemia associated with type 2 diabetes mellitus (HCC) were also pertinent to this visit.  History Mary Chen has a past medical history of Complication of anesthesia, Fibrocystic disease of breast, History of kidney stones, pulmonary embolism (Nov 2009), Ovarian cyst, and PONV (postoperative nausea and vomiting).   She has a past surgical history that includes Total abdominal hysterectomy w/ bilateral salpingoophorectomy (2009); Abdominal hysterectomy; Breast biopsy (Left); Breast excisional biopsy (Left); Cystoscopy with stent placement (Left, 12/03/2018); Cesarean section; Cystoscopy/ureteroscopy/holmium laser/stent placement (Left, 01/23/2019); and Stone extraction with basket (01/23/2019).   Her family history includes Bipolar disorder in her brother; Breast cancer (age of onset: 57) in her mother; Cancer in her mother; Cancer (age of onset: 8) in her  father.She reports that she  has never smoked. She has never used smokeless tobacco. She reports that she does not drink alcohol and does not use drugs.  Outpatient Medications Prior to Visit  Medication Sig Dispense Refill   COVID-19 mRNA vaccine, Pfizer, 30 MCG/0.3ML injection USE AS DIRECTED .3 mL 0   Vitamin D, Ergocalciferol, (DRISDOL) 1.25 MG (50000 UNIT) CAPS capsule Take 50,000 Units by mouth every 7 (seven) days.     No facility-administered medications prior to visit.    Review of Systems  Patient denies headache, fevers, malaise, unintentional weight loss, skin rash, eye pain, sinus congestion and sinus pain, sore throat, dysphagia,  hemoptysis , cough, dyspnea, wheezing, chest pain, palpitations, orthopnea, edema, abdominal pain, nausea, melena, diarrhea, constipation, flank pain, dysuria, hematuria, urinary  Frequency, nocturia, numbness, tingling, seizures,  Focal weakness, Loss of consciousness,  Tremor, insomnia, depression, anxiety, and suicidal ideation.     Objective:  BP 124/76   Pulse 94   Temp (!) 97.4 F (36.3 C) (Skin)   Ht 5\' 6"  (1.676 m)   Wt 213 lb 6.4 oz (96.8 kg)   SpO2 97%   BMI 34.44 kg/m   Physical Exam  General appearance: alert, cooperative and appears stated age Head: Normocephalic, without obvious abnormality, atraumatic Eyes: conjunctivae/corneas clear. PERRL, EOM's intact. Fundi benign. Ears: normal TM's and external ear canals both ears Nose: Nares normal. Septum midline. Mucosa normal. No drainage or sinus tenderness. Throat: lips, mucosa, and tongue normal; teeth and gums normal Neck: no adenopathy, no carotid bruit, no JVD, supple, symmetrical, trachea midline and thyroid not enlarged, symmetric, no tenderness/mass/nodules Lungs: clear to auscultation bilaterally Breasts: normal appearance, no masses or tenderness Heart: regular rate and rhythm, S1, S2 normal, no murmur, click, rub or gallop Abdomen: soft, non-tender; bowel sounds normal; no masses,  no  organomegaly Extremities: extremities normal, atraumatic, no cyanosis or edema Pulses: 2+ and symmetric Skin: Skin color, texture, turgor normal. No rashes or lesions Neurologic: Alert and oriented X 3, normal strength and tone. Normal symmetric reflexes. Normal coordination and gait.     Assessment & Plan:   Problem List Items Addressed This Visit       Unprioritized   Dyslipidemia associated with type 2 diabetes mellitus (HCC)    Her diabetes remains well-controlled on diet alone .  hemoglobin A1c has been consistently at or  less than 7.0 . Patient is up-to-date on eye exams and foot exam is normal today. Patient has no microalbuminuria. Patient is tolerating statin therapy for CAD risk reduction and on ACE/ARB for renal protection and hypertension .  Will offer Ozempic to achieve weight goals.   Lab Results  Component Value Date   HGBA1C 6.3 01/16/2021   Lab Results  Component Value Date   LABMICR See below: 12/04/2020   LABMICR See below: 07/16/2020   MICROALBUR 2.7 (H) 01/16/2021   MICROALBUR 4.1 (H) 04/21/2016           Hyperlipidemia LDL goal <160   Relevant Orders   Lipid Panel w/reflex Direct LDL (Completed)   Well woman exam (no gynecological exam) - Primary    age appropriate education and counseling updated, referrals for preventative services and immunizations addressed, dietary and smoking counseling addressed, most recent labs reviewed.  I have personally reviewed and have noted:   1) the patient's medical and social history 2) The pt's use of alcohol, tobacco, and illicit drugs 3) The patient's current medications and supplements 4) Functional ability including ADL's, fall risk, home safety  risk, hearing and visual impairment 5) Diet and physical activities 6) Evidence for depression or mood disorder 7) The patient's height, weight, and BMI have been recorded in the chart   I have made referrals, and provided counseling and education based on review of  the above      Vitamin D deficiency   Relevant Orders   VITAMIN D 25 Hydroxy (Vit-D Deficiency, Fractures) (Completed)   Hypokalemia   Other Visit Diagnoses     Colon cancer screening       Relevant Orders   Cologuard   Screen for STD (sexually transmitted disease)       Relevant Orders   HIV Antibody (routine testing w rflx)   Hepatitis C antibody   Postmenopausal estrogen deficiency       Relevant Orders   DG Bone Density   Fatigue, unspecified type       Relevant Orders   CBC with Differential/Platelet (Completed)   TSH (Completed)   Breast cancer screening by mammogram       Relevant Orders   MM 3D SCREEN BREAST BILATERAL   Need for pneumococcal vaccination       Relevant Orders   Pneumococcal conjugate vaccine 20-valent (Prevnar 20) (Completed)   Need for immunization against influenza       Relevant Orders   Flu Vaccine QUAD High Dose(Fluad) (Completed)       I am having Mary Chen maintain her COVID-19 mRNA vaccine Proofreader) and Vitamin D (Ergocalciferol).  No orders of the defined types were placed in this encounter.   There are no discontinued medications.  Follow-up: No follow-ups on file.   Sherlene Shams, MD

## 2021-01-15 NOTE — Patient Instructions (Signed)
Please schedule a fasting lab appt at your leisure  If you have not had your diabetic eye exam in the last year,  please schedlue ASAP  Your annual mammogram has been ordered and is due in December.  You are encouraged (required) to call to make your appointment at [place]  [phone]    Your bone density test has been ordered   I will initiate the order for your   colon  cancer screening  Test.  It is called  Cologuard.  It will be delivered to your house, and you will send off a stool sample in the envelope it provides.

## 2021-01-15 NOTE — Progress Notes (Signed)
Pre visit review using our clinic review tool, if applicable. No additional management support is needed unless otherwise documented below in the visit note. 

## 2021-01-16 ENCOUNTER — Other Ambulatory Visit (INDEPENDENT_AMBULATORY_CARE_PROVIDER_SITE_OTHER): Payer: Medicare HMO

## 2021-01-16 ENCOUNTER — Other Ambulatory Visit: Payer: Self-pay

## 2021-01-16 DIAGNOSIS — E559 Vitamin D deficiency, unspecified: Secondary | ICD-10-CM | POA: Diagnosis not present

## 2021-01-16 DIAGNOSIS — E785 Hyperlipidemia, unspecified: Secondary | ICD-10-CM

## 2021-01-16 DIAGNOSIS — Z113 Encounter for screening for infections with a predominantly sexual mode of transmission: Secondary | ICD-10-CM | POA: Diagnosis not present

## 2021-01-16 DIAGNOSIS — R5383 Other fatigue: Secondary | ICD-10-CM | POA: Diagnosis not present

## 2021-01-16 DIAGNOSIS — R69 Illness, unspecified: Secondary | ICD-10-CM | POA: Diagnosis not present

## 2021-01-16 DIAGNOSIS — E119 Type 2 diabetes mellitus without complications: Secondary | ICD-10-CM | POA: Diagnosis not present

## 2021-01-16 LAB — COMPREHENSIVE METABOLIC PANEL
ALT: 10 U/L (ref 0–35)
AST: 11 U/L (ref 0–37)
Albumin: 4 g/dL (ref 3.5–5.2)
Alkaline Phosphatase: 69 U/L (ref 39–117)
BUN: 16 mg/dL (ref 6–23)
CO2: 28 mEq/L (ref 19–32)
Calcium: 9.3 mg/dL (ref 8.4–10.5)
Chloride: 104 mEq/L (ref 96–112)
Creatinine, Ser: 0.77 mg/dL (ref 0.40–1.20)
GFR: 80.71 mL/min (ref >60.00)
Glucose, Bld: 87 mg/dL (ref 70–99)
Potassium: 4.2 mEq/L (ref 3.5–5.1)
Sodium: 141 mEq/L (ref 135–145)
Total Bilirubin: 0.8 mg/dL (ref 0.2–1.2)
Total Protein: 7.2 g/dL (ref 6.0–8.3)

## 2021-01-16 LAB — CBC WITH DIFFERENTIAL/PLATELET
Basophils Absolute: 0 10*3/uL (ref 0.0–0.1)
Basophils Relative: 0.5 % (ref 0.0–3.0)
Eosinophils Absolute: 0.1 10*3/uL (ref 0.0–0.7)
Eosinophils Relative: 1.3 % (ref 0.0–5.0)
HCT: 41.1 % (ref 36.0–46.0)
Hemoglobin: 12.6 g/dL (ref 12.0–15.0)
Lymphocytes Relative: 37.1 % (ref 12.0–46.0)
Lymphs Abs: 1.8 10*3/uL (ref 0.7–4.0)
MCHC: 30.6 g/dL (ref 30.0–36.0)
MCV: 70.7 fl — ABNORMAL LOW (ref 78.0–100.0)
Monocytes Absolute: 0.5 10*3/uL (ref 0.1–1.0)
Monocytes Relative: 10.1 % (ref 3.0–12.0)
Neutro Abs: 2.4 10*3/uL (ref 1.4–7.7)
Neutrophils Relative %: 51 % (ref 43.0–77.0)
Platelets: 258 10*3/uL (ref 150.0–400.0)
RBC: 5.81 Mil/uL — ABNORMAL HIGH (ref 3.87–5.11)
RDW: 17.5 % — ABNORMAL HIGH (ref 11.5–15.5)
WBC: 4.8 10*3/uL (ref 4.0–10.5)

## 2021-01-16 LAB — HEMOGLOBIN A1C: Hgb A1c MFr Bld: 6.3 % (ref 4.6–6.5)

## 2021-01-16 LAB — MICROALBUMIN / CREATININE URINE RATIO
Creatinine,U: 127.8 mg/dL
Microalb Creat Ratio: 2.1 mg/g (ref 0.0–30.0)
Microalb, Ur: 2.7 mg/dL — ABNORMAL HIGH (ref 0.0–1.9)

## 2021-01-16 LAB — VITAMIN D 25 HYDROXY (VIT D DEFICIENCY, FRACTURES): VITD: 23.65 ng/mL — ABNORMAL LOW (ref 30.00–100.00)

## 2021-01-16 LAB — TSH: TSH: 1.59 u[IU]/mL (ref 0.35–5.50)

## 2021-01-17 NOTE — Assessment & Plan Note (Signed)

## 2021-01-18 NOTE — Assessment & Plan Note (Addendum)
Her diabetes remains well-controlled on diet alone .  hemoglobin A1c has been consistently at or  less than 7.0 . Patient is up-to-date on eye exams and foot exam is normal today. Patient has no microalbuminuria. Patient is tolerating statin therapy for CAD risk reduction and on ACE/ARB for renal protection and hypertension .  Will offer Ozempic to achieve weight goals.   Lab Results  Component Value Date   HGBA1C 6.3 01/16/2021   Lab Results  Component Value Date   LABMICR See below: 12/04/2020   LABMICR See below: 07/16/2020   MICROALBUR 2.7 (H) 01/16/2021   MICROALBUR 4.1 (H) 04/21/2016

## 2021-01-20 LAB — LIPID PANEL W/REFLEX DIRECT LDL
Cholesterol: 233 mg/dL — ABNORMAL HIGH (ref <200)
HDL: 59 mg/dL (ref >=50)
LDL Cholesterol (Calc): 149 mg/dL (calc) — ABNORMAL HIGH
Non-HDL Cholesterol (Calc): 174 mg/dL (calc) — ABNORMAL HIGH (ref <130)
Total CHOL/HDL Ratio: 3.9 (calc) (ref <5.0)
Triglycerides: 125 mg/dL (ref <150)

## 2021-01-20 LAB — HEPATITIS C ANTIBODY
Hepatitis C Ab: NONREACTIVE
SIGNAL TO CUT-OFF: 0.02 (ref <1.00)

## 2021-01-20 LAB — HIV ANTIBODY (ROUTINE TESTING W REFLEX): HIV 1&2 Ab, 4th Generation: NONREACTIVE

## 2021-01-20 MED ORDER — TELMISARTAN 20 MG PO TABS: 20.0000 mg | ORAL_TABLET | Freq: Every day | ORAL | 0 refills | Status: DC

## 2021-01-20 NOTE — Progress Notes (Signed)
Telmisartan sent instead of losartan b c of coverage issues

## 2021-01-20 NOTE — Addendum Note (Signed)
Addended by: Sherlene Shams on: 01/20/2021 12:30 PM   Modules accepted: Orders

## 2021-01-20 NOTE — Assessment & Plan Note (Signed)
Adding low dose telmisartan for new onset microalbuminuria

## 2021-02-06 ENCOUNTER — Encounter: Payer: Self-pay | Admitting: Internal Medicine

## 2021-02-06 ENCOUNTER — Ambulatory Visit (INDEPENDENT_AMBULATORY_CARE_PROVIDER_SITE_OTHER): Payer: Medicare HMO | Admitting: Internal Medicine

## 2021-02-06 ENCOUNTER — Other Ambulatory Visit: Payer: Self-pay

## 2021-02-06 VITALS — BP 114/70 | HR 71 | Temp 98.2°F | Ht 66.0 in | Wt 211.4 lb

## 2021-02-06 DIAGNOSIS — K760 Fatty (change of) liver, not elsewhere classified: Secondary | ICD-10-CM

## 2021-02-06 DIAGNOSIS — E785 Hyperlipidemia, unspecified: Secondary | ICD-10-CM

## 2021-02-06 DIAGNOSIS — I10 Essential (primary) hypertension: Secondary | ICD-10-CM | POA: Diagnosis not present

## 2021-02-06 DIAGNOSIS — E1169 Type 2 diabetes mellitus with other specified complication: Secondary | ICD-10-CM

## 2021-02-06 LAB — COMPREHENSIVE METABOLIC PANEL
ALT: 8 U/L (ref 0–35)
AST: 11 U/L (ref 0–37)
Albumin: 4.1 g/dL (ref 3.5–5.2)
Alkaline Phosphatase: 70 U/L (ref 39–117)
BUN: 19 mg/dL (ref 6–23)
CO2: 28 mEq/L (ref 19–32)
Calcium: 9.3 mg/dL (ref 8.4–10.5)
Chloride: 104 mEq/L (ref 96–112)
Creatinine, Ser: 0.78 mg/dL (ref 0.40–1.20)
GFR: 79.44 mL/min (ref >60.00)
Glucose, Bld: 78 mg/dL (ref 70–99)
Potassium: 4.3 mEq/L (ref 3.5–5.1)
Sodium: 140 mEq/L (ref 135–145)
Total Bilirubin: 1.4 mg/dL — ABNORMAL HIGH (ref 0.2–1.2)
Total Protein: 6.9 g/dL (ref 6.0–8.3)

## 2021-02-06 MED ORDER — TELMISARTAN 20 MG PO TABS
20.0000 mg | ORAL_TABLET | Freq: Every day | ORAL | 1 refills | Status: DC
Start: 2021-02-06 — End: 2021-09-22

## 2021-02-06 MED ORDER — ROSUVASTATIN CALCIUM 10 MG PO TABS: ORAL_TABLET | ORAL | 2 refills | Status: DC

## 2021-02-06 NOTE — Assessment & Plan Note (Signed)
Suggested by April 2022 renal ultrasound.  Statin therapy recommended along with lifestyle changes

## 2021-02-06 NOTE — Progress Notes (Signed)
Subjective:  Patient ID: Mary Chen, female    DOB: May 18, 1955  Age: 65 y.o. MRN: [id number]  CC: The primary encounter diagnosis was Hypertension, unspecified type. Diagnoses of Hepatic steatosis and Dyslipidemia associated with type 2 diabetes mellitus (HCC) were also pertinent to this visit.  HPI Mary Chen presents for follow up on type 2 DM, obesity and hypertension  with recent start of telmisartan for microalbuminuria.    Chief Complaint  Patient presents with   Follow-up   Feeling great.  Enjoying retirement.  Staying active with yardwork, arts and crafts, etc.   Tolerating telmisartan.  Had loose stools for several days after starting it  , but this has resolved.  No BM in the last 2-3 days.  Diet reviewed: she had been eating a lot of fruit (grapes) and thinks that may have been the cause .  Tolerating the medication without hypotension .  Fatty liver: noted on renal u/s done in April .  Reviewed all  hepatic panels and available imaging studies for evidence of atherosclerosis. Discussed guidelines for type 2 DM and that she now had 2 indications for statin use. Risks and benefits of statin use discussed.  She is willing to start a 2/week trial and return in 4 weeks for LFTs     Outpatient Medications Prior to Visit  Medication Sig Dispense Refill   Vitamin D, Ergocalciferol, (DRISDOL) 1.25 MG (50000 UNIT) CAPS capsule Take 50,000 Units by mouth every 7 (seven) days.     telmisartan (MICARDIS) 20 MG tablet Take 1 tablet (20 mg total) by mouth at bedtime. 30 tablet 0   COVID-19 mRNA vaccine, Pfizer, 30 MCG/0.3ML injection USE AS DIRECTED .3 mL 0   No facility-administered medications prior to visit.    Review of Systems;  Patient denies headache, fevers, malaise, unintentional weight loss, skin rash, eye pain, sinus congestion and sinus pain, sore throat, dysphagia,  hemoptysis , cough, dyspnea, wheezing, chest pain, palpitations, orthopnea, edema, abdominal  pain, nausea, melena, diarrhea, constipation, flank pain, dysuria, hematuria, urinary  Frequency, nocturia, numbness, tingling, seizures,  Focal weakness, Loss of consciousness,  Tremor, insomnia, depression, anxiety, and suicidal ideation.      Objective:  BP 114/70 (BP Location: Left Arm, Patient Position: Sitting, Cuff Size: Large)   Pulse 71   Temp 98.2 F (36.8 C) (Oral)   Ht 5\' 6"  (1.676 m)   Wt 211 lb 6.4 oz (95.9 kg)   SpO2 96%   BMI 34.12 kg/m   BP Readings from Last 3 Encounters:  02/06/21 114/70  01/15/21 124/76  12/04/20 120/85    Wt Readings from Last 3 Encounters:  02/06/21 211 lb 6.4 oz (95.9 kg)  01/15/21 213 lb 6.4 oz (96.8 kg)  12/04/20 200 lb (90.7 kg)    General appearance: alert, cooperative and appears stated age Ears: normal TM's and external ear canals both ears Throat: lips, mucosa, and tongue normal; teeth and gums normal Neck: no adenopathy, no carotid bruit, supple, symmetrical, trachea midline and thyroid not enlarged, symmetric, no tenderness/mass/nodules Back: symmetric, no curvature. ROM normal. No CVA tenderness. Lungs: clear to auscultation bilaterally Heart: regular rate and rhythm, S1, S2 normal, no murmur, click, rub or gallop Abdomen: soft, non-tender; bowel sounds normal; no masses,  no organomegaly Pulses: 2+ and symmetric Skin: Skin color, texture, turgor normal. No rashes or lesions Lymph nodes: Cervical, supraclavicular, and axillary nodes normal.  Lab Results  Component Value Date   HGBA1C 6.3 01/16/2021   HGBA1C 6.2 12/20/2018  HGBA1C 6.5 10/11/2018    Lab Results  Component Value Date   CREATININE 0.77 01/16/2021   CREATININE 0.68 12/20/2018   CREATININE 0.68 12/20/2018    Lab Results  Component Value Date   WBC 4.8 01/16/2021   HGB 12.6 01/16/2021   HCT 41.1 01/16/2021   PLT 258.0 01/16/2021   GLUCOSE 87 01/16/2021   CHOL 233 (H) 01/16/2021   TRIG 125 01/16/2021   HDL 59 01/16/2021   LDLDIRECT 167.0  04/21/2016   LDLCALC 149 (H) 01/16/2021   ALT 10 01/16/2021   AST 11 01/16/2021   NA 141 01/16/2021   K 4.2 01/16/2021   CL 104 01/16/2021   CREATININE 0.77 01/16/2021   BUN 16 01/16/2021   CO2 28 01/16/2021   TSH 1.59 01/16/2021   HGBA1C 6.3 01/16/2021   MICROALBUR 2.7 (H) 01/16/2021    No results found.  Assessment & Plan:   Problem List Items Addressed This Visit       Unprioritized   Dyslipidemia associated with type 2 diabetes mellitus (HCC)    Her diabetes remains well-controlled on diet alone .  hemoglobin A1c has been consistently at or  less than 7.0 . Patient is up-to-date on eye exams and foot exam was normal last month. . Patient has early microalbuminuria. Patient is now willing to try  statin therapy for CAD risk reduction , management of fatty liver and has been started on  ACE/ARB for renal protection and hypertension   Lab Results  Component Value Date   HGBA1C 6.3 01/16/2021   Lab Results  Component Value Date   LABMICR See below: 12/04/2020   LABMICR See below: 07/16/2020   MICROALBUR 2.7 (H) 01/16/2021   MICROALBUR 4.1 (H) 04/21/2016           Relevant Medications   telmisartan (MICARDIS) 20 MG tablet   rosuvastatin (CRESTOR) 10 MG tablet   Hepatic steatosis    Suggested by April 2022 renal ultrasound.  Statin therapy recommended along with lifestyle changes       Other Visit Diagnoses     Hypertension, unspecified type    -  Primary   Relevant Medications   telmisartan (MICARDIS) 20 MG tablet   rosuvastatin (CRESTOR) 10 MG tablet   Other Relevant Orders   Comprehensive metabolic panel   Comprehensive metabolic panel       I have discontinued Mary Chen's COVID-19 mRNA vaccine [employer]). I am also having her start on rosuvastatin. Additionally, I am having her maintain her Vitamin D (Ergocalciferol) and telmisartan.  Meds ordered this encounter  Medications   telmisartan (MICARDIS) 20 MG tablet    Sig: Take 1 tablet (20  mg total) by mouth at bedtime.    Dispense:  90 tablet    Refill:  1    KEEP ON FILE FOR FUTURE REFILLS   rosuvastatin (CRESTOR) 10 MG tablet    Sig: One tablet twice weekly    Dispense:  25 tablet    Refill:  2    Medications Discontinued During This Encounter  Medication Reason   COVID-19 mRNA vaccine, Pfizer, 30 MCG/0.3ML injection One time medication   telmisartan (MICARDIS) 20 MG tablet Reorder    Follow-up: No follow-ups on file.   Sherlene Shams, MD

## 2021-02-06 NOTE — Assessment & Plan Note (Signed)
Her diabetes remains well-controlled on diet alone .  hemoglobin A1c has been consistently at or  less than 7.0 . Patient is up-to-date on eye exams and foot exam was normal last month. . Patient has early microalbuminuria. Patient is now willing to try  statin therapy for CAD risk reduction , management of fatty liver and has been started on  ACE/ARB for renal protection and hypertension   Lab Results  Component Value Date   HGBA1C 6.3 01/16/2021   Lab Results  Component Value Date   LABMICR See below: 12/04/2020   LABMICR See below: 07/16/2020   MICROALBUR 2.7 (H) 01/16/2021   MICROALBUR 4.1 (H) 04/21/2016

## 2021-02-06 NOTE — Patient Instructions (Signed)
Your recent renal ultrasound suggested that you have fatty liver.      Fatty liver can not be cured, but if it is not managed,  It can lead to cirrhosis and liver failure and is becoming increasingly more common because of the prevalence of obesity in adults in the [place].  The best way to treat it is :  1) maintain a healthy weight with a low glycemic index diet and regular exercise  2) start a medication to manage cholesterol (atorvastatin), which we discussed today  We will repeat your liver enzymes in four week  Nonalcoholic Fatty Liver Disease Diet, Adult Nonalcoholic fatty liver disease is a condition that causes fat to build up in and around the liver. The disease makes it harder for the liver to work the way that it should. Following a healthy diet can help to keep nonalcoholic fatty liver disease under control. It can also help to prevent or improve conditions that are associated with the disease, such as heart disease, diabetes, high blood pressure, and abnormal cholesterol levels. Along with regular exercise, this diet: Promotes weight loss. Helps to control blood sugar levels. Helps to improve the way that the body uses insulin. What are tips for following this plan? Reading food labels Always check food labels for: The amount of saturated fat in a food. You should limit your intake of saturated fat. Saturated fat is found in foods that come from animals, including meat and dairy products such as butter, cheese, and whole milk. The amount of fiber in a food. You should choose high-fiber foods such as fruits, vegetables, and whole grains. Try to get 25-30 grams (g) of fiber a day.  Cooking When cooking, use heart-healthy oils that are high in monounsaturated fats. These include olive oil, canola oil, and avocado oil. Limit frying or deep-frying foods. Cook foods using healthy methods such as baking, boiling, steaming, and grilling instead. Meal planning You may want to keep  track of how many calories you take in. Eating the right amount of calories will help you achieve a healthy weight. Meeting with a registered dietitian can help you get started. Limit how often you eat takeout and fast food. These foods are usually very high in fat, salt, and sugar. Use the glycemic index (GI) to plan your meals. The index tells you how quickly a food will raise your blood sugar. Choose low-GI foods (GI less than 55). These foods take a longer time to raise blood sugar. A registered dietitian can help you identify foods lower on the GI scale. Lifestyle You may want to follow a Mediterranean diet. This diet includes a lot of vegetables, lean meats or fish, whole grains, fruits, and healthy oils and fats. What foods can I eat? Fruits Bananas. Apples. Oranges. Grapes. Papaya. Mango. Pomegranate. Kiwi. Grapefruit. Cherries. Vegetables Lettuce. Spinach. Peas. Beets. Cauliflower. Cabbage. Broccoli. Carrots. Tomatoes. Squash. Eggplant. Herbs. Peppers. Onions. Cucumbers. Brussels sprouts. Yams and sweet potatoes. Beans. Lentils. Grains Whole wheat or whole-grain foods, including breads, crackers, cereals, and pasta. Stone-ground whole wheat. Unsweetened oatmeal. Bulgur. Barley. Quinoa. Brown or wild rice. Corn or whole wheat flour tortillas. Meats and other proteins Lean meats. Poultry. Tofu. Seafood and shellfish. Dairy Low-fat or fat-free dairy products, such as yogurt, cottage cheese, or cheese. Beverages Water. Sugar-free drinks. Tea. Coffee. Low-fat or skim milk. Milk alternatives, such as soy or almond milk. Real fruit juice. Fats and oils Avocado. Canola or olive oil. Nuts and nut butters. Seeds. Seasonings and condiments Mustard.  Relish. Low-fat, low-sugar ketchup and barbecue sauce. Low-fat or fat-free mayonnaise. Sweets and desserts Sugar-free sweets. The items listed above may not be a complete list of foods and beverages you can eat. Contact a dietitian for more  information. What foods should I limit or avoid? Meats and other proteins Limit red meat to 1-2 times a week. Dairy [employer]. Fats and oils Palm oil and coconut oil. Fried foods. Other foods Processed foods. Foods that contain a lot of salt or sodium. Sweets and desserts Sweets that contain sugar. Beverages Sweetened drinks, such as sweet tea, milkshakes, iced sweet drinks, and sodas. Alcohol. The items listed above may not be a complete list of foods and beverages you should avoid. Contact a dietitian for more information. Where to find more information The [employer] of Diabetes and Digestive and Kidney Diseases: [email or web address] Summary Nonalcoholic fatty liver disease is a condition that causes fat to build up in and around the liver. Following a healthy diet can help to keep nonalcoholic fatty liver disease under control. Your diet should be rich in fruits, vegetables, whole grains, and lean proteins. Limit your intake of saturated fat. Saturated fat is found in foods that come from animals, including meat and dairy products such as butter, cheese, and whole milk. This diet promotes weight loss, helps to control blood sugar levels, and helps to improve the way that the body uses insulin. This information is not intended to replace advice given to you by your health care provider. Make sure you discuss any questions you have with your health care provider. Document Revised: 08/25/2018 Document Reviewed: 05/25/2018 Elsevier Patient Education  2022 [employer].

## 2021-02-08 ENCOUNTER — Other Ambulatory Visit: Payer: Self-pay | Admitting: Internal Medicine

## 2021-02-08 DIAGNOSIS — E1169 Type 2 diabetes mellitus with other specified complication: Secondary | ICD-10-CM

## 2021-02-09 ENCOUNTER — Telehealth: Payer: Self-pay

## 2021-02-09 NOTE — Telephone Encounter (Signed)
LMTCB in regards to lab results.  

## 2021-02-10 DIAGNOSIS — Z1211 Encounter for screening for malignant neoplasm of colon: Secondary | ICD-10-CM | POA: Diagnosis not present

## 2021-02-14 LAB — COLOGUARD: Cologuard: NEGATIVE

## 2021-03-06 ENCOUNTER — Other Ambulatory Visit: Payer: Self-pay

## 2021-03-06 ENCOUNTER — Other Ambulatory Visit (INDEPENDENT_AMBULATORY_CARE_PROVIDER_SITE_OTHER): Payer: Medicare HMO

## 2021-03-06 DIAGNOSIS — E119 Type 2 diabetes mellitus without complications: Secondary | ICD-10-CM

## 2021-03-06 DIAGNOSIS — E785 Hyperlipidemia, unspecified: Secondary | ICD-10-CM

## 2021-03-06 DIAGNOSIS — E1169 Type 2 diabetes mellitus with other specified complication: Secondary | ICD-10-CM | POA: Diagnosis not present

## 2021-03-06 DIAGNOSIS — I10 Essential (primary) hypertension: Secondary | ICD-10-CM

## 2021-03-06 LAB — COMPREHENSIVE METABOLIC PANEL
ALT: 10 U/L (ref 0–35)
AST: 12 U/L (ref 0–37)
Albumin: 3.9 g/dL (ref 3.5–5.2)
Alkaline Phosphatase: 68 U/L (ref 39–117)
BUN: 18 mg/dL (ref 6–23)
CO2: 29 mEq/L (ref 19–32)
Calcium: 9.1 mg/dL (ref 8.4–10.5)
Chloride: 106 mEq/L (ref 96–112)
Creatinine, Ser: 0.82 mg/dL (ref 0.40–1.20)
GFR: 74.77 mL/min (ref >60.00)
Glucose, Bld: 99 mg/dL (ref 70–99)
Potassium: 3.6 mEq/L (ref 3.5–5.1)
Sodium: 143 mEq/L (ref 135–145)
Total Bilirubin: 1 mg/dL (ref 0.2–1.2)
Total Protein: 6.8 g/dL (ref 6.0–8.3)

## 2021-03-06 LAB — LIPID PANEL
Cholesterol: 173 mg/dL (ref 0–200)
HDL: 57.4 mg/dL (ref >39.00)
LDL Cholesterol: 92 mg/dL (ref 0–99)
NonHDL: 115.3
Total CHOL/HDL Ratio: 3
Triglycerides: 116 mg/dL (ref 0.0–149.0)
VLDL: 23.2 mg/dL (ref 0.0–40.0)

## 2021-03-06 LAB — BASIC METABOLIC PANEL
BUN: 18 mg/dL (ref 6–23)
CO2: 29 mEq/L (ref 19–32)
Calcium: 9.1 mg/dL (ref 8.4–10.5)
Chloride: 106 mEq/L (ref 96–112)
Creatinine, Ser: 0.82 mg/dL (ref 0.40–1.20)
GFR: 74.77 mL/min (ref >60.00)
Glucose, Bld: 99 mg/dL (ref 70–99)
Potassium: 3.6 mEq/L (ref 3.5–5.1)
Sodium: 143 mEq/L (ref 135–145)

## 2021-03-06 LAB — MICROALBUMIN / CREATININE URINE RATIO
Creatinine,U: 165.4 mg/dL
Microalb Creat Ratio: 3.4 mg/g (ref 0.0–30.0)
Microalb, Ur: 5.7 mg/dL — ABNORMAL HIGH (ref 0.0–1.9)

## 2021-03-06 LAB — HEMOGLOBIN A1C: Hgb A1c MFr Bld: 6.2 % (ref 4.6–6.5)

## 2021-05-05 ENCOUNTER — Other Ambulatory Visit: Payer: Medicare HMO

## 2021-05-19 LAB — HM DIABETES EYE EXAM

## 2021-05-20 ENCOUNTER — Other Ambulatory Visit: Payer: Self-pay

## 2021-05-20 MED ORDER — ZOSTER VAC RECOMB ADJUVANTED 50 MCG/0.5ML IM SUSR
INTRAMUSCULAR | 1 refills | Status: DC
  Filled 2021-05-20 – 2021-05-21 (×2): qty 0.5, 1d supply, fill #0
  Filled 2022-01-01 – 2022-01-26 (×2): qty 0.5, 1d supply, fill #1

## 2021-05-21 ENCOUNTER — Ambulatory Visit: Payer: Medicare HMO

## 2021-05-21 ENCOUNTER — Other Ambulatory Visit: Payer: Self-pay

## 2021-05-25 DIAGNOSIS — E785 Hyperlipidemia, unspecified: Secondary | ICD-10-CM | POA: Diagnosis not present

## 2021-05-25 DIAGNOSIS — Z8249 Family history of ischemic heart disease and other diseases of the circulatory system: Secondary | ICD-10-CM | POA: Diagnosis not present

## 2021-05-25 DIAGNOSIS — Z008 Encounter for other general examination: Secondary | ICD-10-CM | POA: Diagnosis not present

## 2021-05-25 DIAGNOSIS — I10 Essential (primary) hypertension: Secondary | ICD-10-CM | POA: Diagnosis not present

## 2021-05-25 DIAGNOSIS — E669 Obesity, unspecified: Secondary | ICD-10-CM | POA: Diagnosis not present

## 2021-05-25 DIAGNOSIS — I951 Orthostatic hypotension: Secondary | ICD-10-CM | POA: Diagnosis not present

## 2021-05-25 DIAGNOSIS — Z803 Family history of malignant neoplasm of breast: Secondary | ICD-10-CM | POA: Diagnosis not present

## 2021-05-25 DIAGNOSIS — Z6834 Body mass index (BMI) 34.0-34.9, adult: Secondary | ICD-10-CM | POA: Diagnosis not present

## 2021-05-25 DIAGNOSIS — Z833 Family history of diabetes mellitus: Secondary | ICD-10-CM | POA: Diagnosis not present

## 2021-05-25 DIAGNOSIS — R32 Unspecified urinary incontinence: Secondary | ICD-10-CM | POA: Diagnosis not present

## 2021-06-30 ENCOUNTER — Ambulatory Visit
Admission: RE | Admit: 2021-06-30 | Discharge: 2021-06-30 | Disposition: A | Discharge status: Home / Self Care | Payer: Medicare HMO | Source: Ambulatory Visit | Attending: Internal Medicine | Admitting: Internal Medicine

## 2021-06-30 ENCOUNTER — Other Ambulatory Visit: Payer: Self-pay

## 2021-06-30 DIAGNOSIS — Z1231 Encounter for screening mammogram for malignant neoplasm of breast: Secondary | ICD-10-CM | POA: Diagnosis not present

## 2021-06-30 DIAGNOSIS — M85832 Other specified disorders of bone density and structure, left forearm: Secondary | ICD-10-CM | POA: Diagnosis not present

## 2021-06-30 DIAGNOSIS — Z780 Asymptomatic menopausal state: Secondary | ICD-10-CM | POA: Diagnosis not present

## 2021-07-02 ENCOUNTER — Other Ambulatory Visit: Payer: Self-pay

## 2021-07-02 ENCOUNTER — Ambulatory Visit: Payer: Medicare HMO | Attending: Internal Medicine

## 2021-07-02 DIAGNOSIS — Z23 Encounter for immunization: Secondary | ICD-10-CM

## 2021-07-02 MED ORDER — PFIZER COVID-19 VAC BIVALENT 30 MCG/0.3ML IM SUSP
INTRAMUSCULAR | 0 refills | Status: DC
  Filled 2021-07-02: qty 0.3, 1d supply, fill #0

## 2021-07-02 NOTE — Progress Notes (Signed)
° °  Covid-19 Vaccination Clinic  Name:  Mary Chen    MRN: [id number] DOB: Dec 23, 1955  07/02/2021  Ms. Bartling was observed post Covid-19 immunization for 15 minutes without incident. She was provided with Vaccine Information Sheet and instruction to access the V-Safe system.   Ms. Tobler was instructed to call 911 with any severe reactions post vaccine: Difficulty breathing  Swelling of face and throat  A fast heartbeat  A bad rash all over body  Dizziness and weakness   Immunizations Administered     Name Date Dose VIS Date Route   PFIZER Comrnaty(Gray TOP) Covid-19 Vaccine 07/02/2021  9:16 AM 0.3 mL 01/14/2021 Intramuscular   Manufacturer: [employer], [employer]   Lot: [id number]   NDC: [phone]

## 2021-07-03 ENCOUNTER — Other Ambulatory Visit: Payer: Self-pay

## 2021-07-09 ENCOUNTER — Encounter: Payer: Self-pay | Admitting: Internal Medicine

## 2021-07-09 ENCOUNTER — Other Ambulatory Visit: Payer: Self-pay

## 2021-07-09 ENCOUNTER — Ambulatory Visit (INDEPENDENT_AMBULATORY_CARE_PROVIDER_SITE_OTHER): Payer: Medicare HMO | Admitting: Internal Medicine

## 2021-07-09 VITALS — BP 110/78 | HR 51 | Temp 98.0°F | Wt 220.0 lb

## 2021-07-09 DIAGNOSIS — I10 Essential (primary) hypertension: Secondary | ICD-10-CM | POA: Diagnosis not present

## 2021-07-09 DIAGNOSIS — E559 Vitamin D deficiency, unspecified: Secondary | ICD-10-CM | POA: Diagnosis not present

## 2021-07-09 DIAGNOSIS — R5383 Other fatigue: Secondary | ICD-10-CM | POA: Diagnosis not present

## 2021-07-09 DIAGNOSIS — E1169 Type 2 diabetes mellitus with other specified complication: Secondary | ICD-10-CM | POA: Diagnosis not present

## 2021-07-09 DIAGNOSIS — K760 Fatty (change of) liver, not elsewhere classified: Secondary | ICD-10-CM

## 2021-07-09 DIAGNOSIS — E785 Hyperlipidemia, unspecified: Secondary | ICD-10-CM | POA: Diagnosis not present

## 2021-07-09 LAB — LIPID PANEL
Cholesterol: 181 mg/dL (ref 0–200)
HDL: 54.9 mg/dL (ref >39.00)
LDL Cholesterol: 106 mg/dL — ABNORMAL HIGH (ref 0–99)
NonHDL: 126.22
Total CHOL/HDL Ratio: 3
Triglycerides: 101 mg/dL (ref 0.0–149.0)
VLDL: 20.2 mg/dL (ref 0.0–40.0)

## 2021-07-09 LAB — COMPREHENSIVE METABOLIC PANEL
ALT: 11 U/L (ref 0–35)
AST: 11 U/L (ref 0–37)
Albumin: 4 g/dL (ref 3.5–5.2)
Alkaline Phosphatase: 71 U/L (ref 39–117)
BUN: 19 mg/dL (ref 6–23)
CO2: 31 mEq/L (ref 19–32)
Calcium: 9.3 mg/dL (ref 8.4–10.5)
Chloride: 105 mEq/L (ref 96–112)
Creatinine, Ser: 0.82 mg/dL (ref 0.40–1.20)
GFR: 74.59 mL/min (ref >60.00)
Glucose, Bld: 88 mg/dL (ref 70–99)
Potassium: 4.1 mEq/L (ref 3.5–5.1)
Sodium: 141 mEq/L (ref 135–145)
Total Bilirubin: 1.4 mg/dL — ABNORMAL HIGH (ref 0.2–1.2)
Total Protein: 6.6 g/dL (ref 6.0–8.3)

## 2021-07-09 LAB — HEMOGLOBIN A1C: Hgb A1c MFr Bld: 6.4 % (ref 4.6–6.5)

## 2021-07-09 MED ORDER — VITAMIN D (ERGOCALCIFEROL) 1.25 MG (50000 UNIT) PO CAPS
50000.0000 [IU] | ORAL_CAPSULE | ORAL | 3 refills | Status: DC
Start: 2021-07-09 — End: 2022-07-27
  Filled 2021-07-09: qty 12, 84d supply, fill #0
  Filled 2021-10-05: qty 12, 84d supply, fill #1
  Filled 2022-01-01: qty 12, 84d supply, fill #2
  Filled 2022-03-26: qty 12, 84d supply, fill #3

## 2021-07-09 NOTE — Assessment & Plan Note (Signed)
Recurrent.  Continue weekly megadose

## 2021-07-09 NOTE — Progress Notes (Signed)
Subjective:  Patient ID: Mary Chen, female    DOB: [birth year]  Age: 66 y.o. MRN: [id number]  CC: The primary encounter diagnosis was Hyperlipidemia LDL goal <160. Diagnoses of Hypertension, unspecified type, Fatigue, unspecified type, Hepatic steatosis, Dyslipidemia associated with type 2 diabetes mellitus ([place]), and Vitamin D deficiency were also pertinent to this visit.   This visit occurred during the SARS-CoV-2 public health emergency.  Safety protocols were in place, including screening questions prior to the visit, additional usage of staff PPE, and extensive cleaning of exam room while observing appropriate contact time as indicated for disinfecting solutions.    HPI Mary Chen presents for  Chief Complaint  Patient presents with   Follow-up    [id number] for Fatty Liver   1) type 2 DM with obesity,  fatty liver and hypertension.  Taking crestor 2/week without side effects.  BP at goal.  Not checking sugars. . Weight gain addressed:  not exercising regularly , did better in a group environment.  [facility]   Weight gain occurred over the holidays.  Tolerating start of crestor taking it 2/weekly.    Eye exam normal Jan 2023 Mary Chen eye    2) Vitamin D deficiency ; taking weekly megadose  , needs refill . Has been repeatedly low.   Outpatient Medications Prior to Visit  Medication Sig Dispense Refill   rosuvastatin (CRESTOR) 10 MG tablet One tablet twice weekly 25 tablet 2   telmisartan (MICARDIS) 20 MG tablet Take 1 tablet (20 mg total) by mouth at bedtime. 90 tablet 1   Vitamin D, Ergocalciferol, (DRISDOL) 1.25 MG (50000 UNIT) CAPS capsule Take 50,000 Units by mouth every 7 (seven) days.     Zoster Vaccine Adjuvanted (Mary Chen injection Inject into the muscle. 0.5 mL 1   COVID-19 mRNA bivalent vaccine, Mary Chen, (Mary Chen COVID-19 VAC BIVALENT) injection Inject into the muscle. 0.3 mL 0   No facility-administered medications prior to visit.     Review of Systems;  Patient denies headache, fevers, malaise, unintentional weight loss, skin rash, eye pain, sinus congestion and sinus pain, sore throat, dysphagia,  hemoptysis , cough, dyspnea, wheezing, chest pain, palpitations, orthopnea, edema, abdominal pain, nausea, melena, diarrhea, constipation, flank pain, dysuria, hematuria, urinary  Frequency, nocturia, numbness, tingling, seizures,  Focal weakness, Loss of consciousness,  Tremor, insomnia, depression, anxiety, and suicidal ideation.      Objective:  BP 110/78 (BP Location: Left Arm, Patient Position: Sitting, Cuff Size: Small)    Pulse (!) 51    Temp 98 F (36.7 C) (Oral)    Wt 220 lb (99.8 kg)    SpO2 96%    BMI 35.51 kg/m   BP Readings from Last 3 Encounters:  07/09/21 110/78  02/06/21 114/70  01/15/21 124/76    Wt Readings from Last 3 Encounters:  07/09/21 220 lb (99.8 kg)  02/06/21 211 lb 6.4 oz (95.9 kg)  01/15/21 213 lb 6.4 oz (96.8 kg)    General appearance: alert, cooperative and appears stated age Ears: normal TM's and external ear canals both ears Throat: lips, mucosa, and tongue normal; teeth and gums normal Neck: no adenopathy, no carotid bruit, supple, symmetrical, trachea midline and thyroid not enlarged, symmetric, no tenderness/mass/nodules Back: symmetric, no curvature. ROM normal. No CVA tenderness. Lungs: clear to auscultation bilaterally Heart: regular rate and rhythm, S1, S2 normal, no murmur, click, rub or gallop Abdomen: soft, non-tender; bowel sounds normal; no masses,  no organomegaly Pulses: 2+ and symmetric Skin: Skin  color, texture, turgor normal. No rashes or lesions Lymph nodes: Cervical, supraclavicular, and axillary nodes normal.  Lab Results  Component Value Date   HGBA1C 6.2 03/06/2021   HGBA1C 6.3 01/16/2021   HGBA1C 6.2 12/20/2018    Lab Results  Component Value Date   CREATININE 0.82 03/06/2021   CREATININE 0.82 03/06/2021   CREATININE 0.78 02/06/2021    Lab  Results  Component Value Date   WBC 4.8 01/16/2021   HGB 12.6 01/16/2021   HCT 41.1 01/16/2021   PLT 258.0 01/16/2021   GLUCOSE 99 03/06/2021   GLUCOSE 99 03/06/2021   CHOL 173 03/06/2021   TRIG 116.0 03/06/2021   HDL 57.40 03/06/2021   LDLDIRECT 167.0 04/21/2016   LDLCALC 92 03/06/2021   ALT 10 03/06/2021   AST 12 03/06/2021   NA 143 03/06/2021   NA 143 03/06/2021   K 3.6 03/06/2021   K 3.6 03/06/2021   CL 106 03/06/2021   CL 106 03/06/2021   CREATININE 0.82 03/06/2021   CREATININE 0.82 03/06/2021   BUN 18 03/06/2021   BUN 18 03/06/2021   CO2 29 03/06/2021   CO2 29 03/06/2021   TSH 1.59 01/16/2021   HGBA1C 6.2 03/06/2021   MICROALBUR 5.7 (H) 03/06/2021    DG Bone Density  Result Date: 06/30/2021 EXAM: DUAL X-RAY ABSORPTIOMETRY (DXA) FOR BONE MINERAL DENSITY IMPRESSION: Dear Dr Mary Chen, Your patient Mary Chen completed a FRAX assessment on 06/30/2021 using the [place] (analysis version: 14.10) manufactured by [employer]. The following summarizes the results of our evaluation. PATIENT BIOGRAPHICAL: Name: Mary Chen Patient ID: [id number] Birth Date: 11/21/1955 Height:    66.0 in. Gender:     [age]    [age]       Weight:    211.4 lbs. Ethnicity:  Black                            Exam Date: 06/30/2021 FRAX* RESULTS:  (version: 3.5) 10-year Probability of Fracture1 Major Osteoporotic Fracture2 Hip Fracture 2.7% 0.1% Population: [place] (Black) Risk Factors: None Based on Femur (Right) Neck BMD 1 -The 10-year probability of fracture may be lower than reported if the patient has received treatment. 2 -Major Osteoporotic Fracture: Clinical Spine, Forearm, Hip or Shoulder *FRAX is a Materials engineer of the [employer] for Metabolic Bone Disease, a Eva (WHO) [facility]. ASSESSMENT: The probability of a major osteoporotic fracture is 2.7% within the next ten years. The probability of a hip  fracture is 0.1% within the next ten years. . Your patient Mary Chen completed a BMD test on 06/30/2021 using the [place] (software version: 14.10) manufactured by [employer]. The following summarizes the results of our evaluation. Technologist: MTB PATIENT BIOGRAPHICAL: Name: Jozette, Castrellon Patient ID: [id number] Birth Date: 1956/03/03 Height: 66.0 in. Gender: Female Exam Date: 06/30/2021 Weight: 211.4 lbs. Indications: Postmenopausal, Oophorectomy Bilateral, Hysterectomy Fractures: Treatments: Vitamin D DENSITOMETRY RESULTS: Site         Region     Measured Date Measured Age WHO Classification Young Adult T-score BMD         %Change vs. Previous Significant Change (*) AP Spine L2-L4 06/30/2021 66.2 Normal 0.1 1.233 g/cm2 0.1% - AP Spine L2-L4 11/16/2017 62.6 Normal 0.1 1.232 g/cm2 - - DualFemur Neck Right 06/30/2021 66.2 Normal 0.2 1.063 g/cm2 1.4% - DualFemur Neck Right  11/16/2017 62.6 Normal 0.1 1.048 g/cm2 - - DualFemur Total Mean 06/30/2021 66.2 Normal 0.9 1.122 g/cm2 0.1% - DualFemur Total Mean 11/16/2017 62.6 Normal 0.9 1.121 g/cm2 - - Left Forearm Radius 33% 06/30/2021 66.2 Osteopenia -1.5 0.743 g/cm2 0.0% - Left Forearm Radius 33% 11/16/2017 62.6 Osteopenia -1.5 0.743 g/cm2 - - ASSESSMENT: The BMD measured at Forearm Radius 33% is 0.743 g/cm2 with a T-score of -1.5. This patient is considered osteopenic according to [facility] [facility]) criteria. The scan quality is good. L-1 was excluded due to degenerative changes. Compared with prior study, there has been no significant change in the spine. Compared with prior study, there has been no significant change in the total hip. World Pharmac[facility]) criteria for post-menopausal, Caucasian Women: Normal:                   T-score at or above -1 SD Osteopenia/low bone mass: T-score between -1 and -2.5 SD Osteoporosis:             T-score at or below -2.5 SD RECOMMENDATIONS: 1. All patients should  optimize calcium and vitamin D intake. 2. Consider FDA-approved medical therapies in postmenopausal women and men aged 62 years and older, based on the following: a. A hip or vertebral(clinical or morphometric) fracture b. T-score < -2.5 at the femoral neck or spine after appropriate evaluation to exclude secondary causes c. Low bone mass (T-score between -1.0 and -2.5 at the femoral neck or spine) and a 10-year probability of a hip fracture > 3% or a 10-year probability of a major osteoporosis-related fracture > 20% based on the [place]-adapted WHO algorithm 3. Clinician judgment and/or patient preferences may indicate treatment for people with 10-year fracture probabilities above or below these levels FOLLOW-UP: People with diagnosed cases of osteoporosis or at high risk for fracture should have regular bone mineral density tests. For patients eligible for Medicare, routine testing is allowed once every 2 years. The testing frequency can be increased to one year for patients who have rapidly progressing disease, those who are receiving or discontinuing medical therapy to restore bone mass, or have additional risk factors. I have reviewed this report, and agree with the above findings. Mark A. Thornton Papas, M.D. [facility] Electronically Signed   By: Lavonia Dana M.D.   On: 06/30/2021 14:09   MM 3D SCREEN BREAST BILATERAL  Result Date: 06/30/2021 CLINICAL DATA:  Screening. EXAM: DIGITAL SCREENING BILATERAL MAMMOGRAM WITH TOMOSYNTHESIS AND CAD TECHNIQUE: Bilateral screening digital craniocaudal and mediolateral oblique mammograms were obtained. Bilateral screening digital breast tomosynthesis was performed. The images were evaluated with computer-aided detection. COMPARISON:  Previous exam(s). ACR Breast Density Category c: The breast tissue is heterogeneously dense, which may obscure small masses. FINDINGS: There are no findings suspicious for malignancy. IMPRESSION: No mammographic evidence of malignancy. A  result letter of this screening mammogram will be mailed directly to the patient. RECOMMENDATION: Screening mammogram in one year. (Code:SM-B-01Y) BI-RADS CATEGORY  1: Negative. Electronically Signed   By: Franki Cabot M.D.   On: 06/30/2021 14:27    Assessment & Plan:   Problem List Items Addressed This Visit     Dyslipidemia associated with type 2 diabetes mellitus ([place])    Her diabetes remains well-controlled on diet alone .  hemoglobin A1c has been consistently at or  less than 7.0 . Patient is up-to-date on eye exams and foot exam was normal last month. . Patient has early microalbuminuria. Patient is now willing to try  statin therapy  for CAD risk reduction , management of fatty liver and has been started on  ACE/ARB for renal protection and hypertension   Lab Results  Component Value Date   HGBA1C 6.2 03/06/2021   Lab Results  Component Value Date   LABMICR See below: 12/04/2020   LABMICR See below: 07/16/2020   MICROALBUR 5.7 (H) 03/06/2021   MICROALBUR 2.7 (H) 01/16/2021           Relevant Orders   Hemoglobin A1c   Hepatic steatosis    Weight loss encouraged with exercise.  Tolerating  Crestor 2/weekly        Hyperlipidemia LDL goal <160 - Primary   Relevant Orders   Lipid Profile   Vitamin D deficiency    Recurrent.  Continue weekly megadose       Other Visit Diagnoses     Hypertension, unspecified type       Relevant Orders   Comp Met (CMET)   Fatigue, unspecified type           I spent 30 minutes dedicated to the care of this patient on the date of this encounter to include pre-visit review of patient's medical history,  most recent imaging studies, Face-to-face time with the patient , and post visit ordering of testing and therapeutics.    Follow-up: No follow-ups on file.   Crecencio Mc, MD

## 2021-07-09 NOTE — Assessment & Plan Note (Signed)
Weight loss encouraged with exercise.  Tolerating  Crestor 2/weekly

## 2021-07-09 NOTE — Patient Instructions (Signed)
Vitamin D has bee refilled and set to [facility] pharmacy  START A REGULAR GROUP EXERCISE PROGRAM!  "JUST DO IT"

## 2021-07-09 NOTE — Assessment & Plan Note (Signed)
Her diabetes remains well-controlled on diet alone .  hemoglobin A1c has been consistently at or  less than 7.0 . Patient is up-to-date on eye exams and foot exam was normal last month. . Patient has early microalbuminuria. Patient is now willing to try  statin therapy for CAD risk reduction , management of fatty liver and has been started on  ACE/ARB for renal protection and hypertension   Lab Results  Component Value Date   HGBA1C 6.2 03/06/2021   Lab Results  Component Value Date   LABMICR See below: 12/04/2020   LABMICR See below: 07/16/2020   MICROALBUR 5.7 (H) 03/06/2021   MICROALBUR 2.7 (H) 01/16/2021

## 2021-07-14 ENCOUNTER — Other Ambulatory Visit: Payer: Self-pay | Admitting: *Deleted

## 2021-07-14 DIAGNOSIS — N200 Calculus of kidney: Secondary | ICD-10-CM

## 2021-07-15 ENCOUNTER — Other Ambulatory Visit: Payer: Self-pay

## 2021-07-16 ENCOUNTER — Ambulatory Visit: Payer: Medicare HMO | Admitting: Urology

## 2021-07-16 ENCOUNTER — Ambulatory Visit
Admission: RE | Admit: 2021-07-16 | Discharge: 2021-07-16 | Disposition: A | Discharge status: Home / Self Care | Payer: Medicare HMO | Source: Ambulatory Visit | Attending: Urology | Admitting: Urology

## 2021-07-16 ENCOUNTER — Other Ambulatory Visit: Payer: Self-pay

## 2021-07-16 ENCOUNTER — Encounter: Payer: Self-pay | Admitting: Urology

## 2021-07-16 ENCOUNTER — Ambulatory Visit
Admission: RE | Admit: 2021-07-16 | Discharge: 2021-07-16 | Disposition: A | Discharge status: Home / Self Care | Payer: Medicare HMO | Attending: Urology | Admitting: Urology

## 2021-07-16 VITALS — BP 109/68 | HR 89 | Ht 66.0 in | Wt 220.0 lb

## 2021-07-16 DIAGNOSIS — N200 Calculus of kidney: Secondary | ICD-10-CM

## 2021-07-16 DIAGNOSIS — M47816 Spondylosis without myelopathy or radiculopathy, lumbar region: Secondary | ICD-10-CM | POA: Diagnosis not present

## 2021-07-16 NOTE — Progress Notes (Signed)
? ?07/16/2021 ?9:33 AM  ? ?Mary Chen ?May 28, 1955 ?[id number] ? ?Referring provider: Sherlene Shams, MD ?[address] ?[address] ?[place],  [place] [address] ? ?Chief Complaint  ?Patient presents with  ? Nephrolithiasis  ? ? ?Urologic history: ?1.  Bilateral nephrolithiasis ?-Ureteroscopic removal left distal calculus 01/2019 ?-Left renal calculi could not be identified ?-CT with bilateral, nonobstructing renal calculi ?-Stone analysis 100% calcium oxalate monohydrate ? ? ?HPI: ?66 y.o. female presents for annual follow-up. ? ?Mary Chen July 2022 for dysuria and treated for UTI-culture positive Klebsiella ?Denies flank for pelvic pain  ?Denies dysuria, gross hematuria ?RUS April 2022 showed no urinary tract calculi ? ? ?PMH: ?Past Medical History:  ?Diagnosis Date  ? Complication of anesthesia   ? Fibrocystic disease of breast   ? History of kidney stones   ? Hx pulmonary embolism Nov 2009  ? postoperative, negaive Factor V Leuiden, neg LE ultrasound.   ? Ovarian cyst   ? PONV (postoperative nausea and vomiting)   ? ? ?Surgical History: ?Past Surgical History:  ?Procedure Laterality Date  ? ABDOMINAL HYSTERECTOMY    ? BREAST BIOPSY Left   ? neg  ? BREAST EXCISIONAL BIOPSY Left   ? neg  ? CESAREAN SECTION    ? x2  ? CYSTOSCOPY WITH STENT PLACEMENT Left 12/03/2018  ? Procedure: CYSTOSCOPY WITH STENT PLACEMENT;  Surgeon: Riki Altes, MD;  Location: [facility] ORS;  Service: Urology;  Laterality: Left;  ? CYSTOSCOPY/URETEROSCOPY/HOLMIUM LASER/STENT PLACEMENT Left 01/23/2019  ? Procedure: CYSTOSCOPY/URETEROSCOPY//STENT EXCHANGE;  Surgeon: Riki Altes, MD;  Location: [facility] OR[facility];  Service: Urology;  Laterality: Left;  ? STONE EXTRACTION WITH BASKET  01/23/2019  ? Procedure: STONE EXTRACTION WITH BASKET;  Surgeon: Riki Altes, MD;  Location: [facility] OR[facility];  Service: Urology;;  ? TOTAL ABDOMINAL HYSTERECTOMY W/ BILATERAL SALPINGOOPHORECTOMY  2009  ? for bilateral ovarian cysts and fibroid uterus  ? ? ?Home  Medications:  ?Allergies as of 07/16/2021   ?No Known Allergies ?  ? ?  ?Medication List  ?  ? ?  ? Accurate as of July 16, 2021  9:33 AM. If you have any questions, ask your nurse or doctor.  ?  ?  ? ?  ? ?rosuvastatin 10 MG tablet ?Commonly known as: CRESTOR ?One tablet twice weekly ?  ?Shingrix injection ?Generic drug: Zoster Vaccine Adjuvanted ?Inject into the muscle. ?  ?telmisartan 20 MG tablet ?Commonly known as: MICARDIS ?Take 1 tablet (20 mg total) by mouth at bedtime. ?  ?Vitamin D (Ergocalciferol) 1.25 MG (50000 UNIT) Caps capsule ?Commonly known as: DRISDOL ?Take 1 capsule (50,000 Units total) by mouth every 7 (seven) days. ?  ? ?  ? ? ?Allergies: No Known Allergies ? ?Family History: ?Family History  ?Problem Relation Age of Onset  ? Cancer Mother   ?     breast  ? Breast cancer Mother 66  ? Bipolar disorder Brother   ?     self inficted GSW   ? Cancer Father 24  ?     lung Ca mets to liver   ? ? ?Social History:  reports that she has never smoked. She has never used smokeless tobacco. She reports that she does not drink alcohol and does not use drugs. ? ? ?Physical Exam: ?BP 109/68   Pulse 89   Ht 5\' 6"  (1.676 m)   Wt 220 lb (99.8 kg)   BMI 35.51 kg/m?   ?Constitutional:  Alert and oriented, No acute distress. ?HEENT: [place]  AT, moist mucus membranes.  Trachea midline, no masses. ?Cardiovascular: No clubbing, cyanosis, or edema. ?Respiratory: Normal respiratory effort, no increased work of breathing. ?Neurologic: Grossly intact, no focal deficits, moving all 4 extremities. ?Psychiatric: Normal mood and affect. ? ? ? ?Assessment & Plan:   ? ?1.  Bilateral nephrolithiasis ?Asymptomatic ?KUB ordered ?Will notify with results ?Continue annual follow-up ? ? ?Riki Altes, MD ? ?[facility] ?[address], [address] ?[place], [place] [address] ?[phone] ? ?

## 2021-07-19 ENCOUNTER — Encounter: Payer: Self-pay | Admitting: Urology

## 2021-07-22 ENCOUNTER — Ambulatory Visit (INDEPENDENT_AMBULATORY_CARE_PROVIDER_SITE_OTHER): Payer: Medicare HMO

## 2021-07-22 VITALS — Ht 66.0 in | Wt 220.0 lb

## 2021-07-22 DIAGNOSIS — Z0000 Encounter for general adult medical examination without abnormal findings: Secondary | ICD-10-CM

## 2021-07-22 NOTE — Patient Instructions (Addendum)
Mary Chen , Thank you for taking time to come for your Medicare Wellness Visit. I appreciate your ongoing commitment to your health goals. Please review the following plan we discussed and let me know if I can assist you in the future.   These are the goals we discussed:  Goals       Patient Stated     Weight goal 180lb (pt-stated)      Eat more baked foods Stay hydrated Stay active        This is a list of the screening recommended for you and due dates:  Health Maintenance  Topic Date Due   Zoster (Shingles) Vaccine (2 of 2) 07/16/2021   COVID-19 Vaccine (3 - Booster for Pfizer series) 08/27/2021   Hemoglobin A1C  01/06/2022   Complete foot exam   01/15/2022   Eye exam for diabetics  05/19/2022   Mammogram  06/30/2022   Cologuard (Stool DNA test)  02/11/2024   Tetanus Vaccine  04/20/2025   Pneumonia Vaccine  Completed   Flu Shot  Completed   DEXA scan (bone density measurement)  Completed   Hepatitis C Screening: USPSTF Recommendation to screen - Ages 61-79 yo.  Completed   HPV Vaccine  Aged Out   Colon Cancer Screening  Discontinued    Advanced directives: not yet completed  Conditions/risks identified: none new  Follow up in one year for your annual wellness visit    Preventive Care 65 Years and Older, Female Preventive care refers to lifestyle choices and visits with your health care provider that can promote health and wellness. What does preventive care include? A yearly physical exam. This is also called an annual well check. Dental exams once or twice a year. Routine eye exams. Ask your health care provider how often you should have your eyes checked. Personal lifestyle choices, including: Daily care of your teeth and gums. Regular physical activity. Eating a healthy diet. Avoiding tobacco and drug use. Limiting alcohol use. Practicing safe sex. Taking low-dose aspirin every day. Taking vitamin and mineral supplements as recommended by your health  care provider. What happens during an annual well check? The services and screenings done by your health care provider during your annual well check will depend on your age, overall health, lifestyle risk factors, and family history of disease. Counseling  Your health care provider may ask you questions about your: Alcohol use. Tobacco use. Drug use. Emotional well-being. Home and relationship well-being. Sexual activity. Eating habits. History of falls. Memory and ability to understand (cognition). Work and work Astronomer. Reproductive health. Screening  You may have the following tests or measurements: Height, weight, and BMI. Blood pressure. Lipid and cholesterol levels. These may be checked every 5 years, or more frequently if you are over 29 years old. Skin check. Lung cancer screening. You may have this screening every year starting at age 23 if you have a 30-pack-year history of smoking and currently smoke or have quit within the past 15 years. Fecal occult blood test (FOBT) of the stool. You may have this test every year starting at age 72. Flexible sigmoidoscopy or colonoscopy. You may have a sigmoidoscopy every 5 years or a colonoscopy every 10 years starting at age 61. Hepatitis C blood test. Hepatitis B blood test. Sexually transmitted disease (STD) testing. Diabetes screening. This is done by checking your blood sugar (glucose) after you have not eaten for a while (fasting). You may have this done every 1-3 years. Bone density scan. This is done  to screen for osteoporosis. You may have this done starting at age 8. Mammogram. This may be done every 1-2 years. Talk to your health care provider about how often you should have regular mammograms. Talk with your health care provider about your test results, treatment options, and if necessary, the need for more tests. Vaccines  Your health care provider may recommend certain vaccines, such as: Influenza vaccine. This is  recommended every year. Tetanus, diphtheria, and acellular pertussis (Tdap, Td) vaccine. You may need a Td booster every 10 years. Zoster vaccine. You may need this after age 22. Pneumococcal 13-valent conjugate (PCV13) vaccine. One dose is recommended after age 85. Pneumococcal polysaccharide (PPSV23) vaccine. One dose is recommended after age 75. Talk to your health care provider about which screenings and vaccines you need and how often you need them. This information is not intended to replace advice given to you by your health care provider. Make sure you discuss any questions you have with your health care provider. Document Released: 05/30/2015 Document Revised: 01/21/2016 Document Reviewed: 03/04/2015 Elsevier Interactive Patient Education  2017 [employer].  Fall Prevention in the Home Falls can cause injuries. They can happen to people of all ages. There are many things you can do to make your home safe and to help prevent falls. What can I do on the outside of my home? Regularly fix the edges of walkways and driveways and fix any cracks. Remove anything that might make you trip as you walk through a door, such as a raised step or threshold. Trim any bushes or trees on the path to your home. Use bright outdoor lighting. Clear any walking paths of anything that might make someone trip, such as rocks or tools. Regularly check to see if handrails are loose or broken. Make sure that both sides of any steps have handrails. Any raised decks and porches should have guardrails on the edges. Have any leaves, snow, or ice cleared regularly. Use sand or salt on walking paths during winter. Clean up any spills in your garage right away. This includes oil or grease spills. What can I do in the bathroom? Use night lights. Install grab bars by the toilet and in the tub and shower. Do not use towel bars as grab bars. Use non-skid mats or decals in the tub or shower. If you need to sit down in  the shower, use a plastic, non-slip stool. Keep the floor dry. Clean up any water that spills on the floor as soon as it happens. Remove soap buildup in the tub or shower regularly. Attach bath mats securely with double-sided non-slip rug tape. Do not have throw rugs and other things on the floor that can make you trip. What can I do in the bedroom? Use night lights. Make sure that you have a light by your bed that is easy to reach. Do not use any sheets or blankets that are too big for your bed. They should not hang down onto the floor. Have a firm chair that has side arms. You can use this for support while you get dressed. Do not have throw rugs and other things on the floor that can make you trip. What can I do in the kitchen? Clean up any spills right away. Avoid walking on wet floors. Keep items that you use a lot in easy-to-reach places. If you need to reach something above you, use a strong step stool that has a grab bar. Keep electrical cords out of the  way. Do not use floor polish or wax that makes floors slippery. If you must use wax, use non-skid floor wax. Do not have throw rugs and other things on the floor that can make you trip. What can I do with my stairs? Do not leave any items on the stairs. Make sure that there are handrails on both sides of the stairs and use them. Fix handrails that are broken or loose. Make sure that handrails are as long as the stairways. Check any carpeting to make sure that it is firmly attached to the stairs. Fix any carpet that is loose or worn. Avoid having throw rugs at the top or bottom of the stairs. If you do have throw rugs, attach them to the floor with carpet tape. Make sure that you have a light switch at the top of the stairs and the bottom of the stairs. If you do not have them, ask someone to add them for you. What else can I do to help prevent falls? Wear shoes that: Do not have high heels. Have rubber bottoms. Are comfortable  and fit you well. Are closed at the toe. Do not wear sandals. If you use a stepladder: Make sure that it is fully opened. Do not climb a closed stepladder. Make sure that both sides of the stepladder are locked into place. Ask someone to hold it for you, if possible. Clearly mark and make sure that you can see: Any grab bars or handrails. First and last steps. Where the edge of each step is. Use tools that help you move around (mobility aids) if they are needed. These include: Canes. Walkers. Scooters. Crutches. Turn on the lights when you go into a dark area. Replace any light bulbs as soon as they burn out. Set up your furniture so you have a clear path. Avoid moving your furniture around. If any of your floors are uneven, fix them. If there are any pets around you, be aware of where they are. Review your medicines with your doctor. Some medicines can make you feel dizzy. This can increase your chance of falling. Ask your doctor what other things that you can do to help prevent falls. This information is not intended to replace advice given to you by your health care provider. Make sure you discuss any questions you have with your health care provider. Document Released: 02/27/2009 Document Revised: 10/09/2015 Document Reviewed: 06/07/2014 Elsevier Interactive Patient Education  2017 [employer].

## 2021-07-22 NOTE — Progress Notes (Addendum)
Subjective:   Mary Chen is a 66 y.o. female who presents for an Initial Medicare Annual Wellness Visit.  Review of Systems    No ROS.  Medicare Wellness Virtual Visit.  Visual/audio telehealth visit, UTA vital signs.   See social history for additional risk factors.   Cardiac Risk Factors include: advanced age (>36men, >54 women)     Objective:    Today's Vitals   07/22/21 1318  Weight: 220 lb (99.8 kg)  Height: 5\' 6"  (1.676 m)   Body mass index is 35.51 kg/m.  Advanced Directives 07/22/2021 12/06/2018 12/03/2018 12/02/2018  Does Patient Have a Medical Advance Directive? No No - No  Would patient like information on creating a medical advance directive? No - Patient declined No - Patient declined No - Patient declined -   Current Medications (verified) Outpatient Encounter Medications as of 07/22/2021  Medication Sig   rosuvastatin (CRESTOR) 10 MG tablet One tablet twice weekly   telmisartan (MICARDIS) 20 MG tablet Take 1 tablet (20 mg total) by mouth at bedtime.   Vitamin D, Ergocalciferol, (DRISDOL) 1.25 MG (50000 UNIT) CAPS capsule Take 1 capsule (50,000 Units total) by mouth every 7 (seven) days.   Zoster Vaccine Adjuvanted [facility]) injection Inject into the muscle.   No facility-administered encounter medications on file as of 07/22/2021.   Allergies (verified) Patient has no known allergies.   History: Past Medical History:  Diagnosis Date   Complication of anesthesia    Fibrocystic disease of breast    History of kidney stones    Hx pulmonary embolism Nov 2009   postoperative, negaive Factor V Leuiden, neg LE ultrasound.    Ovarian cyst    PONV (postoperative nausea and vomiting)    Past Surgical History:  Procedure Laterality Date   ABDOMINAL HYSTERECTOMY     BREAST BIOPSY Left    neg   BREAST EXCISIONAL BIOPSY Left    neg   CESAREAN SECTION     x2   CYSTOSCOPY WITH STENT PLACEMENT Left 12/03/2018   Procedure: CYSTOSCOPY WITH STENT PLACEMENT;   Surgeon: Riki Altes, MD;  Location: [facility] OR[facility];  Service: Urology;  Laterality: Left;   CYSTOSCOPY/URETEROSCOPY/HOLMIUM LASER/STENT PLACEMENT Left 01/23/2019   Procedure: CYSTOSCOPY/URETEROSCOPY//STENT EXCHANGE;  Surgeon: Riki Altes, MD;  Location: [facility];  Service: Urology;  Laterality: Left;   STONE EXTRACTION WITH BASKET  01/23/2019   Procedure: STONE EXTRACTION WITH BASKET;  Surgeon: Riki Altes, MD;  Location: [facility];  Service: Urology;;   TOTAL ABDOMINAL HYSTERECTOMY W/ BILATERAL SALPINGOOPHORECTOMY  2009   for bilateral ovarian cysts and fibroid uterus   Family History  Problem Relation Age of Onset   Cancer Mother        breast   Breast cancer Mother 51   Cancer Father 60       lung Ca mets to liver    Bipolar disorder Brother        self inficted GSW    Breast cancer Daughter    Social History   Socioeconomic History   Marital status: Divorced    Spouse name: Not on file   Number of children: Not on file   Years of education: Not on file   Highest education level: Not on file  Occupational History    Employer: [employer]    Comment: Accountant at [employer]  Tobacco Use   Smoking status: Never   Smokeless tobacco: Never  Vaping Use   Vaping Use: Never used  Substance and Sexual Activity  Alcohol use: No   Drug use: No   Sexual activity: Yes    Birth control/protection: Post-menopausal  Other Topics Concern   Not on file  Social History Narrative   Not on file   Social Determinants of Health   Financial Resource Strain: Low Risk    Difficulty of Paying Living Expenses: Not hard at all  Food Insecurity: No Food Insecurity   Worried About Programme researcher, broadcasting/film/video in the Last Year: Never true   Ran Out of Food in the Last Year: Never true  Transportation Needs: No Transportation Needs   Lack of Transportation (Medical): No   Lack of Transportation (Non-Medical): No  Physical Activity: Sufficiently Active   Days of Exercise per Week: 3 days   Minutes of  Exercise per Session: 50 min  Stress: No Stress Concern Present   Feeling of Stress : Not at all  Social Connections: Unknown   Frequency of Communication with Friends and Family: More than three times a week   Frequency of Social Gatherings with Friends and Family: More than three times a week   Attends Religious Services: Not on Scientist, clinical (histocompatibility and immunogenetics) or Organizations: Not on file   Attends Banker Meetings: Not on file   Marital Status: Not on file   Tobacco Counseling Counseling given: Not Answered  Clinical Intake:  Pre-visit preparation completed: Yes       Diabetes: Yes (Followed by PCP)  How often do you need to have someone help you when you read instructions, pamphlets, or other written materials from your doctor or pharmacy?: 1 - Never   Interpreter Needed?: No    Activities of Daily Living In your present state of health, do you have any difficulty performing the following activities: 07/22/2021  Hearing? N  Vision? N  Difficulty concentrating or making decisions? N  Walking or climbing stairs? N  Dressing or bathing? N  Doing errands, shopping? N  Preparing Food and eating ? N  Using the Toilet? N  In the past six months, have you accidently leaked urine? N  Do you have problems with loss of bowel control? N  Managing your Medications? N  Managing your Finances? N  Housekeeping or managing your Housekeeping? N  Some recent data might be hidden   Patient Care Team: Sherlene Shams, MD as PCP - General (Internal Medicine)  Indicate any recent Medical Services you may have received from other than [facility] providers in the past year (date may be approximate).     Assessment:   This is a routine wellness examination for [name].  Virtual Visit via Telephone Note  I connected with  Haillie P Mielke on 07/22/21 at  1:15 PM EST by telephone and verified that I am speaking with the correct person using two identifiers.  Persons  participating in the virtual visit: patient/Nurse Health Advisor   I discussed the limitations, risks, security and privacy concerns of performing an evaluation and management service by telephone and the availability of in person appointments. The patient expressed understanding and agreed to proceed.  Interactive audio and video telecommunications were attempted between this nurse and patient, however failed, due to patient having technical difficulties OR patient did not have access to video capability.  We continued and completed visit with audio only.  Some vital signs may be absent or patient reported.   Hearing/Vision screen Hearing Screening - Comments:: Patient is able to hear conversational tones without difficulty.  No issues reported.  Vision Screening - Comments:: Wears corrective lenses They have seen their ophthalmologist in the last 12 months.   Dietary issues and exercise activities discussed: Current Exercise Habits: Home exercise routine;Structured exercise class, Type of exercise: strength training/weights;calisthenics, Time (Minutes): 45, Frequency (Times/Week): 3, Weekly Exercise (Minutes/Week): 135, Intensity: Mild Healthy diet Good water intake   Goals Addressed               This Visit's Progress     Patient Stated     Weight goal 180lb (pt-stated)        Eat more baked foods Stay hydrated Stay active       Depression Screen PHQ 2/9 Scores 07/22/2021 07/09/2021 01/15/2021 09/30/2017 04/21/2015 06/21/2012 06/21/2012  PHQ - 2 Score 0 0 0 0 0 0 0  PHQ- 9 Score - - - 0 - - -    Fall Risk Fall Risk  07/22/2021 07/09/2021 01/15/2021 04/23/2019 04/21/2015  Falls in the past year? 0 0 0 0 No  Number falls in past yr: 0 - 0 - -  Injury with Fall? - - 0 - -  Risk for fall due to : - No Fall Risks No Fall Risks - -  Follow up Falls evaluation completed Falls evaluation completed Falls evaluation completed Falls evaluation completed -    FALL RISK PREVENTION PERTAINING TO  THE HOME: Home free of loose throw rugs in walkways, pet beds, electrical cords, etc? Yes  Adequate lighting in your home to reduce risk of falls? Yes   ASSISTIVE DEVICES UTILIZED TO PREVENT FALLS:  Life alert? No  Use of a cane, walker or w/c? No  Grab bars in the bathroom? No  Shower chair or bench in shower? No  Elevated toilet seat or a handicapped toilet? No   TIMED UP AND GO: Was the test performed? No .   Cognitive Function:  Patient is alert and oriented x3.  Manages her own finances and medications without issues.    6CIT Screen 07/22/2021  What Year? 0 points  What month? 0 points  What time? 0 points  Count back from 20 0 points  Months in reverse 0 points  Repeat phrase 0 points  Total Score 0    Immunizations Immunization History  Administered Date(s) Administered   Fluad Quad(high Dose 65+) 01/15/2021   Influenza Split 01/23/2013   Influenza-Unspecified 02/15/2012, 02/12/2015, 02/20/2016, 02/19/2018, 02/20/2019   PFIZER(Purple Top)SARS-COV-2 Vaccination 03/04/2020   PNEUMOCOCCAL CONJUGATE-20 01/15/2021   Pfizer Covid-19 Vaccine Bivalent Booster 50yrs & up 07/02/2021   Tdap 04/21/2015   Zoster Recombinat (Shingrix) 05/21/2021   Shingrix Completed?: No.    Education has been provided regarding the importance of this vaccine. Patient has been advised to call insurance company to determine out of pocket expense if they have not yet received this vaccine. Advised may also receive vaccine at local pharmacy or Health Dept. Verbalized acceptance and understanding.  Screening Tests Health Maintenance  Topic Date Due   Zoster Vaccines- Shingrix (2 of 2) 07/16/2021   COVID-19 Vaccine (3 - Booster for Pfizer series) 08/27/2021   HEMOGLOBIN A1C  01/06/2022   FOOT EXAM  01/15/2022   OPHTHALMOLOGY EXAM  05/19/2022   MAMMOGRAM  06/30/2022   Fecal DNA (Cologuard)  02/11/2024   TETANUS/TDAP  04/20/2025   Pneumonia Vaccine 54+ Years old  Completed   INFLUENZA VACCINE   Completed   DEXA SCAN  Completed   Hepatitis C Screening  Completed   HPV VACCINES  Aged Out   COLONOSCOPY (Pts  45-43yrs Insurance coverage will need to be confirmed)  Discontinued   Health Maintenance Health Maintenance Due  Topic Date Due   Zoster Vaccines- Shingrix (2 of 2) 07/16/2021   Lung Cancer Screening: (Low Dose CT Chest recommended if Age 90-80 years, 30 pack-year currently smoking OR have quit w/in 15years.) does not qualify.   Vision Screening: Recommended annual ophthalmology exams for early detection of glaucoma and other disorders of the eye.  Dental Screening: Recommended annual dental exams for proper oral hygiene  Community Resource Referral / Chronic Care Management: CRR required this visit?  No   CCM required this visit?  No      Plan:     I have personally reviewed and noted the following in the patient's chart:   Medical and social history Use of alcohol, tobacco or illicit drugs  Current medications and supplements including opioid prescriptions. Patient is not currently taking opioid prescriptions. Functional ability and status Nutritional status Physical activity Advanced directives List of other physicians Hospitalizations, surgeries, and ER visits in previous 12 months Vitals Screenings to include cognitive, depression, and falls Referrals and appointments  In addition, I have reviewed and discussed with patient certain preventive protocols, quality metrics, and best practice recommendations. A written personalized care plan for preventive services as well as general preventive health recommendations were provided to patient.     OBrien-Blaney, Wilkie Zenon L, LPN   05/22/1094     I have reviewed the above information and agree with above.   Duncan Dull, MD

## 2021-09-22 ENCOUNTER — Telehealth: Payer: Self-pay

## 2021-09-22 MED ORDER — TELMISARTAN 20 MG PO TABS: 20.0000 mg | ORAL_TABLET | Freq: Every day | ORAL | 1 refills | Status: DC

## 2021-09-22 NOTE — Telephone Encounter (Signed)
Medication has been refilled.

## 2021-09-22 NOTE — Telephone Encounter (Signed)
Patient called to request refill for telmisartan (90 pills). ? ?Patient states preferred pharmacy is the [facility] on [address]. ?

## 2021-10-05 ENCOUNTER — Other Ambulatory Visit: Payer: Self-pay

## 2021-10-05 ENCOUNTER — Telehealth: Payer: Self-pay

## 2021-10-05 MED ORDER — ROSUVASTATIN CALCIUM 10 MG PO TABS: ORAL_TABLET | ORAL | 2 refills | Status: DC

## 2021-10-05 NOTE — Telephone Encounter (Signed)
Medication has been refilled.

## 2021-10-05 NOTE — Telephone Encounter (Signed)
Patient states she needs refill for rosuvastatin.  Patient states her preferred pharmacy is [facility] on [address].

## 2021-10-06 ENCOUNTER — Other Ambulatory Visit: Payer: Self-pay | Admitting: Internal Medicine

## 2022-01-03 ENCOUNTER — Other Ambulatory Visit: Payer: Self-pay

## 2022-01-04 ENCOUNTER — Other Ambulatory Visit: Payer: Self-pay

## 2022-01-20 ENCOUNTER — Encounter: Payer: Self-pay | Admitting: Internal Medicine

## 2022-01-20 ENCOUNTER — Ambulatory Visit (INDEPENDENT_AMBULATORY_CARE_PROVIDER_SITE_OTHER): Payer: Medicare HMO | Admitting: Internal Medicine

## 2022-01-20 VITALS — BP 104/76 | HR 69 | Temp 98.1°F | Ht 66.0 in | Wt 219.4 lb

## 2022-01-20 DIAGNOSIS — Z0000 Encounter for general adult medical examination without abnormal findings: Secondary | ICD-10-CM | POA: Diagnosis not present

## 2022-01-20 DIAGNOSIS — E119 Type 2 diabetes mellitus without complications: Secondary | ICD-10-CM | POA: Diagnosis not present

## 2022-01-20 DIAGNOSIS — R5383 Other fatigue: Secondary | ICD-10-CM | POA: Diagnosis not present

## 2022-01-20 DIAGNOSIS — E1169 Type 2 diabetes mellitus with other specified complication: Secondary | ICD-10-CM

## 2022-01-20 DIAGNOSIS — K760 Fatty (change of) liver, not elsewhere classified: Secondary | ICD-10-CM | POA: Diagnosis not present

## 2022-01-20 DIAGNOSIS — E785 Hyperlipidemia, unspecified: Secondary | ICD-10-CM | POA: Diagnosis not present

## 2022-01-20 LAB — CBC WITH DIFFERENTIAL/PLATELET
Basophils Absolute: 0 10*3/uL (ref 0.0–0.1)
Basophils Relative: 0.7 % (ref 0.0–3.0)
Eosinophils Absolute: 0.1 10*3/uL (ref 0.0–0.7)
Eosinophils Relative: 1.2 % (ref 0.0–5.0)
HCT: 40.7 % (ref 36.0–46.0)
Hemoglobin: 12.9 g/dL (ref 12.0–15.0)
Lymphocytes Relative: 46.3 % — ABNORMAL HIGH (ref 12.0–46.0)
Lymphs Abs: 2.1 10*3/uL (ref 0.7–4.0)
MCHC: 31.8 g/dL (ref 30.0–36.0)
MCV: 70 fl — ABNORMAL LOW (ref 78.0–100.0)
Monocytes Absolute: 0.5 10*3/uL (ref 0.1–1.0)
Monocytes Relative: 10.1 % (ref 3.0–12.0)
Neutro Abs: 1.9 10*3/uL (ref 1.4–7.7)
Neutrophils Relative %: 41.7 % — ABNORMAL LOW (ref 43.0–77.0)
Platelets: 271 10*3/uL (ref 150.0–400.0)
RBC: 5.82 Mil/uL — ABNORMAL HIGH (ref 3.87–5.11)
RDW: 17.1 % — ABNORMAL HIGH (ref 11.5–15.5)
WBC: 4.6 10*3/uL (ref 4.0–10.5)

## 2022-01-20 LAB — COMPREHENSIVE METABOLIC PANEL
ALT: 14 U/L (ref 0–35)
AST: 12 U/L (ref 0–37)
Albumin: 3.9 g/dL (ref 3.5–5.2)
Alkaline Phosphatase: 72 U/L (ref 39–117)
BUN: 19 mg/dL (ref 6–23)
CO2: 27 mEq/L (ref 19–32)
Calcium: 9 mg/dL (ref 8.4–10.5)
Chloride: 104 mEq/L (ref 96–112)
Creatinine, Ser: 0.81 mg/dL (ref 0.40–1.20)
GFR: 75.41 mL/min (ref >60.00)
Glucose, Bld: 86 mg/dL (ref 70–99)
Potassium: 4.1 mEq/L (ref 3.5–5.1)
Sodium: 140 mEq/L (ref 135–145)
Total Bilirubin: 1.4 mg/dL — ABNORMAL HIGH (ref 0.2–1.2)
Total Protein: 6.9 g/dL (ref 6.0–8.3)

## 2022-01-20 LAB — LIPID PANEL
Cholesterol: 205 mg/dL — ABNORMAL HIGH (ref 0–200)
HDL: 61.1 mg/dL (ref >39.00)
LDL Cholesterol: 124 mg/dL — ABNORMAL HIGH (ref 0–99)
NonHDL: 143.5
Total CHOL/HDL Ratio: 3
Triglycerides: 97 mg/dL (ref 0.0–149.0)
VLDL: 19.4 mg/dL (ref 0.0–40.0)

## 2022-01-20 LAB — MICROALBUMIN / CREATININE URINE RATIO
Creatinine,U: 130.7 mg/dL
Microalb Creat Ratio: 1.7 mg/g (ref 0.0–30.0)
Microalb, Ur: 2.2 mg/dL — ABNORMAL HIGH (ref 0.0–1.9)

## 2022-01-20 LAB — TSH: TSH: 0.78 u[IU]/mL (ref 0.35–5.50)

## 2022-01-20 LAB — HEMOGLOBIN A1C: Hgb A1c MFr Bld: 6.4 % (ref 4.6–6.5)

## 2022-01-20 LAB — LDL CHOLESTEROL, DIRECT: Direct LDL: 132 mg/dL

## 2022-01-20 NOTE — Progress Notes (Signed)
Patient ID: Mary Chen, female    DOB: [birth year]  Age: 66 y.o. MRN: [id number]  The patient is here for annual preventive  examination and management of other chronic and acute problems.   The risk factors are reflected in the social history.  The roster of all physicians providing medical care to patient - is listed in the Snapshot section of the chart.  Activities of daily living:  The patient is 100% independent in all ADLs: dressing, toileting, feeding as well as independent mobility  Home safety : The patient has smoke detectors in the home. They wear seatbelts.  There are no firearms at home. There is no violence in the home.   There is no risks for hepatitis, STDs or HIV. There is no   history of blood transfusion. They have no travel history to infectious disease endemic areas of the world.  The patient has seen their dentist in the last six month. They have seen their eye doctor in the last year. They admit to slight hearing difficulty with regard to whispered voices and some television programs.  They have deferred audiologic testing in the last year.  They do not  have excessive sun exposure. Discussed the need for sun protection: hats, long sleeves and use of sunscreen if there is significant sun exposure.   Diet: the importance of a healthy diet is discussed. They do have a healthy diet.  She eats a high protein breakfast daily,  5 servings of fruits and vegetables daily,  and limits starches . Eats   The benefits of regular aerobic exercise were discussed. She works out 4 days per week at [employer]  group classes:  cardio,  Editor, commissioning.     Depression screen: there are no signs or vegative symptoms of depression- irritability, change in appetite, anhedonia, sadness/tearfullness.  Cognitive assessment: the patient manages all their financial and personal affairs and is actively engaged. They could relate day,date,year and events; recalled 2/3 objects at 3  minutes; performed clock-face test normally.  The following portions of the patient's history were reviewed and updated as appropriate: allergies, current medications, past family history, past medical history,  past surgical history, past social history  and problem list.  Visual acuity was not assessed per patient preference since she has regular follow up with her ophthalmologist. Hearing and body mass index were assessed and reviewed.   During the course of the visit the patient was educated and counseled about appropriate screening and preventive services including : fall prevention , diabetes screening, nutrition counseling, colorectal cancer screening, and recommended immunizations.    CC: The primary encounter diagnosis was Dyslipidemia associated with type 2 diabetes mellitus ([place]). Diagnoses of Controlled type 2 diabetes mellitus without complication, without long-term current use of insulin ([place]), Fatigue, unspecified type, Well woman exam (no gynecological exam), and Hepatic steatosis were also pertinent to this visit.  No problems    History Odena has a past medical history of Complication of anesthesia, Fibrocystic disease of breast, History of kidney stones, pulmonary embolism (Nov 2009), Ovarian cyst, and PONV (postoperative nausea and vomiting).   She has a past surgical history that includes Total abdominal hysterectomy w/ bilateral salpingoophorectomy (2009); Abdominal hysterectomy; Breast biopsy (Left); Breast excisional biopsy (Left); Cystoscopy with stent placement (Left, 12/03/2018); Cesarean section; Cystoscopy/ureteroscopy/holmium laser/stent placement (Left, 01/23/2019); and Stone extraction with basket (01/23/2019).   Her family history includes Bipolar disorder in her brother; Breast cancer in her daughter; Breast cancer (age of onset: 28) in her  mother; Cancer in her mother; Cancer (age of onset: 88) in her father.She reports that she has never smoked. She has never used  smokeless tobacco. She reports that she does not drink alcohol and does not use drugs.  Outpatient Medications Prior to Visit  Medication Sig Dispense Refill   rosuvastatin (CRESTOR) 10 MG tablet TAKE 1 TABLET BY MOUTH TWICE A WEEK 25 tablet 3   telmisartan (MICARDIS) 20 MG tablet TAKE 1 TABLET BY MOUTH AT BEDTIME 90 tablet 1   Vitamin D, Ergocalciferol, (DRISDOL) 1.25 MG (50000 UNIT) CAPS capsule Take 1 capsule (50,000 Units total) by mouth every 7 (seven) days. 12 capsule 3   Zoster Vaccine Adjuvanted [facility]) injection Inject into the muscle. 0.5 mL 1   No facility-administered medications prior to visit.    Review of Systems  Patient denies headache, fevers, malaise, unintentional weight loss, skin rash, eye pain, sinus congestion and sinus pain, sore throat, dysphagia,  hemoptysis , cough, dyspnea, wheezing, chest pain, palpitations, orthopnea, edema, abdominal pain, nausea, melena, diarrhea, constipation, flank pain, dysuria, hematuria, urinary  Frequency, nocturia, numbness, tingling, seizures,  Focal weakness, Loss of consciousness,  Tremor, insomnia, depression, anxiety, and suicidal ideation.    Objective:  BP 104/76 (BP Location: Left Arm, Patient Position: Sitting, Cuff Size: Large)   Pulse 69   Temp 98.1 F (36.7 C) (Oral)   Ht '5\' 6"'  (1.676 m)   Wt 219 lb 6.4 oz (99.5 kg)   SpO2 98%   BMI 35.41 kg/m   Physical Exam  General appearance: alert, cooperative and appears stated age Head: Normocephalic, without obvious abnormality, atraumatic Eyes: conjunctivae/corneas clear. PERRL, EOM's intact. Fundi benign. Ears: normal TM's and external ear canals both ears Nose: Nares normal. Septum midline. Mucosa normal. No drainage or sinus tenderness. Throat: lips, mucosa, and tongue normal; teeth and gums normal Neck: no adenopathy, no carotid bruit, no JVD, supple, symmetrical, trachea midline and thyroid not enlarged, symmetric, no tenderness/mass/nodules Lungs: clear to  auscultation bilaterally Breasts: normal appearance, no masses or tenderness Heart: regular rate and rhythm, S1, S2 normal, no murmur, click, rub or gallop Abdomen: soft, non-tender; bowel sounds normal; no masses,  no organomegaly Extremities: extremities normal, atraumatic, no cyanosis or edema Pulses: 2+ and symmetric Skin: Skin color, texture, turgor normal. No rashes or lesions Neurologic: Alert and oriented X 3, normal strength and tone. Normal symmetric reflexes. Normal coordination and gait.    Assessment & Plan:   Problem List Items Addressed This Visit     Dyslipidemia associated with type 2 diabetes mellitus ([place]) - Primary    Her diabetes remains well-controlled on diet alone .  hemoglobin A1c has been consistently at or  less than 7.0 . Patient is up-to-date on eye exams and foot exam is normal today. . Patient has early but improving  microalbuminuria. Patient is taking Crestor twice weekly and an ARB daily .  Lab Results  Component Value Date   HGBA1C 6.4 01/20/2022   Lab Results  Component Value Date   LABMICR See below: 12/04/2020   LABMICR See below: 07/16/2020   MICROALBUR 2.2 (H) 01/20/2022   MICROALBUR 5.7 (H) 03/06/2021           Relevant Orders   Lipid Profile (Completed)   Direct LDL (Completed)   Hepatic steatosis   Well woman exam (no gynecological exam)    age appropriate education and counseling updated, referrals for preventative services and immunizations addressed, dietary and smoking counseling addressed, most recent labs reviewed.  I have  personally reviewed and have noted:   1) the patient's medical and social history 2) The pt's use of alcohol, tobacco, and illicit drugs 3) The patient's current medications and supplements 4) Functional ability including ADL's, fall risk, home safety risk, hearing and visual impairment 5) Diet and physical activities 6) Evidence for depression or mood disorder 7) The patient's height, weight, and BMI have  been recorded in the chart  I have made referrals, and provided counseling and education based on review of the above      Other Visit Diagnoses     Controlled type 2 diabetes mellitus without complication, without long-term current use of insulin (HCC)       Relevant Orders   HgB A1c (Completed)   Comp Met (CMET) (Completed)   Microalbumin / creatinine urine ratio (Completed)   Fatigue, unspecified type       Relevant Orders   TSH (Completed)   CBC with Differential/Platelet (Completed)       I am having Denna P. Monts maintain her Zoster Vaccine Adjuvanted, Vitamin D (Ergocalciferol), telmisartan, and rosuvastatin.  No orders of the defined types were placed in this encounter.   There are no discontinued medications.  Follow-up: Return in about 6 months (around 07/21/2022).   Crecencio Mc, MD

## 2022-01-20 NOTE — Assessment & Plan Note (Signed)

## 2022-01-20 NOTE — Patient Instructions (Addendum)
I recommend getting the high dose flu vaccine in October  You do not need another DEXA scan until 2028  Your 2nd /final Shingrix vaccine was due in July.  PLEASE GO GET IT THIS WEEK

## 2022-01-21 NOTE — Assessment & Plan Note (Signed)
Her diabetes remains well-controlled on diet alone .  hemoglobin A1c has been consistently at or  less than 7.0 . Patient is up-to-date on eye exams and foot exam is normal today. . Patient has early but improving  microalbuminuria. Patient is taking Crestor twice weekly and an ARB daily .  Lab Results  Component Value Date   HGBA1C 6.4 01/20/2022   Lab Results  Component Value Date   LABMICR See below: 12/04/2020   LABMICR See below: 07/16/2020   MICROALBUR 2.2 (H) 01/20/2022   MICROALBUR 5.7 (H) 03/06/2021

## 2022-01-26 ENCOUNTER — Other Ambulatory Visit: Payer: Self-pay

## 2022-02-12 ENCOUNTER — Encounter: Payer: Self-pay | Admitting: Urology

## 2022-02-16 ENCOUNTER — Ambulatory Visit (INDEPENDENT_AMBULATORY_CARE_PROVIDER_SITE_OTHER): Payer: Medicare HMO

## 2022-02-16 DIAGNOSIS — E119 Type 2 diabetes mellitus without complications: Secondary | ICD-10-CM

## 2022-02-16 DIAGNOSIS — Z23 Encounter for immunization: Secondary | ICD-10-CM

## 2022-02-16 LAB — COMPREHENSIVE METABOLIC PANEL
ALT: 12 U/L (ref 0–35)
AST: 13 U/L (ref 0–37)
Albumin: 4 g/dL (ref 3.5–5.2)
Alkaline Phosphatase: 70 U/L (ref 39–117)
BUN: 17 mg/dL (ref 6–23)
CO2: 28 mEq/L (ref 19–32)
Calcium: 9 mg/dL (ref 8.4–10.5)
Chloride: 106 mEq/L (ref 96–112)
Creatinine, Ser: 0.9 mg/dL (ref 0.40–1.20)
GFR: 66.42 mL/min (ref >60.00)
Glucose, Bld: 90 mg/dL (ref 70–99)
Potassium: 4.1 mEq/L (ref 3.5–5.1)
Sodium: 142 mEq/L (ref 135–145)
Total Bilirubin: 1.7 mg/dL — ABNORMAL HIGH (ref 0.2–1.2)
Total Protein: 6.6 g/dL (ref 6.0–8.3)

## 2022-02-16 LAB — LIPID PANEL
Cholesterol: 186 mg/dL (ref 0–200)
HDL: 62.5 mg/dL (ref >39.00)
LDL Cholesterol: 100 mg/dL — ABNORMAL HIGH (ref 0–99)
NonHDL: 123.15
Total CHOL/HDL Ratio: 3
Triglycerides: 118 mg/dL (ref 0.0–149.0)
VLDL: 23.6 mg/dL (ref 0.0–40.0)

## 2022-02-16 LAB — HEMOGLOBIN A1C: Hgb A1c MFr Bld: 6.3 % (ref 4.6–6.5)

## 2022-03-29 ENCOUNTER — Other Ambulatory Visit: Payer: Self-pay

## 2022-03-30 ENCOUNTER — Other Ambulatory Visit: Payer: Self-pay

## 2022-06-02 DIAGNOSIS — H5213 Myopia, bilateral: Secondary | ICD-10-CM | POA: Diagnosis not present

## 2022-06-02 DIAGNOSIS — H2513 Age-related nuclear cataract, bilateral: Secondary | ICD-10-CM | POA: Diagnosis not present

## 2022-06-02 DIAGNOSIS — H524 Presbyopia: Secondary | ICD-10-CM | POA: Diagnosis not present

## 2022-06-02 LAB — HM DIABETES EYE EXAM

## 2022-06-28 ENCOUNTER — Other Ambulatory Visit: Payer: Self-pay | Admitting: Internal Medicine

## 2022-06-28 NOTE — Telephone Encounter (Signed)
Refilled: 07/09/2021 Last OV: 01/20/2022 Next OV: 07/21/2022

## 2022-06-29 ENCOUNTER — Other Ambulatory Visit: Payer: Self-pay

## 2022-07-02 ENCOUNTER — Other Ambulatory Visit: Payer: Self-pay

## 2022-07-19 ENCOUNTER — Ambulatory Visit: Payer: Medicare HMO | Admitting: Urology

## 2022-07-19 ENCOUNTER — Telehealth: Payer: Self-pay | Admitting: Internal Medicine

## 2022-07-19 NOTE — Telephone Encounter (Signed)
Called patient to schedule Medicare Annual Wellness Visit (AWV). Left message for patient to call back and schedule Medicare Annual Wellness Visit (AWV).  Last date of AWV: 07/22/2021   Please schedule an appointment at any time with Denisa, [place].  If any questions, please contact me at [phone].    Thank you,  [place] Direct dial  [phone]

## 2022-07-21 ENCOUNTER — Ambulatory Visit (INDEPENDENT_AMBULATORY_CARE_PROVIDER_SITE_OTHER): Payer: Medicare HMO | Admitting: Internal Medicine

## 2022-07-21 ENCOUNTER — Encounter: Payer: Self-pay | Admitting: Internal Medicine

## 2022-07-21 VITALS — BP 106/68 | HR 58 | Temp 98.1°F | Ht 66.0 in | Wt 224.4 lb

## 2022-07-21 DIAGNOSIS — E6609 Other obesity due to excess calories: Secondary | ICD-10-CM

## 2022-07-21 DIAGNOSIS — E119 Type 2 diabetes mellitus without complications: Secondary | ICD-10-CM | POA: Diagnosis not present

## 2022-07-21 DIAGNOSIS — E1169 Type 2 diabetes mellitus with other specified complication: Secondary | ICD-10-CM

## 2022-07-21 DIAGNOSIS — E785 Hyperlipidemia, unspecified: Secondary | ICD-10-CM | POA: Diagnosis not present

## 2022-07-21 DIAGNOSIS — Z6831 Body mass index (BMI) 31.0-31.9, adult: Secondary | ICD-10-CM

## 2022-07-21 DIAGNOSIS — Z1231 Encounter for screening mammogram for malignant neoplasm of breast: Secondary | ICD-10-CM

## 2022-07-21 DIAGNOSIS — N200 Calculus of kidney: Secondary | ICD-10-CM | POA: Diagnosis not present

## 2022-07-21 LAB — COMPREHENSIVE METABOLIC PANEL
ALT: 14 U/L (ref 0–35)
AST: 16 U/L (ref 0–37)
Albumin: 3.7 g/dL (ref 3.5–5.2)
Alkaline Phosphatase: 65 U/L (ref 39–117)
BUN: 21 mg/dL (ref 6–23)
CO2: 26 mEq/L (ref 19–32)
Calcium: 9.2 mg/dL (ref 8.4–10.5)
Chloride: 106 mEq/L (ref 96–112)
Creatinine, Ser: 0.8 mg/dL (ref 0.40–1.20)
GFR: 76.28 mL/min (ref >60.00)
Glucose, Bld: 95 mg/dL (ref 70–99)
Potassium: 4 mEq/L (ref 3.5–5.1)
Sodium: 141 mEq/L (ref 135–145)
Total Bilirubin: 1 mg/dL (ref 0.2–1.2)
Total Protein: 6.7 g/dL (ref 6.0–8.3)

## 2022-07-21 LAB — LIPID PANEL
Cholesterol: 186 mg/dL (ref 0–200)
HDL: 64.9 mg/dL (ref >39.00)
LDL Cholesterol: 102 mg/dL — ABNORMAL HIGH (ref 0–99)
NonHDL: 121.55
Total CHOL/HDL Ratio: 3
Triglycerides: 99 mg/dL (ref 0.0–149.0)
VLDL: 19.8 mg/dL (ref 0.0–40.0)

## 2022-07-21 LAB — LDL CHOLESTEROL, DIRECT: Direct LDL: 105 mg/dL

## 2022-07-21 LAB — HEMOGLOBIN A1C: Hgb A1c MFr Bld: 6.2 % (ref 4.6–6.5)

## 2022-07-21 MED ORDER — ROSUVASTATIN CALCIUM 10 MG PO TABS: ORAL_TABLET | ORAL | 3 refills | Status: DC

## 2022-07-21 MED ORDER — TELMISARTAN 20 MG PO TABS: 20.0000 mg | ORAL_TABLET | Freq: Every day | ORAL | 1 refills | Status: DC

## 2022-07-21 NOTE — Progress Notes (Signed)
Subjective:  Patient ID: Mary Chen, female    DOB: Oct 09, 1955  Age: 67 y.o. MRN: [id number]  CC: The primary encounter diagnosis was Encounter for screening mammogram for malignant neoplasm of breast. Diagnoses of Dyslipidemia associated with type 2 diabetes mellitus ([place]), Controlled type 2 diabetes mellitus without complication, without long-term current use of insulin ([place]), Class 1 obesity due to excess calories with serious comorbidity and body mass index (BMI) of 31.0 to 31.9 in adult, and Bilateral nephrolithiasis were also pertinent to this visit.   HPI Mary Chen presents for  Chief Complaint  Patient presents with   Medical Management of Chronic Issues    6 month follow up       Outpatient Medications Prior to Visit  Medication Sig Dispense Refill   Vitamin D, Ergocalciferol, (DRISDOL) 1.25 MG (50000 UNIT) CAPS capsule Take 1 capsule (50,000 Units total) by mouth every 7 (seven) days. 12 capsule 3   rosuvastatin (CRESTOR) 10 MG tablet TAKE 1 TABLET BY MOUTH TWICE A WEEK 25 tablet 3   telmisartan (MICARDIS) 20 MG tablet TAKE 1 TABLET BY MOUTH AT BEDTIME 90 tablet 1   Zoster Vaccine Adjuvanted [facility]) injection Inject into the muscle. 0.5 mL 1   No facility-administered medications prior to visit.    Review of Systems;  Patient denies headache, fevers, malaise, unintentional weight loss, skin rash, eye pain, sinus congestion and sinus pain, sore throat, dysphagia,  hemoptysis , cough, dyspnea, wheezing, chest pain, palpitations, orthopnea, edema, abdominal pain, nausea, melena, diarrhea, constipation, flank pain, dysuria, hematuria, urinary  Frequency, nocturia, numbness, tingling, seizures,  Focal weakness, Loss of consciousness,  Tremor, insomnia, depression, anxiety, and suicidal ideation.      Objective:  BP 106/68   Pulse (!) 58   Temp 98.1 F (36.7 C) (Oral)   Ht '5\' 6"'$  (1.676 m)   Wt 224 lb 6.4 oz (101.8 kg)   SpO2 96%   BMI 36.22 kg/m    BP Readings from Last 3 Encounters:  07/21/22 106/68  01/20/22 104/76  07/16/21 109/68    Wt Readings from Last 3 Encounters:  07/21/22 224 lb 6.4 oz (101.8 kg)  01/20/22 219 lb 6.4 oz (99.5 kg)  07/22/21 220 lb (99.8 kg)    Physical Exam Vitals reviewed.  Constitutional:      General: She is not in acute distress.    Appearance: Normal appearance. She is normal weight. She is not ill-appearing, toxic-appearing or diaphoretic.  HENT:     Head: Normocephalic.  Eyes:     General: No scleral icterus.       Right eye: No discharge.        Left eye: No discharge.     Conjunctiva/sclera: Conjunctivae normal.  Cardiovascular:     Rate and Rhythm: Normal rate and regular rhythm.     Heart sounds: Normal heart sounds.  Pulmonary:     Effort: Pulmonary effort is normal. No respiratory distress.     Breath sounds: Normal breath sounds.  Musculoskeletal:        General: Normal range of motion.  Skin:    General: Skin is warm and dry.  Neurological:     General: No focal deficit present.     Mental Status: She is alert and oriented to person, place, and time. Mental status is at baseline.  Psychiatric:        Mood and Affect: Mood normal.        Behavior: Behavior normal.  Thought Content: Thought content normal.        Judgment: Judgment normal.    Lab Results  Component Value Date   HGBA1C 6.2 07/21/2022   HGBA1C 6.3 02/16/2022   HGBA1C 6.4 01/20/2022    Lab Results  Component Value Date   CREATININE 0.80 07/21/2022   CREATININE 0.90 02/16/2022   CREATININE 0.81 01/20/2022    Lab Results  Component Value Date   WBC 4.6 01/20/2022   HGB 12.9 01/20/2022   HCT 40.7 01/20/2022   PLT 271.0 01/20/2022   GLUCOSE 95 07/21/2022   CHOL 186 07/21/2022   TRIG 99.0 07/21/2022   HDL 64.90 07/21/2022   LDLDIRECT 105.0 07/21/2022   LDLCALC 102 (H) 07/21/2022   ALT 14 07/21/2022   AST 16 07/21/2022   NA 141 07/21/2022   K 4.0 07/21/2022   CL 106 07/21/2022    CREATININE 0.80 07/21/2022   BUN 21 07/21/2022   CO2 26 07/21/2022   TSH 0.78 01/20/2022   HGBA1C 6.2 07/21/2022   MICROALBUR 2.2 (H) 01/20/2022    Abdomen 1 view (KUB)  Result Date: 07/17/2021 CLINICAL DATA:  Kidney stone. History of known right-sided kidney stone. EXAM: ABDOMEN - 1 VIEW COMPARISON:  KUB 07/29/2020; CT abdomen and pelvis 12/02/2018 FINDINGS: High-grade stool is again seen throughout the colon obscuring the bilateral kidneys. No significant change in clusters of small stones within the likely right mid and lower pole and the left lower pole not significantly changed from 07/29/2020 radiographs. Mild multilevel degenerative disc changes of the lumbar spine. IMPRESSION: No significant change from 07/29/2020 radiograph. Small clusters of right mid and lower pole and left lower pole renal stones. Electronically Signed   By: Yvonne Kendall M.D.   On: 07/17/2021 17:55    Assessment & Plan:  .Encounter for screening mammogram for malignant neoplasm of breast -     3D Screening Mammogram, Left and Right; Future  Dyslipidemia associated with type 2 diabetes mellitus ([place]) Assessment & Plan: Her diabetes  was diagnosed in 2020 with an A1c of 6.5  she has maintained an A1c < 6.5 since then on diet alone . foot exam is normal today. . Patient has early but improving  microalbuminuria and is taking telmisartan .  Patient is taking Crestor twice weekly .she has  had an annual  eye exam  but it was not "diabetic" gnosis   Lab Results  Component Value Date   HGBA1C 6.3 02/16/2022   Lab Results  Component Value Date   LABMICR See below: 12/04/2020   LABMICR See below: 07/16/2020   MICROALBUR 2.2 (H) 01/20/2022   MICROALBUR 5.7 (H) 03/06/2021       Orders: -     Lipid panel -     LDL cholesterol, direct  Controlled type 2 diabetes mellitus without complication, without long-term current use of insulin (HCC) -     Hemoglobin A1c -     Comprehensive metabolic panel  Class 1  obesity due to excess calories with serious comorbidity and body mass index (BMI) of 31.0 to 31.9 in adult Assessment & Plan: I have congratulated her in the lifestyle changes she has made and encouarged Continued weight loss with goal of 10% of body weight over the next 6 months using a low glycemic index diet and regular exercise a minimum of 5 days per week.     Bilateral nephrolithiasis Assessment & Plan: multiple nonobstructing stones were also noted on CT scan .   Reminded to empirically increase citric  acid intake with fresh lime,orange or lemon juice daily and increase hydration with a minimum of 60 ounces of water daily    Other orders -     Rosuvastatin Calcium; TAKE 1 TABLET BY MOUTH TWICE A WEEK  Dispense: 25 tablet; Refill: 3 -     Telmisartan; Take 1 tablet (20 mg total) by mouth at bedtime.  Dispense: 90 tablet; Refill: 1     I provided 30 minutes of face-to-face time during this encounter reviewing patient's last visit with me,  recent surgical and non surgical procedures, previous  labs and imaging studies, counseling on currently addressed issues,  and post visit ordering to diagnostics and therapeutics .   Follow-up: Return in about 6 months (around 01/21/2023).   Crecencio Mc, MD

## 2022-07-21 NOTE — Assessment & Plan Note (Addendum)
I have congratulated her in the lifestyle changes she has made and encouarged Continued weight loss with goal of 10% of body weight over the next 6 months using a low glycemic index diet and regular exercise a minimum of 5 days per week.

## 2022-07-21 NOTE — Assessment & Plan Note (Signed)
multiple nonobstructing stones were also noted on CT scan .   Reminded to empirically increase citric acid intake with fresh lime,orange or lemon juice daily and increase hydration with a minimum of 60 ounces of water daily

## 2022-07-21 NOTE — Assessment & Plan Note (Addendum)
Her diabetes  was diagnosed in 2020 with an A1c of 6.5  she has maintained an A1c < 6.5 since then on diet alone . foot exam is normal today. . Patient has early but improving  microalbuminuria and is taking telmisartan .  Patient is taking Crestor twice weekly .she has  had an annual  eye exam  but it was not "diabetic" gnosis   Lab Results  Component Value Date   HGBA1C 6.3 02/16/2022   Lab Results  Component Value Date   LABMICR See below: 12/04/2020   LABMICR See below: 07/16/2020   MICROALBUR 2.2 (H) 01/20/2022   MICROALBUR 5.7 (H) 03/06/2021

## 2022-07-26 ENCOUNTER — Other Ambulatory Visit: Payer: Self-pay | Admitting: Internal Medicine

## 2022-07-27 ENCOUNTER — Other Ambulatory Visit: Payer: Self-pay

## 2022-07-27 ENCOUNTER — Other Ambulatory Visit: Payer: Self-pay | Admitting: Internal Medicine

## 2022-07-27 MED FILL — Ergocalciferol Cap 1.25 MG (50000 Unit): ORAL | 84 days supply | Qty: 12 | Fill #0 | Status: AC

## 2022-07-27 NOTE — Telephone Encounter (Signed)
Refilled: 07/09/2021 Last OV: 07/21/2022 Next OV: not scheduled

## 2022-07-30 ENCOUNTER — Ambulatory Visit
Admission: RE | Admit: 2022-07-30 | Discharge: 2022-07-30 | Disposition: A | Discharge status: Home / Self Care | Payer: Medicare HMO | Attending: Urology | Admitting: Urology

## 2022-07-30 ENCOUNTER — Ambulatory Visit
Admission: RE | Admit: 2022-07-30 | Discharge: 2022-07-30 | Disposition: A | Discharge status: Home / Self Care | Payer: Medicare HMO | Source: Ambulatory Visit | Attending: Urology | Admitting: Urology

## 2022-07-30 ENCOUNTER — Other Ambulatory Visit: Payer: Self-pay | Admitting: *Deleted

## 2022-07-30 DIAGNOSIS — N200 Calculus of kidney: Secondary | ICD-10-CM | POA: Insufficient documentation

## 2022-08-02 ENCOUNTER — Ambulatory Visit: Payer: Medicare HMO | Admitting: Urology

## 2022-08-02 ENCOUNTER — Encounter: Payer: Self-pay | Admitting: Urology

## 2022-08-02 VITALS — BP 126/82 | HR 97 | Ht 66.0 in | Wt 223.0 lb

## 2022-08-02 DIAGNOSIS — N200 Calculus of kidney: Secondary | ICD-10-CM

## 2022-08-02 NOTE — Patient Instructions (Signed)

## 2022-08-02 NOTE — Progress Notes (Signed)
08/02/2022 10:42 AM   Mary Chen 1955-07-03 [id number]  Referring provider: Crecencio Mc, MD [address] [place],  [place] [address]  Chief Complaint  Patient presents with   Nephrolithiasis    Urologic history: 1.  Bilateral nephrolithiasis -Ureteroscopic removal left distal calculus 01/2019 -CT with bilateral, nonobstructing renal calculi -Stone analysis 100% calcium oxalate monohydrate   HPI: 67 y.o. female presents for annual follow-up.  Doing well since last visit No bothersome LUTS Denies dysuria, gross hematuria Denies flank, abdominal or pelvic pain   PMH: Past Medical History:  Diagnosis Date   Complication of anesthesia    Fibrocystic disease of breast    History of kidney stones    Hx pulmonary embolism 03/2008   postoperative, negaive Factor V Leuiden, neg LE ultrasound.    Kidney stone    Ovarian cyst    PONV (postoperative nausea and vomiting)     Surgical History: Past Surgical History:  Procedure Laterality Date   ABDOMINAL HYSTERECTOMY     BREAST BIOPSY Left    neg   BREAST EXCISIONAL BIOPSY Left    neg   CESAREAN SECTION     x2   CYSTOSCOPY WITH STENT PLACEMENT Left 12/03/2018   Procedure: CYSTOSCOPY WITH STENT PLACEMENT;  Surgeon: Abbie Sons, MD;  Location: [facility] ORS;  Service: Urology;  Laterality: Left;   CYSTOSCOPY/URETEROSCOPY/HOLMIUM LASER/STENT PLACEMENT Left 01/23/2019   Procedure: CYSTOSCOPY/URETEROSCOPY//STENT EXCHANGE;  Surgeon: Abbie Sons, MD;  Location: [facility] ORS;  Service: Urology;  Laterality: Left;   STONE EXTRACTION WITH BASKET  01/23/2019   Procedure: STONE EXTRACTION WITH BASKET;  Surgeon: Abbie Sons, MD;  Location: [facility] OR[facility];  Service: Urology;;   TOTAL ABDOMINAL HYSTERECTOMY W/ BILATERAL SALPINGOOPHORECTOMY  2009   for bilateral ovarian cysts and fibroid uterus    Home Medications:  Allergies as of 08/02/2022   No Known Allergies      Medication List        Accurate as  of August 02, 2022 10:42 AM. If you have any questions, ask your nurse or doctor.          rosuvastatin 10 MG tablet Commonly known as: CRESTOR TAKE 1 TABLET BY MOUTH TWICE A WEEK   telmisartan 20 MG tablet Commonly known as: MICARDIS Take 1 tablet (20 mg total) by mouth at bedtime.   Vitamin D (Ergocalciferol) 1.25 MG (50000 UNIT) Caps capsule Commonly known as: DRISDOL Take 1 capsule (50,000 Units total) by mouth every 7 (seven) days.        Allergies: No Known Allergies  Family History: Family History  Problem Relation Age of Onset   Cancer Mother        breast   Breast cancer Mother 70   Cancer Father 45       lung Ca mets to liver    Bipolar disorder Brother        self inficted GSW    Breast cancer Daughter     Social History:  reports that she has never smoked. She has never used smokeless tobacco. She reports that she does not drink alcohol and does not use drugs.   Physical Exam: BP 126/82 (BP Location: Left Arm, Patient Position: Sitting, Cuff Size: Normal)   Pulse 97   Ht 5\' 6"  (1.676 m)   Wt 223 lb (101.2 kg)   BMI 35.99 kg/m   Constitutional:  Alert and oriented, No acute distress. HEENT: [place] AT Respiratory: Normal respiratory effort, no increased work of  breathing. Neurologic: Grossly intact, no focal deficits, moving all 4 extremities. Psychiatric: Normal mood and affect.  Pertinent imaging: Images of a KUB performed 07/30/2022 were personally reviewed and interpreted.  There are bilateral calcifications overlying the renal outlines which are stable.  No abnormal calcifications identified along the expected course of the ureters  Assessment & Plan:    1.  Bilateral nephrolithiasis Asymptomatic Continue annual follow-up; call earlier for development of flank/abdominal pain   Abbie Sons, MD  [facility] [address], [place], [place] [address] [phone]

## 2022-08-19 ENCOUNTER — Ambulatory Visit
Admission: RE | Admit: 2022-08-19 | Discharge: 2022-08-19 | Disposition: A | Discharge status: Home / Self Care | Payer: Medicare HMO | Source: Ambulatory Visit | Attending: Internal Medicine | Admitting: Internal Medicine

## 2022-08-19 DIAGNOSIS — Z1231 Encounter for screening mammogram for malignant neoplasm of breast: Secondary | ICD-10-CM | POA: Insufficient documentation

## 2022-10-17 ENCOUNTER — Other Ambulatory Visit: Payer: Self-pay

## 2022-10-17 MED FILL — Ergocalciferol Cap 1.25 MG (50000 Unit): ORAL | 84 days supply | Qty: 12 | Fill #1 | Status: AC

## 2023-01-09 MED FILL — Ergocalciferol Cap 1.25 MG (50000 Unit): ORAL | 84 days supply | Qty: 12 | Fill #2 | Status: AC

## 2023-01-10 ENCOUNTER — Other Ambulatory Visit: Payer: Self-pay

## 2023-01-13 ENCOUNTER — Other Ambulatory Visit: Payer: Self-pay

## 2023-01-13 DIAGNOSIS — I129 Hypertensive chronic kidney disease with stage 1 through stage 4 chronic kidney disease, or unspecified chronic kidney disease: Secondary | ICD-10-CM | POA: Diagnosis not present

## 2023-01-13 DIAGNOSIS — K760 Fatty (change of) liver, not elsewhere classified: Secondary | ICD-10-CM | POA: Diagnosis not present

## 2023-01-13 DIAGNOSIS — E785 Hyperlipidemia, unspecified: Secondary | ICD-10-CM | POA: Diagnosis not present

## 2023-01-13 DIAGNOSIS — Z818 Family history of other mental and behavioral disorders: Secondary | ICD-10-CM | POA: Diagnosis not present

## 2023-01-13 DIAGNOSIS — I2510 Atherosclerotic heart disease of native coronary artery without angina pectoris: Secondary | ICD-10-CM | POA: Diagnosis not present

## 2023-01-13 DIAGNOSIS — R32 Unspecified urinary incontinence: Secondary | ICD-10-CM | POA: Diagnosis not present

## 2023-01-13 DIAGNOSIS — Z833 Family history of diabetes mellitus: Secondary | ICD-10-CM | POA: Diagnosis not present

## 2023-01-13 DIAGNOSIS — Z6837 Body mass index (BMI) 37.0-37.9, adult: Secondary | ICD-10-CM | POA: Diagnosis not present

## 2023-01-13 DIAGNOSIS — Z809 Family history of malignant neoplasm, unspecified: Secondary | ICD-10-CM | POA: Diagnosis not present

## 2023-01-13 DIAGNOSIS — Z008 Encounter for other general examination: Secondary | ICD-10-CM | POA: Diagnosis not present

## 2023-01-13 DIAGNOSIS — Z8249 Family history of ischemic heart disease and other diseases of the circulatory system: Secondary | ICD-10-CM | POA: Diagnosis not present

## 2023-01-13 DIAGNOSIS — M810 Age-related osteoporosis without current pathological fracture: Secondary | ICD-10-CM | POA: Diagnosis not present

## 2023-01-26 ENCOUNTER — Telehealth: Payer: Self-pay

## 2023-01-26 ENCOUNTER — Ambulatory Visit (INDEPENDENT_AMBULATORY_CARE_PROVIDER_SITE_OTHER): Payer: Medicare HMO | Admitting: Emergency Medicine

## 2023-01-26 VITALS — Ht 66.0 in | Wt 225.0 lb

## 2023-01-26 DIAGNOSIS — Z0000 Encounter for general adult medical examination without abnormal findings: Secondary | ICD-10-CM

## 2023-01-26 DIAGNOSIS — E1169 Type 2 diabetes mellitus with other specified complication: Secondary | ICD-10-CM

## 2023-01-26 DIAGNOSIS — E119 Type 2 diabetes mellitus without complications: Secondary | ICD-10-CM

## 2023-01-26 DIAGNOSIS — E785 Hyperlipidemia, unspecified: Secondary | ICD-10-CM | POA: Diagnosis not present

## 2023-01-26 DIAGNOSIS — E6609 Other obesity due to excess calories: Secondary | ICD-10-CM

## 2023-01-26 NOTE — Telephone Encounter (Signed)
Spoke with pt and scheduled her for a yearly and for a lab appt. Labs have been ordered.

## 2023-01-26 NOTE — Patient Instructions (Addendum)
Ms. Broeker , Thank you for taking time to come for your Medicare Wellness Visit. I appreciate your ongoing commitment to your health goals. Please review the following plan we discussed and let me know if I can assist you in the future.   Referrals/Orders/Follow-Ups/Clinician Recommendations: Get the flu and covid vaccines once available at [facility] pharmacy. I have sent a message to Dr. Melina Schools CMA to call you and schedule your physical. I have requested a copy of your most recent eye exam from [facility].  This is a list of the screening recommended for you and due dates:  Health Maintenance  Topic Date Due   Eye exam for diabetics  05/19/2022   COVID-19 Vaccine (3 - 2023-24 season) 01/16/2023   Yearly kidney health urinalysis for diabetes  01/21/2023   Complete foot exam   01/21/2023   Hemoglobin A1C  01/21/2023   Flu Shot  02/15/2023*   DEXA scan (bone density measurement)  07/01/2023   Yearly kidney function blood test for diabetes  07/21/2023   Mammogram  08/19/2023   Medicare Annual Wellness Visit  01/26/2024   Cologuard (Stool DNA test)  02/11/2024   DTaP/Tdap/Td vaccine (2 - Td or Tdap) 04/20/2025   Pneumonia Vaccine  Completed   Hepatitis C Screening  Completed   Zoster (Shingles) Vaccine  Completed   HPV Vaccine  Aged Out   Colon Cancer Screening  Discontinued  *Topic was postponed. The date shown is not the original due date.    Advanced directives: (ACP Link)Information on Advanced Care Planning can be found at [facility] Directives Advance Health Care Directives ([email or web address]   Next Medicare Annual Wellness Visit scheduled for next year: Yes, 02/01/24 @ 10:30am

## 2023-01-26 NOTE — Telephone Encounter (Signed)
-----   Message from [facility] T sent at 01/26/2023 11:08 AM EDT ----- Regarding: schedule CPE Good morning, I just finished patient's AWV and noticed she needs an appointment for a CPE. Can you call the patient and schedule? Thank you.

## 2023-01-26 NOTE — Progress Notes (Signed)
Subjective:   Mary Chen is a 67 y.o. female who presents for Medicare Annual (Subsequent) preventive examination.  Visit Complete: Virtual  I connected with  Mary Chen on 01/26/23 by a audio enabled telemedicine application and verified that I am speaking with the correct person using two identifiers.  Patient Location: Home  Provider Location: Home Office  I discussed the limitations of evaluation and management by telemedicine. The patient expressed understanding and agreed to proceed.  Patient Medicare AWV questionnaire was completed by the patient on 01/25/23; I have confirmed that all information answered by patient is correct and no changes since this date.  Vital Signs: Because this visit was a virtual/telehealth visit, some criteria may be missing or patient reported. Any vitals not documented were not able to be obtained and vitals that have been documented are patient reported.    Review of Systems     Cardiac Risk Factors include: advanced age (>80men, >89 women);dyslipidemia;diabetes mellitus;obesity (BMI >30kg/m2)     Objective:    Today's Vitals   01/26/23 1033  Weight: 225 lb (102.1 kg)  Height: 5\' 6"  (1.676 m)   Body mass index is 36.32 kg/m.     01/26/2023   10:40 AM 07/22/2021    1:43 PM 12/06/2018    3:25 PM 12/03/2018    2:42 AM 12/02/2018    6:01 PM  Advanced Directives  Does Patient Have a Medical Advance Directive? No No No  No  Would patient like information on creating a medical advance directive? Yes (MAU/Ambulatory/Procedural Areas - Information given) No - Patient declined No - Patient declined No - Patient declined     Current Medications (verified) Outpatient Encounter Medications as of 01/26/2023  Medication Sig   rosuvastatin (CRESTOR) 10 MG tablet TAKE 1 TABLET BY MOUTH TWICE A WEEK   telmisartan (MICARDIS) 20 MG tablet Take 1 tablet (20 mg total) by mouth at bedtime.   Vitamin D, Ergocalciferol, (DRISDOL) 1.25 MG (50000  UNIT) CAPS capsule Take 1 capsule (50,000 Units total) by mouth every 7 (seven) days.   No facility-administered encounter medications on file as of 01/26/2023.    Allergies (verified) Patient has no known allergies.   History: Past Medical History:  Diagnosis Date   Complication of anesthesia    Fibrocystic disease of breast    History of kidney stones    Hx pulmonary embolism 03/2008   postoperative, negaive Factor V Leuiden, neg LE ultrasound.    Kidney stone    Ovarian cyst    PONV (postoperative nausea and vomiting)    Past Surgical History:  Procedure Laterality Date   ABDOMINAL HYSTERECTOMY     BREAST BIOPSY Left    neg   BREAST EXCISIONAL BIOPSY Left    neg   CESAREAN SECTION     x2   CYSTOSCOPY WITH STENT PLACEMENT Left 12/03/2018   Procedure: CYSTOSCOPY WITH STENT PLACEMENT;  Surgeon: Riki Altes, MD;  Location: [facility] OR[facility];  Service: Urology;  Laterality: Left;   CYSTOSCOPY/URETEROSCOPY/HOLMIUM LASER/STENT PLACEMENT Left 01/23/2019   Procedure: CYSTOSCOPY/URETEROSCOPY//STENT EXCHANGE;  Surgeon: Riki Altes, MD;  Location: [facility] OR[facility];  Service: Urology;  Laterality: Left;   STONE EXTRACTION WITH BASKET  01/23/2019   Procedure: STONE EXTRACTION WITH BASKET;  Surgeon: Riki Altes, MD;  Location: [facility];  Service: Urology;;   TOTAL ABDOMINAL HYSTERECTOMY W/ BILATERAL SALPINGOOPHORECTOMY  2009   for bilateral ovarian cysts and fibroid uterus   Family History  Problem Relation Age of Onset  Cancer Mother        breast   Breast cancer Mother 25   Cancer Father 16       lung Ca mets to liver    Bipolar disorder Brother        self inficted GSW    Breast cancer Daughter    Social History   Socioeconomic History   Marital status: Divorced    Spouse name: Not on file   Number of children: 2   Years of education: Not on file   Highest education level: Not on file  Occupational History    Employer: [employer]    Comment: Airline pilot at [employer]   Occupation:  retired  Tobacco Use   Smoking status: Never   Smokeless tobacco: Never  Vaping Use   Vaping status: Never Used  Substance and Sexual Activity   Alcohol use: No   Drug use: No   Sexual activity: Yes    Birth control/protection: Post-menopausal  Other Topics Concern   Not on file  Social History Narrative   Divorced, 2 daughters living in [place]   Social Determinants of Health   Financial Resource Strain: Low Risk  (01/25/2023)   Overall Financial Resource Strain (CARDIA)    Difficulty of Paying Living Expenses: Not hard at all  Food Insecurity: No Food Insecurity (01/25/2023)   Hunger Vital Sign    Worried About Running Out of Food in the Last Year: Never true    Ran Out of Food in the Last Year: Never true  Transportation Needs: No Transportation Needs (01/25/2023)   PRAPARE - Administrator, Civil Service (Medical): No    Lack of Transportation (Non-Medical): No  Physical Activity: Sufficiently Active (01/25/2023)   Exercise Vital Sign    Days of Exercise per Week: 4 days    Minutes of Exercise per Session: 40 min  Stress: No Stress Concern Present (01/25/2023)   [employer] of Occupational Health - Occupational Stress Questionnaire    Feeling of Stress : Not at all  Social Connections: Moderately Integrated (01/25/2023)   Social Connection and Isolation Panel [NHANES]    Frequency of Communication with Friends and Family: More than three times a week    Frequency of Social Gatherings with Friends and Family: More than three times a week    Attends Religious Services: More than 4 times per year    Active Member of [employer] or Organizations: Yes    Attends Engineer, structural: More than 4 times per year    Marital Status: Divorced    Tobacco Counseling Counseling given: Not Answered   Clinical Intake:  Pre-visit preparation completed: Yes  Pain : No/denies pain     BMI - recorded: 36.32 Nutritional Status: BMI > 30   Obese Nutritional Risks: None Diabetes: Yes CBG done?: No Did pt. bring in CBG monitor from home?: No  How often do you need to have someone help you when you read instructions, pamphlets, or other written materials from your doctor or pharmacy?: 1 - Never  Interpreter Needed?: No  Information entered by :: Tora Kindred, CMa   Activities of Daily Living    01/25/2023   10:10 PM  In your present state of health, do you have any difficulty performing the following activities:  Hearing? 0  Vision? 0  Difficulty concentrating or making decisions? 0  Walking or climbing stairs? 0  Dressing or bathing? 0  Doing errands, shopping? 0  Preparing Food and eating ? N  Using the Toilet? N  In the past six months, have you accidently leaked urine? N  Do you have problems with loss of bowel control? N  Managing your Medications? N  Managing your Finances? N  Housekeeping or managing your Housekeeping? N    Patient Care Team: Sherlene Shams, MD as PCP - General (Internal Medicine)  Indicate any recent Medical Services you may have received from other than [facility] providers in the past year (date may be approximate).     Assessment:   This is a routine wellness examination for Mary Chen.  Hearing/Vision screen Hearing Screening - Comments:: Denies hearing loss Vision Screening - Comments:: Gets routine eye exams   Goals Addressed               This Visit's Progress     Patient Stated (pt-stated)        Continue healthy diet      Depression Screen    01/26/2023   10:38 AM 07/21/2022    8:38 AM 01/20/2022   10:11 AM 07/22/2021    1:27 PM 07/09/2021    9:52 AM 01/15/2021   11:33 AM 09/30/2017    3:43 PM  PHQ 2/9 Scores  PHQ - 2 Score 0 0 0 0 0 0 0  PHQ- 9 Score 0      0    Fall Risk    01/25/2023   10:10 PM 07/21/2022    8:38 AM 01/20/2022   10:11 AM 07/22/2021    1:30 PM 07/09/2021    9:52 AM  Fall Risk   Falls in the past year? 0 0 1 0 0  Number falls in past yr: 0 0 0 0    Injury with Fall? 0 0 0    Risk for fall due to : No Fall Risks No Fall Risks No Fall Risks  No Fall Risks  Follow up Falls prevention discussed Falls evaluation completed Falls evaluation completed Falls evaluation completed Falls evaluation completed    MEDICARE RISK AT HOME: Medicare Risk at Home Any stairs in or around the home?: Yes If so, are there any without handrails?: No Home free of loose throw rugs in walkways, pet beds, electrical cords, etc?: Yes Adequate lighting in your home to reduce risk of falls?: Yes Life alert?: No Use of a cane, walker or w/c?: No Grab bars in the bathroom?: Yes Shower chair or bench in shower?: No Elevated toilet seat or a handicapped toilet?: Yes  TIMED UP AND GO:  Was the test performed?  No    Cognitive Function:        01/26/2023   10:41 AM 07/22/2021    1:31 PM  6CIT Screen  What Year? 0 points 0 points  What month? 0 points 0 points  What time? 0 points 0 points  Count back from 20 0 points 0 points  Months in reverse 0 points 0 points  Repeat phrase 0 points 0 points  Total Score 0 points 0 points    Immunizations Immunization History  Administered Date(s) Administered   Fluad Quad(high Dose 65+) 01/15/2021, 02/16/2022   Influenza Split 01/23/2013   Influenza-Unspecified 02/15/2012, 02/12/2015, 02/20/2016, 02/19/2018, 02/20/2019   PFIZER(Purple Top)SARS-COV-2 Vaccination 03/04/2020   PNEUMOCOCCAL CONJUGATE-20 01/15/2021   Pfizer Covid-19 Vaccine Bivalent Booster 23yrs & up 07/02/2021   Tdap 04/21/2015   Zoster Recombinant(Shingrix) 05/21/2021, 01/26/2022    TDAP status: Up to date  Flu Vaccine status: Due, Education has been provided regarding the importance of this vaccine.  Advised may receive this vaccine at local pharmacy or Health Dept. Aware to provide a copy of the vaccination record if obtained from local pharmacy or Health Dept. Verbalized acceptance and understanding.  Pneumococcal vaccine status: Up to  date  Covid-19 vaccine status: Information provided on how to obtain vaccines.   Qualifies for Shingles Vaccine? Yes   Zostavax completed No   Shingrix Completed?: Yes  Screening Tests Health Maintenance  Topic Date Due   OPHTHALMOLOGY EXAM  05/19/2022   INFLUENZA VACCINE  12/16/2022   COVID-19 Vaccine (3 - 2023-24 season) 01/16/2023   Diabetic kidney evaluation - Urine ACR  01/21/2023   FOOT EXAM  01/21/2023   HEMOGLOBIN A1C  01/21/2023   Diabetic kidney evaluation - eGFR measurement  07/21/2023   MAMMOGRAM  08/19/2023   Medicare Annual Wellness (AWV)  01/26/2024   Fecal DNA (Cologuard)  02/11/2024   DTaP/Tdap/Td (2 - Td or Tdap) 04/20/2025   Pneumonia Vaccine 43+ Years old  Completed   DEXA SCAN  Completed   Hepatitis C Screening  Completed   Zoster Vaccines- Shingrix  Completed   HPV VACCINES  Aged Out   Colonoscopy  Discontinued    Health Maintenance  Health Maintenance Due  Topic Date Due   OPHTHALMOLOGY EXAM  05/19/2022   INFLUENZA VACCINE  12/16/2022   COVID-19 Vaccine (3 - 2023-24 season) 01/16/2023   Diabetic kidney evaluation - Urine ACR  01/21/2023   FOOT EXAM  01/21/2023   HEMOGLOBIN A1C  01/21/2023    Colorectal cancer screening: Type of screening: Cologuard. Completed 02/10/21. Repeat every 3 years  Mammogram status: Completed 08/19/22. Repeat every year  Bone Density status: Completed 06/30/21. Results reflect: Bone density results: OSTEOPENIA. Repeat every 2 years.  Lung Cancer Screening: (Low Dose CT Chest recommended if Age 42-80 years, 20 pack-year currently smoking OR have quit w/in 15years.) does not qualify.   Lung Cancer Screening Referral: n/a  Additional Screening:  Hepatitis C Screening: does not qualify; Completed 01/16/21  Vision Screening: Recommended annual ophthalmology exams for early detection of glaucoma and other disorders of the eye. Is the patient up to date with their annual eye exam?  Yes  Who is the provider or what is the  name of the office in which the patient attends annual eye exams? Dr. Eli Phillips, [facility]. Last exam 06/02/22. Records requested. If pt is not established with a provider, would they like to be referred to a provider to establish care? No .   Dental Screening: Recommended annual dental exams for proper oral hygiene  Diabetic Foot Exam: Diabetic Foot Exam: Overdue, Pt has been advised about the importance in completing this exam. Pt is scheduled for diabetic foot exam on  .  Community Resource Referral / Chronic Care Management: CRR required this visit?  No   CCM required this visit?  No     Plan:     I have personally reviewed and noted the following in the patient's chart:   Medical and social history Use of alcohol, tobacco or illicit drugs  Current medications and supplements including opioid prescriptions. Patient is not currently taking opioid prescriptions. Functional ability and status Nutritional status Physical activity Advanced directives List of other physicians Hospitalizations, surgeries, and ER visits in previous 12 months Vitals Screenings to include cognitive, depression, and falls Referrals and appointments  In addition, I have reviewed and discussed with patient certain preventive protocols, quality metrics, and best practice recommendations. A written personalized care plan for preventive services as well  as general preventive health recommendations were provided to patient.     Tora Kindred, CMA   01/26/2023   After Visit Summary: (MyChart) Due to this being a telephonic visit, the after visit summary with patients personalized plan was offered to patient via MyChart   Nurse Notes:  Patient needs CPE scheduled. Message sent to CMA. Requested records for last eye exam done 05/2022 from [facility]. Patient will get flu and covid vaccines when available at [facility] pharmacy.

## 2023-03-03 ENCOUNTER — Other Ambulatory Visit: Payer: Medicare HMO

## 2023-03-03 DIAGNOSIS — E6609 Other obesity due to excess calories: Secondary | ICD-10-CM | POA: Diagnosis not present

## 2023-03-03 DIAGNOSIS — Z6831 Body mass index (BMI) 31.0-31.9, adult: Secondary | ICD-10-CM | POA: Diagnosis not present

## 2023-03-03 DIAGNOSIS — E1169 Type 2 diabetes mellitus with other specified complication: Secondary | ICD-10-CM | POA: Diagnosis not present

## 2023-03-03 DIAGNOSIS — E785 Hyperlipidemia, unspecified: Secondary | ICD-10-CM

## 2023-03-03 DIAGNOSIS — Z23 Encounter for immunization: Secondary | ICD-10-CM

## 2023-03-03 DIAGNOSIS — E66811 Obesity, class 1: Secondary | ICD-10-CM

## 2023-03-03 DIAGNOSIS — E119 Type 2 diabetes mellitus without complications: Secondary | ICD-10-CM

## 2023-03-03 LAB — TSH: TSH: 1.98 u[IU]/mL (ref 0.35–5.50)

## 2023-03-03 LAB — CBC WITH DIFFERENTIAL/PLATELET
Basophils Absolute: 0.1 10*3/uL (ref 0.0–0.1)
Basophils Relative: 2.1 % (ref 0.0–3.0)
Eosinophils Absolute: 0.1 10*3/uL (ref 0.0–0.7)
Eosinophils Relative: 1.9 % (ref 0.0–5.0)
HCT: 41.6 % (ref 36.0–46.0)
Hemoglobin: 12.8 g/dL (ref 12.0–15.0)
Lymphocytes Relative: 46.9 % — ABNORMAL HIGH (ref 12.0–46.0)
Lymphs Abs: 2.4 10*3/uL (ref 0.7–4.0)
MCHC: 30.7 g/dL (ref 30.0–36.0)
MCV: 70.8 fL — ABNORMAL LOW (ref 78.0–100.0)
Monocytes Absolute: 0.6 10*3/uL (ref 0.1–1.0)
Monocytes Relative: 11 % (ref 3.0–12.0)
Neutro Abs: 1.9 10*3/uL (ref 1.4–7.7)
Neutrophils Relative %: 38.1 % — ABNORMAL LOW (ref 43.0–77.0)
Platelets: 231 10*3/uL (ref 150.0–400.0)
RBC: 5.87 Mil/uL — ABNORMAL HIGH (ref 3.87–5.11)
RDW: 17 % — ABNORMAL HIGH (ref 11.5–15.5)
WBC: 5.1 10*3/uL (ref 4.0–10.5)

## 2023-03-03 LAB — COMPREHENSIVE METABOLIC PANEL
ALT: 12 U/L (ref 0–35)
AST: 13 U/L (ref 0–37)
Albumin: 3.9 g/dL (ref 3.5–5.2)
Alkaline Phosphatase: 65 U/L (ref 39–117)
BUN: 18 mg/dL (ref 6–23)
CO2: 27 meq/L (ref 19–32)
Calcium: 9.2 mg/dL (ref 8.4–10.5)
Chloride: 104 meq/L (ref 96–112)
Creatinine, Ser: 0.88 mg/dL (ref 0.40–1.20)
GFR: 67.74 mL/min (ref >60.00)
Glucose, Bld: 98 mg/dL (ref 70–99)
Potassium: 3.8 meq/L (ref 3.5–5.1)
Sodium: 140 meq/L (ref 135–145)
Total Bilirubin: 1 mg/dL (ref 0.2–1.2)
Total Protein: 6.8 g/dL (ref 6.0–8.3)

## 2023-03-03 LAB — LIPID PANEL
Cholesterol: 203 mg/dL — ABNORMAL HIGH (ref 0–200)
HDL: 65.5 mg/dL (ref >39.00)
LDL Cholesterol: 116 mg/dL — ABNORMAL HIGH (ref 0–99)
NonHDL: 137.82
Total CHOL/HDL Ratio: 3
Triglycerides: 110 mg/dL (ref 0.0–149.0)
VLDL: 22 mg/dL (ref 0.0–40.0)

## 2023-03-03 LAB — LDL CHOLESTEROL, DIRECT: Direct LDL: 123 mg/dL

## 2023-03-03 LAB — MICROALBUMIN / CREATININE URINE RATIO
Creatinine,U: 123.7 mg/dL
Microalb Creat Ratio: 1.2 mg/g (ref 0.0–30.0)
Microalb, Ur: 1.4 mg/dL (ref 0.0–1.9)

## 2023-03-03 LAB — HEMOGLOBIN A1C: Hgb A1c MFr Bld: 6.3 % (ref 4.6–6.5)

## 2023-03-03 NOTE — Addendum Note (Signed)
Addended by: Warden Fillers on: 03/03/2023 08:25 AM   Modules accepted: Orders

## 2023-03-08 ENCOUNTER — Other Ambulatory Visit: Payer: Self-pay | Admitting: Internal Medicine

## 2023-03-10 ENCOUNTER — Ambulatory Visit: Payer: Medicare HMO | Admitting: Internal Medicine

## 2023-03-11 ENCOUNTER — Ambulatory Visit (INDEPENDENT_AMBULATORY_CARE_PROVIDER_SITE_OTHER): Payer: Medicare HMO | Admitting: Internal Medicine

## 2023-03-11 ENCOUNTER — Encounter: Payer: Self-pay | Admitting: Internal Medicine

## 2023-03-11 VITALS — BP 124/80 | HR 74 | Temp 97.9°F | Ht 66.0 in | Wt 233.2 lb

## 2023-03-11 DIAGNOSIS — Z780 Asymptomatic menopausal state: Secondary | ICD-10-CM

## 2023-03-11 DIAGNOSIS — I152 Hypertension secondary to endocrine disorders: Secondary | ICD-10-CM

## 2023-03-11 DIAGNOSIS — Z0000 Encounter for general adult medical examination without abnormal findings: Secondary | ICD-10-CM | POA: Diagnosis not present

## 2023-03-11 DIAGNOSIS — E669 Obesity, unspecified: Secondary | ICD-10-CM

## 2023-03-11 DIAGNOSIS — E1169 Type 2 diabetes mellitus with other specified complication: Secondary | ICD-10-CM | POA: Diagnosis not present

## 2023-03-11 DIAGNOSIS — E1159 Type 2 diabetes mellitus with other circulatory complications: Secondary | ICD-10-CM | POA: Diagnosis not present

## 2023-03-11 DIAGNOSIS — E785 Hyperlipidemia, unspecified: Secondary | ICD-10-CM

## 2023-03-11 DIAGNOSIS — K760 Fatty (change of) liver, not elsewhere classified: Secondary | ICD-10-CM | POA: Diagnosis not present

## 2023-03-11 DIAGNOSIS — Z7985 Long-term (current) use of injectable non-insulin antidiabetic drugs: Secondary | ICD-10-CM | POA: Diagnosis not present

## 2023-03-11 DIAGNOSIS — Z1231 Encounter for screening mammogram for malignant neoplasm of breast: Secondary | ICD-10-CM

## 2023-03-11 DIAGNOSIS — E119 Type 2 diabetes mellitus without complications: Secondary | ICD-10-CM | POA: Insufficient documentation

## 2023-03-11 MED ORDER — TIRZEPATIDE 2.5 MG/0.5ML ~~LOC~~ SOAJ: 2.5000 mg | SUBCUTANEOUS | Status: DC

## 2023-03-11 MED ORDER — TELMISARTAN 20 MG PO TABS: 20.0000 mg | ORAL_TABLET | Freq: Every day | ORAL | 1 refills | Status: DC

## 2023-03-11 NOTE — Progress Notes (Unsigned)
Patient ID: Mary Chen, female    DOB: 05-27-1955  Age: 67 y.o. MRN: [id number]  The patient is here for annual preventive examination and management of other chronic and acute problems.   The risk factors are reflected in the social history.   The roster of all physicians providing medical care to patient - is listed in the Snapshot section of the chart.   Activities of daily living:  The patient is 100% independent in all ADLs: dressing, toileting, feeding as well as independent mobility   Home safety : The patient has smoke detectors in the home. They wear seatbelts.  There are no unsecured firearms at home. There is no violence in the home.    There is no risks for hepatitis, STDs or HIV. There is no   history of blood transfusion. They have no travel history to infectious disease endemic areas of the world.   The patient has seen their dentist in the last six month. They have seen their eye doctor in the last year. The patinet  denies slight hearing difficulty with regard to whispered voices and some television programs.  They have deferred audiologic testing in the last year.  They do not  have excessive sun exposure. Discussed the need for sun protection: hats, long sleeves and use of sunscreen if there is significant sun exposure.    Diet: the importance of a healthy diet is discussed. They do have a healthy diet.   The benefits of regular aerobic exercise were discussed. The patient  exercises  3 to 5 days per week  for  60 minutes.    Depression screen: there are no signs or vegative symptoms of depression- irritability, change in appetite, anhedonia, sadness/tearfullness.   The following portions of the patient's history were reviewed and updated as appropriate: allergies, current medications, past family history, past medical history,  past surgical history, past social history  and problem list.   Visual acuity was not assessed per patient preference since the patient has  regular follow up with an  ophthalmologist. Hearing and body mass index were assessed and reviewed.    During the course of the visit the patient was educated and counseled about appropriate screening and preventive services including : fall prevention , diabetes screening, nutrition counseling, colorectal cancer screening, and recommended immunizations.    Chief Complaint:  None   Review of Symptoms  Patient denies headache, fevers, malaise, unintentional weight loss, skin rash, eye pain, sinus congestion and sinus pain, sore throat, dysphagia,  hemoptysis , cough, dyspnea, wheezing, chest pain, palpitations, orthopnea, edema, abdominal pain, nausea, melena, diarrhea, constipation, flank pain, dysuria, hematuria, urinary  Frequency, nocturia, numbness, tingling, seizures,  Focal weakness, Loss of consciousness,  Tremor, insomnia, depression, anxiety, and suicidal ideation.    Physical Exam:  BP 124/80   Pulse 74   Temp 97.9 F (36.6 C) (Oral)   Ht 5\' 6"  (1.676 m)   Wt 233 lb 3.2 oz (105.8 kg)   SpO2 96%   BMI 37.64 kg/m    Physical Exam Vitals reviewed.  Constitutional:      General: She is not in acute distress.    Appearance: Normal appearance. She is well-developed and normal weight. She is not ill-appearing, toxic-appearing or diaphoretic.  HENT:     Head: Normocephalic.     Right Ear: Tympanic membrane, ear canal and external ear normal. There is no impacted cerumen.     Left Ear: Tympanic membrane, ear canal and external ear normal. There  is no impacted cerumen.     Nose: Nose normal.     Mouth/Throat:     Mouth: Mucous membranes are moist.     Pharynx: Oropharynx is clear.  Eyes:     General: No scleral icterus.       Right eye: No discharge.        Left eye: No discharge.     Conjunctiva/sclera: Conjunctivae normal.     Pupils: Pupils are equal, round, and reactive to light.  Neck:     Thyroid: No thyromegaly.     Vascular: No carotid bruit or JVD.   Cardiovascular:     Rate and Rhythm: Normal rate and regular rhythm.     Heart sounds: Normal heart sounds.  Pulmonary:     Effort: Pulmonary effort is normal. No respiratory distress.     Breath sounds: Normal breath sounds.  Chest:  Breasts:    Breasts are symmetrical.     Right: Normal. No swelling, inverted nipple, mass, nipple discharge, skin change or tenderness.     Left: Normal. No swelling, inverted nipple, mass, nipple discharge, skin change or tenderness.  Abdominal:     General: Bowel sounds are normal.     Palpations: Abdomen is soft. There is no mass.     Tenderness: There is no abdominal tenderness. There is no guarding or rebound.     Comments: Protuberant, striae  Musculoskeletal:        General: Normal range of motion.     Cervical back: Normal range of motion and neck supple.  Lymphadenopathy:     Cervical: No cervical adenopathy.     Upper Body:     Right upper body: No supraclavicular, axillary or pectoral adenopathy.     Left upper body: No supraclavicular, axillary or pectoral adenopathy.  Skin:    General: Skin is warm and dry.  Neurological:     General: No focal deficit present.     Mental Status: She is alert and oriented to person, place, and time. Mental status is at baseline.  Psychiatric:        Mood and Affect: Mood normal.        Behavior: Behavior normal.        Thought Content: Thought content normal.        Judgment: Judgment normal.    Assessment and Plan: Encounter for screening mammogram for malignant neoplasm of breast -     3D Screening Mammogram, Left and Right; Future  Postmenopausal estrogen deficiency -     DG Bone Density; Future  Dyslipidemia associated with type 2 diabetes mellitus (HCC) Assessment & Plan: Will increase rosuvastatin to 20 mg daily for goal LDL < 100  Lab Results  Component Value Date   CHOL 203 (H) 03/03/2023   HDL 65.50 03/03/2023   LDLCALC 116 (H) 03/03/2023   LDLDIRECT 123.0 03/03/2023   TRIG  110.0 03/03/2023   CHOLHDL 3 03/03/2023      Hepatic steatosis Assessment & Plan: LFTS are normal will recommend GLP 1 agonist given comorbid conditions of obesity and type 2 DM   Lab Results  Component Value Date   ALT 12 03/03/2023   AST 13 03/03/2023   ALKPHOS 65 03/03/2023   BILITOT 1.0 03/03/2023      Obesity, diabetes, and hypertension syndrome (HCC) Assessment & Plan:   Patient has been unable to lose or maintain a healthy weight despite good effort. She has already increased exercise involvement to include more intense aerobic activity for  30 minutes 5 days per week.  Screened for contraindications to use of  GLP 1 agonists for appetite suppression and she has none.  The risks and benefits of pharmacotherapy discussed and she is requesting a trial of therapy . Samples of mounjaro given and r x written. Continue ARB.  Foot exam normal   Lab Results  Component Value Date   LABMICR See below: 12/04/2020   LABMICR See below: 07/16/2020   MICROALBUR 1.4 03/03/2023   MICROALBUR 2.2 (H) 01/20/2022   Lab Results  Component Value Date   HGBA1C 6.3 03/03/2023      Lab Results  Component Value Date   HGBA1C 6.3 03/03/2023       Well woman exam (no gynecological exam) Assessment & Plan: age appropriate education and counseling updated, referrals for preventative services and immunizations addressed, dietary and smoking counseling addressed, most recent labs reviewed.  I have personally reviewed and have noted:   1) the patient's medical and social history 2) The pt's use of alcohol, tobacco, and illicit drugs 3) The patient's current medications and supplements 4) Functional ability including ADL's, fall risk, home safety risk, hearing and visual impairment 5) Diet and physical activities 6) Evidence for depression or mood disorder 7) The patient's height, weight, and BMI have been recorded in the chart   I have made referrals, and provided counseling and  education based on review of the above    Other orders -     Telmisartan; Take 1 tablet (20 mg total) by mouth at bedtime.  Dispense: 90 tablet; Refill: 1 -     Tirzepatide; Inject 2.5 mg into the skin once a week.  Dispense: 2 mL    No follow-ups on file.  Sherlene Shams, MD

## 2023-03-11 NOTE — Patient Instructions (Signed)
   I am prescribing a medication to help you lose weight and lower your A1c and your cholesterol called Mounjaro.   Greggory Keen is a GLP-1 receptor agonist.  It is a medication that is taken as a weekly subcutaneous injection. It is NOT insulin.  It  causes your pancreas to increase its  own insulin secretion  And also slows down the emptying of your stomach,  So it decreases your appetite and helps you lose weight. For people with diabetes,  the decreased appetite results in lower blood sugars.   The dose for the first 4 weekly doses is 2.5 mg .  You may have mild nausea on the first or second day but this should resolve.  If not  ,  stop the medication.   As long as you are losing weight,  you can continue the dose you are on . If you would like to continue it past the first 4 weeks,  send me a Clinical cytogeneticist message and I will send the next prescription to your pharmacy   FOR YOUR REFILLS,  SEND ME A MYCHART MESSAGE WITH THE FOLLOWING INFORMATION:  1)  the dose you are currently taking (2.5 mg for example)  2) whether or not you have lost weight in the preceding week (has your weight plateaued?)  I recommend that you  increase the dose every  4 weeks ONLY if your weight has plateaued.

## 2023-03-11 NOTE — Assessment & Plan Note (Signed)
LFTS are normal will recommend GLP 1 agonist given comorbid conditions of obesity and type 2 DM   Lab Results  Component Value Date   ALT 12 03/03/2023   AST 13 03/03/2023   ALKPHOS 65 03/03/2023   BILITOT 1.0 03/03/2023

## 2023-03-11 NOTE — Assessment & Plan Note (Signed)
Will increase rosuvastatin to 20 mg daily for goal LDL < 100  Lab Results  Component Value Date   CHOL 203 (H) 03/03/2023   HDL 65.50 03/03/2023   LDLCALC 116 (H) 03/03/2023   LDLDIRECT 123.0 03/03/2023   TRIG 110.0 03/03/2023   CHOLHDL 3 03/03/2023

## 2023-03-13 NOTE — Assessment & Plan Note (Addendum)
Patient has been unable to lose or maintain a healthy weight despite good effort. She has already increased exercise involvement to include more intense aerobic activity for 30 minutes 5 days per week.  Screened for contraindications to use of  GLP 1 agonists for appetite suppression and she has none.  The risks and benefits of pharmacotherapy discussed and she is requesting a trial of therapy . Samples of mounjaro given and r x written. Continue ARB.  Foot exam normal   Lab Results  Component Value Date   LABMICR See below: 12/04/2020   LABMICR See below: 07/16/2020   MICROALBUR 1.4 03/03/2023   MICROALBUR 2.2 (H) 01/20/2022   Lab Results  Component Value Date   HGBA1C 6.3 03/03/2023      Lab Results  Component Value Date   HGBA1C 6.3 03/03/2023

## 2023-03-13 NOTE — Assessment & Plan Note (Signed)

## 2023-03-23 ENCOUNTER — Other Ambulatory Visit: Payer: Self-pay

## 2023-03-23 MED FILL — Ergocalciferol Cap 1.25 MG (50000 Unit): ORAL | 84 days supply | Qty: 12 | Fill #3 | Status: AC

## 2023-04-05 ENCOUNTER — Encounter: Payer: Self-pay | Admitting: Internal Medicine

## 2023-04-05 MED ORDER — TIRZEPATIDE 2.5 MG/0.5ML ~~LOC~~ SOAJ: 2.5000 mg | SUBCUTANEOUS | Status: DC

## 2023-04-08 MED ORDER — TIRZEPATIDE 2.5 MG/0.5ML ~~LOC~~ SOAJ: 2.5000 mg | SUBCUTANEOUS | 0 refills | Status: DC

## 2023-04-08 NOTE — Addendum Note (Signed)
Addended by: Sandy Salaam on: 04/08/2023 10:52 AM   Modules accepted: Orders

## 2023-05-02 ENCOUNTER — Encounter: Payer: Self-pay | Admitting: Internal Medicine

## 2023-05-02 NOTE — Telephone Encounter (Signed)
 Care team updated and letter sent for eye exam notes.

## 2023-05-03 ENCOUNTER — Encounter: Payer: Self-pay | Admitting: Internal Medicine

## 2023-05-05 MED ORDER — TIRZEPATIDE 2.5 MG/0.5ML ~~LOC~~ SOAJ: 2.5000 mg | SUBCUTANEOUS | 1 refills | Status: DC

## 2023-05-05 NOTE — Addendum Note (Signed)
Addended by: Sandy Salaam on: 05/05/2023 11:00 AM   Modules accepted: Orders

## 2023-06-06 ENCOUNTER — Other Ambulatory Visit (HOSPITAL_COMMUNITY): Payer: Self-pay

## 2023-06-07 DIAGNOSIS — Z008 Encounter for other general examination: Secondary | ICD-10-CM | POA: Diagnosis not present

## 2023-06-07 DIAGNOSIS — E1136 Type 2 diabetes mellitus with diabetic cataract: Secondary | ICD-10-CM | POA: Diagnosis not present

## 2023-06-07 DIAGNOSIS — E1122 Type 2 diabetes mellitus with diabetic chronic kidney disease: Secondary | ICD-10-CM | POA: Diagnosis not present

## 2023-06-07 DIAGNOSIS — Z833 Family history of diabetes mellitus: Secondary | ICD-10-CM | POA: Diagnosis not present

## 2023-06-07 DIAGNOSIS — N189 Chronic kidney disease, unspecified: Secondary | ICD-10-CM | POA: Diagnosis not present

## 2023-06-07 DIAGNOSIS — M810 Age-related osteoporosis without current pathological fracture: Secondary | ICD-10-CM | POA: Diagnosis not present

## 2023-06-07 DIAGNOSIS — Z809 Family history of malignant neoplasm, unspecified: Secondary | ICD-10-CM | POA: Diagnosis not present

## 2023-06-07 DIAGNOSIS — E785 Hyperlipidemia, unspecified: Secondary | ICD-10-CM | POA: Diagnosis not present

## 2023-06-07 DIAGNOSIS — Z7985 Long-term (current) use of injectable non-insulin antidiabetic drugs: Secondary | ICD-10-CM | POA: Diagnosis not present

## 2023-06-07 DIAGNOSIS — I129 Hypertensive chronic kidney disease with stage 1 through stage 4 chronic kidney disease, or unspecified chronic kidney disease: Secondary | ICD-10-CM | POA: Diagnosis not present

## 2023-06-07 DIAGNOSIS — Z8249 Family history of ischemic heart disease and other diseases of the circulatory system: Secondary | ICD-10-CM | POA: Diagnosis not present

## 2023-06-07 DIAGNOSIS — E1159 Type 2 diabetes mellitus with other circulatory complications: Secondary | ICD-10-CM | POA: Diagnosis not present

## 2023-06-13 DIAGNOSIS — H524 Presbyopia: Secondary | ICD-10-CM | POA: Diagnosis not present

## 2023-06-13 DIAGNOSIS — H52223 Regular astigmatism, bilateral: Secondary | ICD-10-CM | POA: Diagnosis not present

## 2023-06-13 DIAGNOSIS — H5213 Myopia, bilateral: Secondary | ICD-10-CM | POA: Diagnosis not present

## 2023-06-13 DIAGNOSIS — H2513 Age-related nuclear cataract, bilateral: Secondary | ICD-10-CM | POA: Diagnosis not present

## 2023-06-14 ENCOUNTER — Encounter: Payer: Self-pay | Admitting: Internal Medicine

## 2023-06-18 MED ORDER — TIRZEPATIDE 2.5 MG/0.5ML ~~LOC~~ SOAJ: 2.5000 mg | SUBCUTANEOUS | 1 refills | Status: DC

## 2023-06-20 ENCOUNTER — Other Ambulatory Visit (HOSPITAL_COMMUNITY): Payer: Self-pay

## 2023-06-23 ENCOUNTER — Other Ambulatory Visit (HOSPITAL_COMMUNITY): Payer: Self-pay

## 2023-06-23 ENCOUNTER — Telehealth: Payer: Self-pay | Admitting: Pharmacy Technician

## 2023-06-23 NOTE — Telephone Encounter (Signed)
 Pharmacy Patient Advocate Encounter   Received notification from CoverMyMeds that prior authorization for Mounjaro  2.5MG /0.5ML auto-injectors is required/requested.   Insurance verification completed.   The patient is insured through [facility] .   Per test claim: PA required; PA submitted to above mentioned insurance via CoverMyMeds Key/confirmation #/EOC [id number] Status is pending

## 2023-06-27 ENCOUNTER — Other Ambulatory Visit (HOSPITAL_COMMUNITY): Payer: Self-pay

## 2023-06-27 NOTE — Telephone Encounter (Signed)
 Pharmacy Patient Advocate Encounter  Received notification from [facility] that Prior Authorization for Mounjaro 2.5MG /0.5ML auto-injectors  has been APPROVED from 06/25/2023 to 05/16/2024. Ran test claim, Copay is $265.55. This test claim was processed through [facility]- copay amounts may vary at other pharmacies due to pharmacy/plan contracts, or as the patient moves through the different stages of their insurance plan.   PA #/Case ID/Reference #:  [id number]

## 2023-07-06 ENCOUNTER — Other Ambulatory Visit: Payer: Self-pay | Admitting: Internal Medicine

## 2023-07-06 ENCOUNTER — Other Ambulatory Visit: Payer: Self-pay

## 2023-07-06 MED ORDER — VITAMIN D (ERGOCALCIFEROL) 1.25 MG (50000 UNIT) PO CAPS: 50000.0000 [IU] | ORAL_CAPSULE | ORAL | 3 refills | Status: DC

## 2023-07-07 MED ORDER — TIRZEPATIDE 5 MG/0.5ML ~~LOC~~ SOAJ: 5.0000 mg | SUBCUTANEOUS | 2 refills | Status: DC

## 2023-07-07 NOTE — Addendum Note (Signed)
Addended by: Sherlene Shams on: 07/07/2023 05:27 PM   Modules accepted: Orders

## 2023-07-26 ENCOUNTER — Ambulatory Visit
Admission: RE | Admit: 2023-07-26 | Discharge: 2023-07-26 | Disposition: A | Discharge status: Home / Self Care | Attending: Urology | Admitting: Urology

## 2023-07-26 ENCOUNTER — Ambulatory Visit
Admission: RE | Admit: 2023-07-26 | Discharge: 2023-07-26 | Disposition: A | Discharge status: Home / Self Care | Source: Ambulatory Visit | Attending: Urology | Admitting: Urology

## 2023-07-26 DIAGNOSIS — N2889 Other specified disorders of kidney and ureter: Secondary | ICD-10-CM | POA: Diagnosis not present

## 2023-07-26 DIAGNOSIS — N200 Calculus of kidney: Secondary | ICD-10-CM

## 2023-07-27 ENCOUNTER — Other Ambulatory Visit: Payer: Self-pay | Admitting: *Deleted

## 2023-07-27 DIAGNOSIS — N200 Calculus of kidney: Secondary | ICD-10-CM

## 2023-08-02 ENCOUNTER — Ambulatory Visit: Payer: Medicare HMO | Admitting: Urology

## 2023-08-02 ENCOUNTER — Other Ambulatory Visit: Payer: Self-pay

## 2023-08-02 DIAGNOSIS — S8991XA Unspecified injury of right lower leg, initial encounter: Secondary | ICD-10-CM | POA: Diagnosis not present

## 2023-08-02 DIAGNOSIS — M25561 Pain in right knee: Secondary | ICD-10-CM | POA: Diagnosis not present

## 2023-08-02 MED ORDER — MELOXICAM 15 MG PO TABS
15.0000 mg | ORAL_TABLET | Freq: Every day | ORAL | 0 refills | Status: DC
  Filled 2023-08-02: qty 14, 14d supply, fill #0

## 2023-08-03 ENCOUNTER — Ambulatory Visit: Payer: Medicare HMO | Admitting: Urology

## 2023-08-03 ENCOUNTER — Encounter: Payer: Self-pay | Admitting: Urology

## 2023-08-03 VITALS — BP 127/74 | HR 84 | Ht 66.0 in | Wt 220.0 lb

## 2023-08-03 DIAGNOSIS — N200 Calculus of kidney: Secondary | ICD-10-CM | POA: Diagnosis not present

## 2023-08-03 NOTE — Progress Notes (Signed)
 I, Mary Chen, acting as a scribe for Mary Altes, MD., have documented all relevant documentation on the behalf of Mary Altes, MD, as directed by Mary Altes, MD while in the presence of Mary Altes, MD.  08/03/2023 12:34 PM   Mary Chen December 28, 1955 [id number]  Referring provider: Sherlene Shams, MD [address] [place],  [place] [address]  Chief Complaint  Patient presents with   Nephrolithiasis   Urologic history: 1.  Bilateral nephrolithiasis Ureteroscopic removal left distal calculus 01/2019 CT with bilateral, nonobstructing renal calculi Stone analysis 100% calcium oxalate monohydrate  HPI: Mary Chen is a 68 y.o. female presents for annual follow-up.  Doing well since last visit No bothersome LUTS Denies dysuria, gross hematuria Denies flank, abdominal or pelvic pain   PMH: Past Medical History:  Diagnosis Date   Complication of anesthesia    Fibrocystic disease of breast    History of kidney stones    Hx pulmonary embolism 03/2008   postoperative, negaive Factor V Leuiden, neg LE ultrasound.    Kidney stone    Ovarian cyst    PONV (postoperative nausea and vomiting)     Surgical History: Past Surgical History:  Procedure Laterality Date   ABDOMINAL HYSTERECTOMY     BREAST BIOPSY Left    neg   BREAST EXCISIONAL BIOPSY Left    neg   CESAREAN SECTION     x2   CYSTOSCOPY WITH STENT PLACEMENT Left 12/03/2018   Procedure: CYSTOSCOPY WITH STENT PLACEMENT;  Surgeon: Mary Altes, MD;  Location: [facility] ORS;  Service: Urology;  Laterality: Left;   CYSTOSCOPY/URETEROSCOPY/HOLMIUM LASER/STENT PLACEMENT Left 01/23/2019   Procedure: CYSTOSCOPY/URETEROSCOPY//STENT EXCHANGE;  Surgeon: Mary Altes, MD;  Location: [facility] ORS;  Service: Urology;  Laterality: Left;   STONE EXTRACTION WITH BASKET  01/23/2019   Procedure: STONE EXTRACTION WITH BASKET;  Surgeon: Mary Altes, MD;  Location: [facility] OR[facility];  Service:  Urology;;   TOTAL ABDOMINAL HYSTERECTOMY W/ BILATERAL SALPINGOOPHORECTOMY  2009   for bilateral ovarian cysts and fibroid uterus    Home Medications:  Allergies as of 08/03/2023   No Known Allergies      Medication List        Accurate as of August 03, 2023 12:34 PM. If you have any questions, ask your nurse or doctor.          meloxicam 15 MG tablet Commonly known as: MOBIC Take 1 tablet (15 mg total) by mouth daily for 14 days.   rosuvastatin 10 MG tablet Commonly known as: CRESTOR TAKE 1 TABLET BY MOUTH TWICE A WEEK   telmisartan 20 MG tablet Commonly known as: MICARDIS Take 1 tablet (20 mg total) by mouth at bedtime.   tirzepatide 5 MG/0.5ML Pen Commonly known as: MOUNJARO Inject 5 mg into the skin once a week.   Vitamin D (Ergocalciferol) 1.25 MG (50000 UNIT) Caps capsule Commonly known as: DRISDOL Take 1 capsule (50,000 Units total) by mouth every 7 (seven) days.        Allergies: No Known Allergies  Family History: Family History  Problem Relation Age of Onset   Cancer Mother        breast   Breast cancer Mother 46   Cancer Father 2       lung Ca mets to liver    Bipolar disorder Brother        self inficted GSW    Breast cancer Daughter     Social  History:  reports that she has never smoked. She has never used smokeless tobacco. She reports that she does not drink alcohol and does not use drugs.   Physical Exam: BP 127/74   Pulse 84   Ht 5\' 6"  (1.676 m)   Wt 220 lb (99.8 kg)   BMI 35.51 kg/m   Constitutional:  Alert and oriented, No acute distress. HEENT: [place] AT Respiratory: Normal respiratory effort, no increased work of breathing. GI: Abdomen is soft, nontender, nondistended, no abdominal masses Psychiatric: Normal mood and affect.   Pertinent Imaging: Images of a KUB performed 07/30/2022 were personally reviewed and interpreted.  There are bilateral calcifications overlying the renal outlines which are stable, largest on the right  measuring 3 mm and lower pole calculus on the left measuring 5 mm   Assessment & Plan:    1. Bilateral nephrolithiasis Asymptomatic Will moved to Q2 year follow-up with KUB; called earlier for development of flank/abdominal pain.  I have reviewed the above documentation for accuracy and completeness, and I agree with the above.   Mary Altes, MD  [facility] [address], [address] [place], [place] [address] [phone]

## 2023-08-04 DIAGNOSIS — S83281A Other tear of lateral meniscus, current injury, right knee, initial encounter: Secondary | ICD-10-CM | POA: Diagnosis not present

## 2023-08-04 DIAGNOSIS — M25561 Pain in right knee: Secondary | ICD-10-CM | POA: Diagnosis not present

## 2023-08-04 NOTE — Progress Notes (Signed)
 Chief Complaint: Chief Complaint  Patient presents with  . Right Knee - Pain    DOI 08/01/23    Mary Chen is a 68 y.o. female who presents today for evaluation of acute onset right knee pain.  History of Present Illness Mary Chen is a 68 year old female who presents with knee pain following an exercise-related injury.  On August 01, 2023, she experienced knee pain after performing a plate jump exercise with a 45-pound weight, landing incorrectly. There was no knee buckling or popping sensation at the time of the injury.  The patient felt immediate pain along the lateral aspect the knee.  She was evaluated in the walk-in clinic the next day.  X-rays from a walk-in clinic visit on August 02, 2023, showed no bony injury. She was provided with a knee brace and informed of a potential soft tissue injury. Her pain has since improved significantly, now rated at 1 out of 10, primarily occurring after sitting for a while and then moving around. The pain is no longer localized to the outside of the knee and is described as a mild aching discomfort. She is currently taking Tylenol  and meloxicam  for inflammation. Swelling in the knee has decreased. No numbness or tingling in the leg. She has resumed normal activities, including exercises such as heel raises and toe taps, and has been careful not to let the knee remain inactive.  Denies any surgical history to her right knee.  Denies any previous history of injury in the right knee in the past   Past Medical History: No past medical history on file.  Past Surgical History: Past Surgical History:  Procedure Laterality Date  . CESAREAN SECTION    . HYSTERECTOMY      Past Family History: History reviewed. No pertinent family history.  Medications: Current Outpatient Medications  Medication Sig Dispense Refill  . ergocalciferol , vitamin D2, 1,250 mcg (50,000 unit) capsule Take 50,000 Units by mouth every 7 (seven) days    . meloxicam   (MOBIC ) 15 MG tablet Take 1 tablet (15 mg total) by mouth once daily for 14 days 14 tablet 0  . rosuvastatin  (CRESTOR ) 10 MG tablet Take 1 tablet by mouth twice a week    . telmisartan  (MICARDIS ) 20 MG tablet Take 20 mg by mouth at bedtime     No current facility-administered medications for this visit.    Allergies: No Known Allergies   Review of Systems:  A comprehensive 14 point ROS was performed, reviewed by me today, and the pertinent orthopaedic findings are documented in the HPI.   Exam: BP 130/72   Ht 167.6 cm (5' 6)   Wt (!) 103.3 kg (227 lb 12.8 oz)   BMI 36.77 kg/m  General/Constitutional: The patient appears to be well-nourished, well-developed, and in no acute distress. Neuro/Psych: Normal mood and affect, oriented to person, place and time. Eyes: Non-icteric.  Pupils are equal, round, and reactive to light, and exhibit synchronous movement. ENT: Unremarkable. Lymphatic: No palpable adenopathy. Respiratory: Non-labored breathing Cardiovascular: No edema, swelling or tenderness, except as noted in detailed exam. Integumentary: No impressive skin lesions present, except as noted in detailed exam. Musculoskeletal: Unremarkable, except as noted in detailed exam.  General: Well developed, well nourished 68 y.o. female in no apparent distress.  Normal affect.  Normal communication.  Patient answers questions appropriately.  The patient has a normal gait.  There is no antalgic component.  There is no hip lurch.    Right Lower Extremities: Examination of the  right lower extremity reveals no bony abnormality, no edema, no effusion and no ecchymosis.  There is a mild valgus abnormality.  The patient is non-tender along the lateral joint line, and is non-tender along the medial joint line.  The patient has full knee flexion and extension. There is no discomfort with range of motion exercises.  The patient has a negative rotational Mcmurray test.  There is no retropatellar  discomfort.  The patient has a negative patella stretch test.  The patient has a negative varus stress test and a negative valgus stress test, in looking for stability.  The patient has a negative Lachman's test.  Vascular: The patient has a negative Toula' test bilaterally.  The patient had a normal dorsalis pedis and posterior tibial pulse.  There is normal skin warmth.  There is normal capillary refill bilaterally.    Neurologic: The patient has a negative straight leg raise.  The patient has normal muscle strength testing for the quadriceps, calves, ankle dorsiflexion, ankle plantarflexion, and extensor hallicus longus.  The patient has sensation that is intact to light touch.  The deep tendon reflexes are normal at the patella and achilles.  No clonus is noted.    Imaging: X-rays from the walk-in clinic are detailed in the HPI above.  Impression: Acute pain of right knee [M25.561] Acute pain of right knee  (primary encounter diagnosis) Tear of lateral meniscus of right knee, current, unspecified tear type, initial encounter  Plan:  Assessment & Plan Acute knee pain Acute knee pain following a twisting injury during exercise on August 01, 2023. Initial pain along the lateral knee has improved to 1/10 with minimal discomfort on movement. No history of knee surgery or trauma. X-rays showed no bony injury, suggesting possible lateral meniscus injury. Examination reveals reduced swelling and no significant pain on palpation or movement, lowering suspicion for meniscus injury. Possible soft tissue irritation or minor arthritic changes considered. Given improvement, meniscus injury is less likely, focusing on recovery and monitoring for symptom recurrence. - Continue knee brace for one more week for support. - Complete 14-day course of meloxicam  to reduce inflammation. - Engage in normal activities as tolerated, ensuring continued improvement. - Monitor for recurrence of pain, mechanical  symptoms, or increased swelling. - Contact provider if symptoms worsen or new symptoms develop, such as clicking or catching in the knee. - Consider MRI if symptoms persist or worsen to evaluate for potential meniscus injury.   This office visit took 30 minutes, of which >50% involved patient counseling/education.  Review of the [place] CSRS was performed in accordance of the [facility] prior to dispensing any controlled drugs.  This note was generated in part with voice recognition software and I apologize for any typographical errors that were not detected and corrected.  This note has been created using automated tools and reviewed for accuracy by [facility].   DOROTHA Gustavo Level, PA-C, CAQ-OS [facility]

## 2023-09-01 ENCOUNTER — Other Ambulatory Visit: Payer: Self-pay | Admitting: Internal Medicine

## 2023-09-06 ENCOUNTER — Ambulatory Visit
Admission: RE | Admit: 2023-09-06 | Discharge: 2023-09-06 | Disposition: A | Discharge status: Home / Self Care | Source: Ambulatory Visit | Attending: Internal Medicine | Admitting: Internal Medicine

## 2023-09-06 DIAGNOSIS — Z1231 Encounter for screening mammogram for malignant neoplasm of breast: Secondary | ICD-10-CM

## 2023-09-06 DIAGNOSIS — Z780 Asymptomatic menopausal state: Secondary | ICD-10-CM | POA: Insufficient documentation

## 2023-09-08 ENCOUNTER — Encounter: Payer: Self-pay | Admitting: Internal Medicine

## 2023-09-28 IMAGING — MG MM DIGITAL SCREENING BILAT W/ TOMO AND CAD
8 series · 8 of 24 positions shown · non-contrast
Comparison: Previous exam(s).

CLINICAL DATA: Screening.

EXAM:
DIGITAL SCREENING BILATERAL MAMMOGRAM WITH TOMOSYNTHESIS AND CAD
TECHNIQUE: Bilateral screening digital craniocaudal and mediolateral oblique
mammograms were obtained. Bilateral screening digital breast
tomosynthesis was performed. The images were evaluated with
computer-aided detection.

[L CC synth-2D]
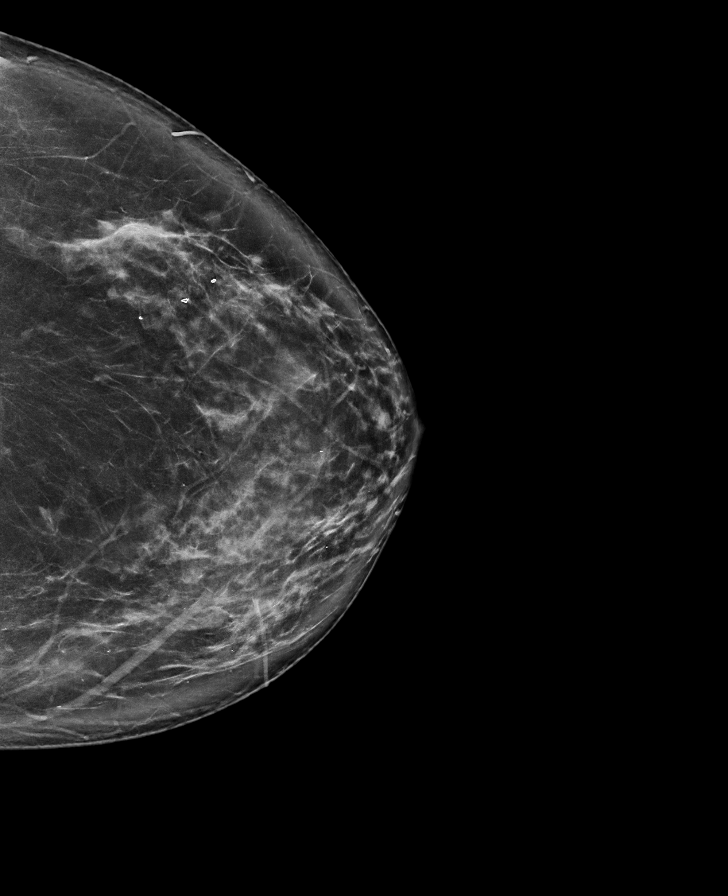

[R CC synth-2D]
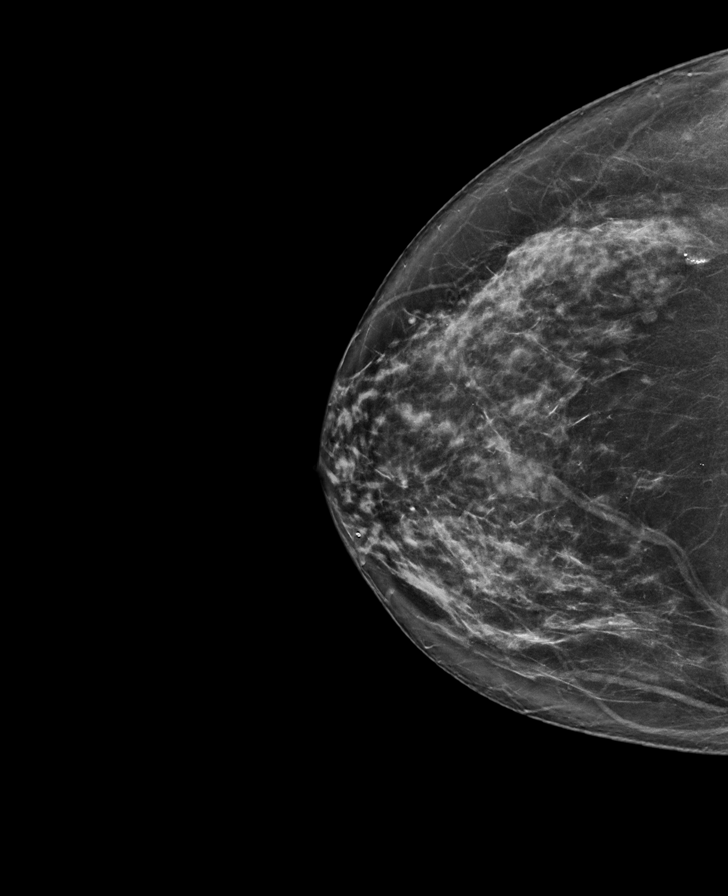

[R MLO synth-2D]
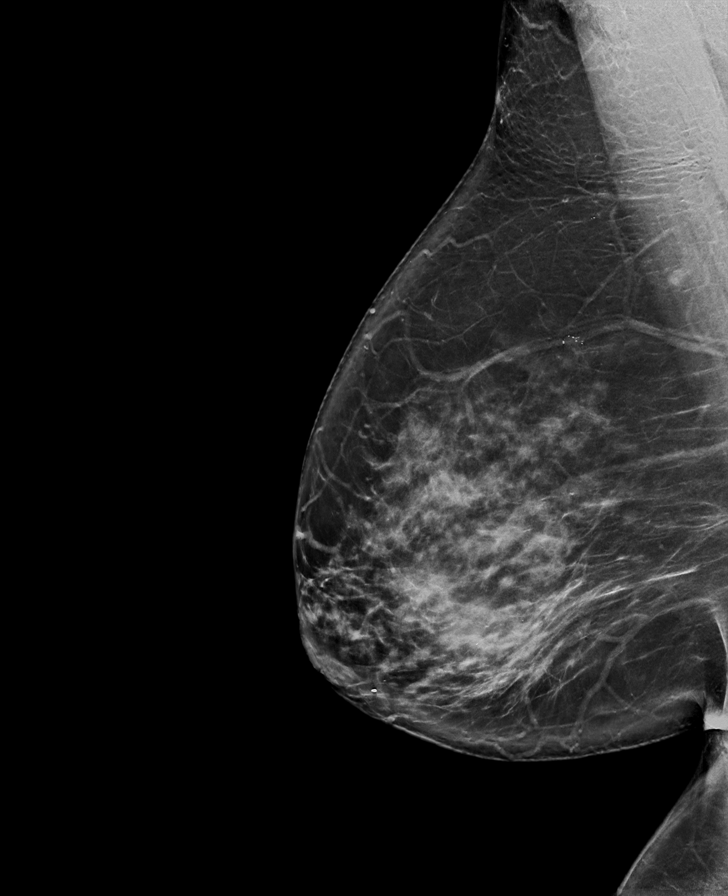

[L MLO synth-2D]
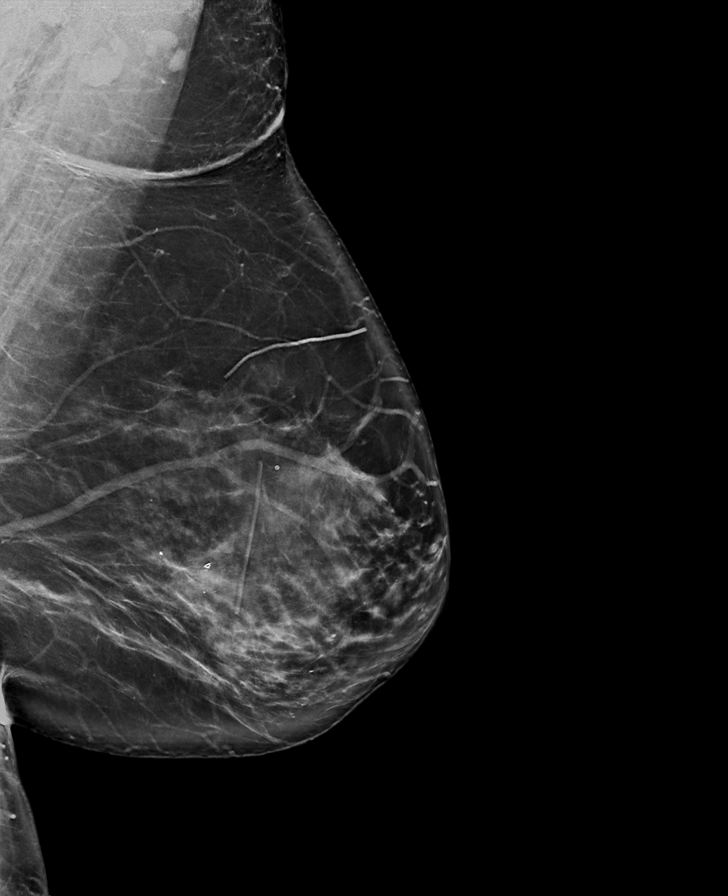

[R MLO tomo · tomo slice 45/90.0]
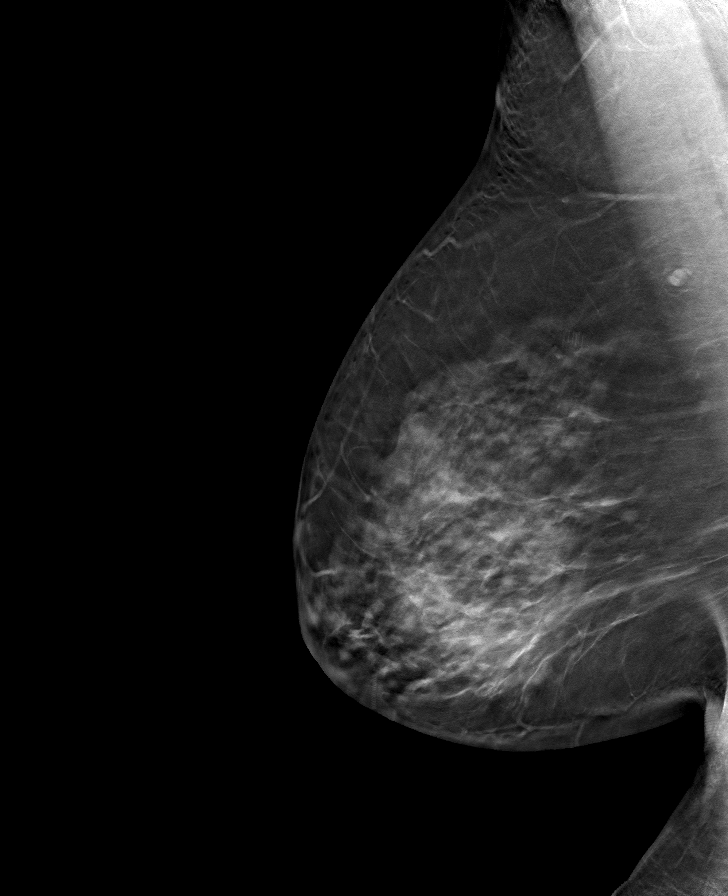

[L MLO tomo · tomo slice 47/94.0]
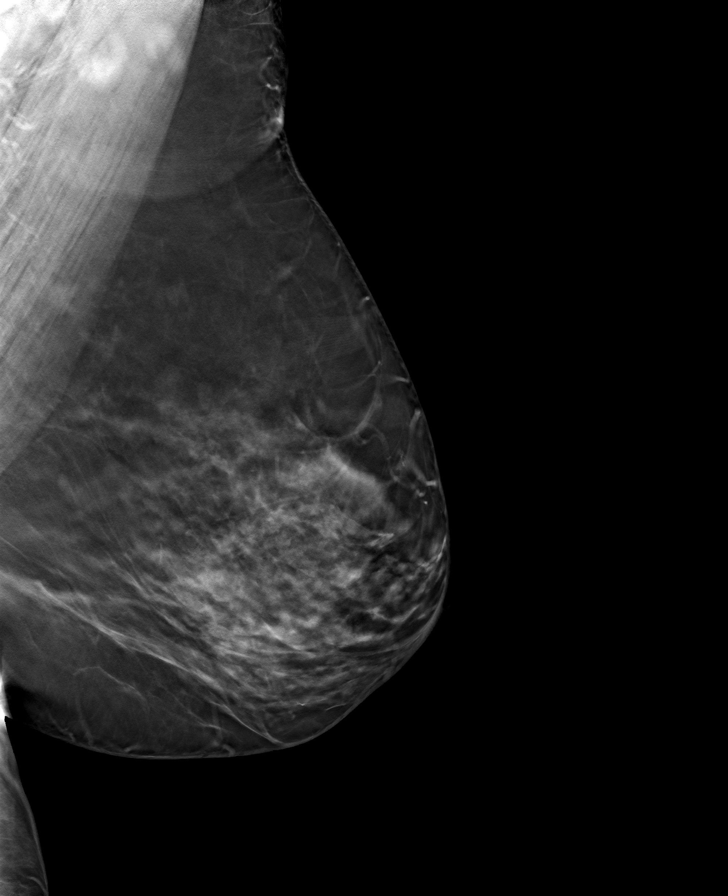

[L CC tomo · tomo slice 45/89.0]
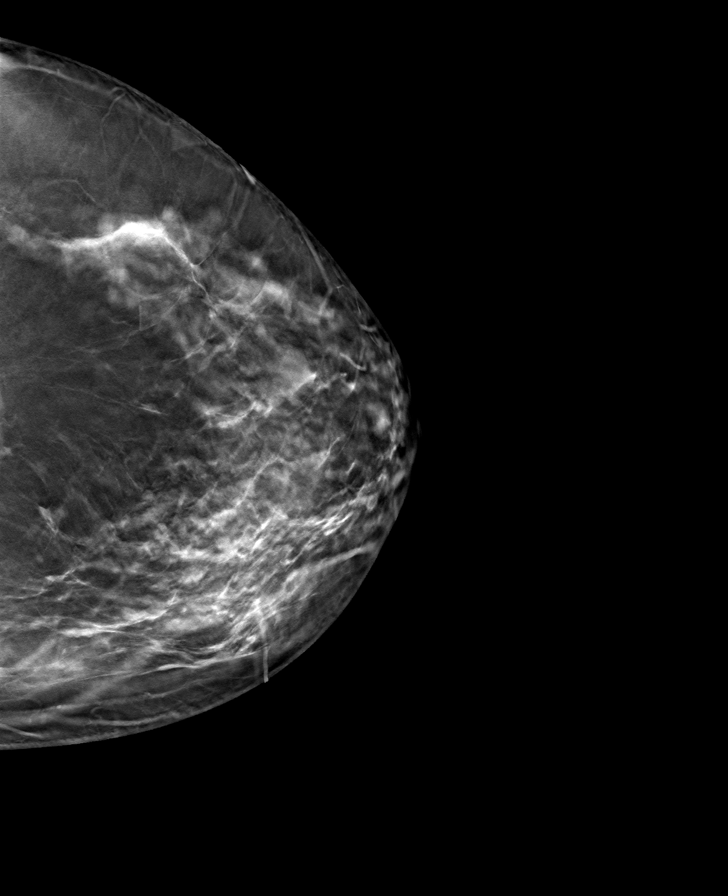

[R CC tomo · tomo slice 41/81.0]
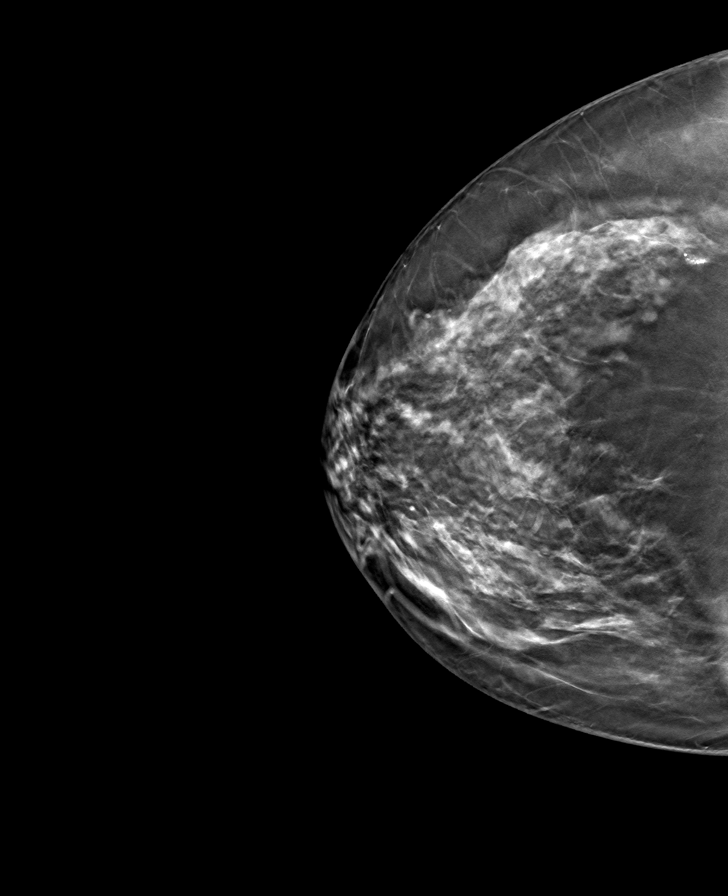

[8 of 24 positions shown; findings below may reference images not displayed]

ACR Breast Density Category c: The breast tissue is heterogeneously
dense, which may obscure small masses.
FINDINGS: There are no findings suspicious for malignancy.
IMPRESSION: No mammographic evidence of malignancy. A result letter of this
screening mammogram will be mailed directly to the patient.

RECOMMENDATION:
Screening mammogram in one year. (Code:Q3-W-BC3)

BI-RADS CATEGORY  1: Negative.

## 2023-11-20 ENCOUNTER — Encounter
Admission: EM | Disposition: A | Discharge status: Home / Self Care | Payer: Self-pay | Source: Home / Self Care | Attending: Student

## 2023-11-20 ENCOUNTER — Observation Stay: Admitting: Certified Registered Nurse Anesthetist

## 2023-11-20 ENCOUNTER — Emergency Department

## 2023-11-20 ENCOUNTER — Inpatient Hospital Stay
Admission: EM | Admit: 2023-11-20 | Discharge: 2023-11-23 | DRG: 660 | Disposition: A | Discharge status: Home / Self Care | Attending: Student | Admitting: Student

## 2023-11-20 ENCOUNTER — Observation Stay

## 2023-11-20 ENCOUNTER — Other Ambulatory Visit: Payer: Self-pay

## 2023-11-20 DIAGNOSIS — Z803 Family history of malignant neoplasm of breast: Secondary | ICD-10-CM

## 2023-11-20 DIAGNOSIS — R8281 Pyuria: Secondary | ICD-10-CM

## 2023-11-20 DIAGNOSIS — Z6832 Body mass index (BMI) 32.0-32.9, adult: Secondary | ICD-10-CM

## 2023-11-20 DIAGNOSIS — Z818 Family history of other mental and behavioral disorders: Secondary | ICD-10-CM

## 2023-11-20 DIAGNOSIS — D696 Thrombocytopenia, unspecified: Secondary | ICD-10-CM | POA: Diagnosis present

## 2023-11-20 DIAGNOSIS — R509 Fever, unspecified: Secondary | ICD-10-CM

## 2023-11-20 DIAGNOSIS — N136 Pyonephrosis: Principal | ICD-10-CM | POA: Diagnosis present

## 2023-11-20 DIAGNOSIS — N390 Urinary tract infection, site not specified: Secondary | ICD-10-CM

## 2023-11-20 DIAGNOSIS — Z1152 Encounter for screening for COVID-19: Secondary | ICD-10-CM

## 2023-11-20 DIAGNOSIS — I959 Hypotension, unspecified: Secondary | ICD-10-CM | POA: Diagnosis present

## 2023-11-20 DIAGNOSIS — I10 Essential (primary) hypertension: Secondary | ICD-10-CM | POA: Insufficient documentation

## 2023-11-20 DIAGNOSIS — Z90722 Acquired absence of ovaries, bilateral: Secondary | ICD-10-CM

## 2023-11-20 DIAGNOSIS — Z791 Long term (current) use of non-steroidal anti-inflammatories (NSAID): Secondary | ICD-10-CM

## 2023-11-20 DIAGNOSIS — E6609 Other obesity due to excess calories: Secondary | ICD-10-CM

## 2023-11-20 DIAGNOSIS — N179 Acute kidney failure, unspecified: Secondary | ICD-10-CM | POA: Diagnosis present

## 2023-11-20 DIAGNOSIS — N139 Obstructive and reflux uropathy, unspecified: Secondary | ICD-10-CM | POA: Diagnosis not present

## 2023-11-20 DIAGNOSIS — N132 Hydronephrosis with renal and ureteral calculous obstruction: Secondary | ICD-10-CM | POA: Diagnosis not present

## 2023-11-20 DIAGNOSIS — N134 Hydroureter: Secondary | ICD-10-CM | POA: Diagnosis not present

## 2023-11-20 DIAGNOSIS — E1169 Type 2 diabetes mellitus with other specified complication: Secondary | ICD-10-CM | POA: Diagnosis present

## 2023-11-20 DIAGNOSIS — N201 Calculus of ureter: Secondary | ICD-10-CM | POA: Diagnosis not present

## 2023-11-20 DIAGNOSIS — K8020 Calculus of gallbladder without cholecystitis without obstruction: Secondary | ICD-10-CM | POA: Diagnosis present

## 2023-11-20 DIAGNOSIS — Z86711 Personal history of pulmonary embolism: Secondary | ICD-10-CM

## 2023-11-20 DIAGNOSIS — Z79899 Other long term (current) drug therapy: Secondary | ICD-10-CM

## 2023-11-20 DIAGNOSIS — E639 Nutritional deficiency, unspecified: Secondary | ICD-10-CM | POA: Diagnosis present

## 2023-11-20 DIAGNOSIS — N202 Calculus of kidney with calculus of ureter: Secondary | ICD-10-CM | POA: Diagnosis present

## 2023-11-20 DIAGNOSIS — R109 Unspecified abdominal pain: Secondary | ICD-10-CM

## 2023-11-20 DIAGNOSIS — N23 Unspecified renal colic: Secondary | ICD-10-CM | POA: Diagnosis not present

## 2023-11-20 DIAGNOSIS — E785 Hyperlipidemia, unspecified: Secondary | ICD-10-CM | POA: Diagnosis present

## 2023-11-20 DIAGNOSIS — K449 Diaphragmatic hernia without obstruction or gangrene: Secondary | ICD-10-CM | POA: Diagnosis not present

## 2023-11-20 DIAGNOSIS — B9620 Unspecified Escherichia coli [E. coli] as the cause of diseases classified elsewhere: Secondary | ICD-10-CM | POA: Diagnosis present

## 2023-11-20 DIAGNOSIS — Z7985 Long-term (current) use of injectable non-insulin antidiabetic drugs: Secondary | ICD-10-CM

## 2023-11-20 DIAGNOSIS — E66812 Obesity, class 2: Secondary | ICD-10-CM | POA: Diagnosis present

## 2023-11-20 DIAGNOSIS — Z801 Family history of malignant neoplasm of trachea, bronchus and lung: Secondary | ICD-10-CM

## 2023-11-20 DIAGNOSIS — N209 Urinary calculus, unspecified: Secondary | ICD-10-CM | POA: Insufficient documentation

## 2023-11-20 DIAGNOSIS — Z90710 Acquired absence of both cervix and uterus: Secondary | ICD-10-CM

## 2023-11-20 HISTORY — PX: CYSTOSCOPY W/ URETERAL STENT PLACEMENT: SHX1429

## 2023-11-20 LAB — URINALYSIS, ROUTINE W REFLEX MICROSCOPIC
Bilirubin Urine: NEGATIVE
Glucose, UA: NEGATIVE mg/dL
Ketones, ur: NEGATIVE mg/dL
Nitrite: POSITIVE — AB
Protein, ur: 30 mg/dL — AB
Specific Gravity, Urine: 1.013 (ref 1.005–1.030)
pH: 5 (ref 5.0–8.0)

## 2023-11-20 LAB — HEPATIC FUNCTION PANEL
ALT: 19 U/L (ref 0–44)
AST: 36 U/L (ref 15–41)
Albumin: 3.6 g/dL (ref 3.5–5.0)
Alkaline Phosphatase: 72 U/L (ref 38–126)
Bilirubin, Direct: 0.3 mg/dL — ABNORMAL HIGH (ref 0.0–0.2)
Indirect Bilirubin: 1.5 mg/dL — ABNORMAL HIGH (ref 0.3–0.9)
Total Bilirubin: 1.8 mg/dL — ABNORMAL HIGH (ref 0.0–1.2)
Total Protein: 6.4 g/dL — ABNORMAL LOW (ref 6.5–8.1)

## 2023-11-20 LAB — CBC
HCT: 40.5 % (ref 36.0–46.0)
Hemoglobin: 12.9 g/dL (ref 12.0–15.0)
MCH: 22.4 pg — ABNORMAL LOW (ref 26.0–34.0)
MCHC: 31.9 g/dL (ref 30.0–36.0)
MCV: 70.2 fL — ABNORMAL LOW (ref 80.0–100.0)
Platelets: 133 K/uL — ABNORMAL LOW (ref 150–400)
RBC: 5.77 MIL/uL — ABNORMAL HIGH (ref 3.87–5.11)
RDW: 17.2 % — ABNORMAL HIGH (ref 11.5–15.5)
WBC: 5.3 K/uL (ref 4.0–10.5)
nRBC: 0.6 % — ABNORMAL HIGH (ref 0.0–0.2)

## 2023-11-20 LAB — BASIC METABOLIC PANEL WITH GFR
Anion gap: 11 (ref 5–15)
BUN: 25 mg/dL — ABNORMAL HIGH (ref 8–23)
CO2: 21 mmol/L — ABNORMAL LOW (ref 22–32)
Calcium: 8.8 mg/dL — ABNORMAL LOW (ref 8.9–10.3)
Chloride: 108 mmol/L (ref 98–111)
Creatinine, Ser: 1.14 mg/dL — ABNORMAL HIGH (ref 0.44–1.00)
GFR, Estimated: 52 mL/min — ABNORMAL LOW (ref >60)
Glucose, Bld: 140 mg/dL — ABNORMAL HIGH (ref 70–99)
Potassium: 3.5 mmol/L (ref 3.5–5.1)
Sodium: 140 mmol/L (ref 135–145)

## 2023-11-20 LAB — RESP PANEL BY RT-PCR (RSV, FLU A&B, COVID)  RVPGX2
Influenza A by PCR: NEGATIVE
Influenza B by PCR: NEGATIVE
Resp Syncytial Virus by PCR: NEGATIVE
SARS Coronavirus 2 by RT PCR: NEGATIVE

## 2023-11-20 LAB — FOLATE: Folate: 9 ng/mL (ref >5.9)

## 2023-11-20 LAB — IRON AND TIBC
Iron: 65 ug/dL (ref 28–170)
Saturation Ratios: 22 % (ref 10.4–31.8)
TIBC: 290 ug/dL (ref 250–450)
UIBC: 225 ug/dL

## 2023-11-20 LAB — LIPASE, BLOOD: Lipase: 30 U/L (ref 11–51)

## 2023-11-20 LAB — VITAMIN B12: Vitamin B-12: 196 pg/mL (ref 180–914)

## 2023-11-20 LAB — PHOSPHORUS: Phosphorus: 1.4 mg/dL — ABNORMAL LOW (ref 2.5–4.6)

## 2023-11-20 LAB — GLUCOSE, CAPILLARY: Glucose-Capillary: 114 mg/dL — ABNORMAL HIGH (ref 70–99)

## 2023-11-20 SURGERY — CYSTOSCOPY, WITH RETROGRADE PYELOGRAM AND URETERAL STENT INSERTION
Anesthesia: General | Site: Ureter | Laterality: Left

## 2023-11-20 MED ORDER — SODIUM CHLORIDE 0.9 % IV BOLUS
1000.0000 mL | Freq: Once | INTRAVENOUS | Status: AC
  Administered 2023-11-20: 1000 mL via INTRAVENOUS

## 2023-11-20 MED ORDER — SCOPOLAMINE 1 MG/3DAYS TD PT72
MEDICATED_PATCH | TRANSDERMAL | Status: AC
  Filled 2023-11-20: qty 1

## 2023-11-20 MED ORDER — HYOSCYAMINE SULFATE 0.125 MG PO TBDP: 0.1250 mg | ORAL_TABLET | Freq: Four times a day (QID) | ORAL | Status: DC | PRN

## 2023-11-20 MED ORDER — DEXAMETHASONE SODIUM PHOSPHATE 10 MG/ML IJ SOLN
INTRAMUSCULAR | Status: DC | PRN
  Administered 2023-11-20: 10 mg via INTRAVENOUS

## 2023-11-20 MED ORDER — ONDANSETRON HCL 4 MG PO TABS: 4.0000 mg | ORAL_TABLET | Freq: Four times a day (QID) | ORAL | Status: DC | PRN

## 2023-11-20 MED ORDER — ACETAMINOPHEN 10 MG/ML IV SOLN
INTRAVENOUS | Status: AC
  Filled 2023-11-20: qty 100

## 2023-11-20 MED ORDER — ACETAMINOPHEN 325 MG PO TABS
650.0000 mg | ORAL_TABLET | Freq: Four times a day (QID) | ORAL | Status: DC | PRN
Start: 2023-11-20 — End: 2023-11-23
  Administered 2023-11-22: 650 mg via ORAL
  Filled 2023-11-20: qty 2

## 2023-11-20 MED ORDER — OXYCODONE HCL 5 MG PO TABS: 5.0000 mg | ORAL_TABLET | Freq: Once | ORAL | Status: DC | PRN

## 2023-11-20 MED ORDER — KETOROLAC TROMETHAMINE 15 MG/ML IJ SOLN
15.0000 mg | Freq: Once | INTRAMUSCULAR | Status: AC
  Administered 2023-11-20: 15 mg via INTRAVENOUS
  Filled 2023-11-20: qty 1

## 2023-11-20 MED ORDER — LACTATED RINGERS IV SOLN: INTRAVENOUS | Status: DC | PRN

## 2023-11-20 MED ORDER — SODIUM CHLORIDE 0.9 % IR SOLN
Status: DC | PRN
  Administered 2023-11-20: 3000 mL

## 2023-11-20 MED ORDER — ACETAMINOPHEN 650 MG RE SUPP
650.0000 mg | Freq: Four times a day (QID) | RECTAL | Status: DC | PRN
Start: 2023-11-20 — End: 2023-11-23

## 2023-11-20 MED ORDER — ONDANSETRON HCL 4 MG/2ML IJ SOLN: 4.0000 mg | Freq: Four times a day (QID) | INTRAMUSCULAR | Status: DC | PRN

## 2023-11-20 MED ORDER — SODIUM CHLORIDE 0.9 % IV SOLN: INTRAVENOUS | Status: DC

## 2023-11-20 MED ORDER — HYDROCODONE-ACETAMINOPHEN 5-325 MG PO TABS: 1.0000 | ORAL_TABLET | ORAL | Status: DC | PRN

## 2023-11-20 MED ORDER — IOHEXOL 180 MG/ML  SOLN
INTRAMUSCULAR | Status: DC | PRN
Start: 2023-11-20 — End: 2023-11-20
  Administered 2023-11-20: 10 mL

## 2023-11-20 MED ORDER — PROPOFOL 10 MG/ML IV BOLUS
INTRAVENOUS | Status: DC | PRN
  Administered 2023-11-20: 150 mg via INTRAVENOUS

## 2023-11-20 MED ORDER — PHENYLEPHRINE 80 MCG/ML (10ML) SYRINGE FOR IV PUSH (FOR BLOOD PRESSURE SUPPORT)
PREFILLED_SYRINGE | INTRAVENOUS | Status: DC | PRN
  Administered 2023-11-20: 160 ug via INTRAVENOUS
  Administered 2023-11-20: 240 ug via INTRAVENOUS
  Administered 2023-11-20: 160 ug via INTRAVENOUS
  Administered 2023-11-20: 240 ug via INTRAVENOUS

## 2023-11-20 MED ORDER — TAMSULOSIN HCL 0.4 MG PO CAPS
0.4000 mg | ORAL_CAPSULE | Freq: Every day | ORAL | Status: DC
  Administered 2023-11-20 – 2023-11-23 (×4): 0.4 mg via ORAL
  Filled 2023-11-20 (×4): qty 1

## 2023-11-20 MED ORDER — ONDANSETRON HCL 4 MG/2ML IJ SOLN
4.0000 mg | Freq: Once | INTRAMUSCULAR | Status: AC
  Administered 2023-11-20: 4 mg via INTRAVENOUS
  Filled 2023-11-20: qty 2

## 2023-11-20 MED ORDER — MELATONIN 5 MG PO TABS: 5.0000 mg | ORAL_TABLET | Freq: Every evening | ORAL | Status: DC | PRN

## 2023-11-20 MED ORDER — LACTATED RINGERS IV SOLN: INTRAVENOUS | Status: DC

## 2023-11-20 MED ORDER — FENTANYL CITRATE (PF) 100 MCG/2ML IJ SOLN
INTRAMUSCULAR | Status: DC | PRN
  Administered 2023-11-20 (×2): 25 ug via INTRAVENOUS

## 2023-11-20 MED ORDER — SCOPOLAMINE 1 MG/3DAYS TD PT72
1.0000 | MEDICATED_PATCH | TRANSDERMAL | Status: DC
  Administered 2023-11-20: 1.5 mg via TRANSDERMAL

## 2023-11-20 MED ORDER — ONDANSETRON HCL 4 MG/2ML IJ SOLN
INTRAMUSCULAR | Status: DC | PRN
  Administered 2023-11-20: 4 mg via INTRAVENOUS

## 2023-11-20 MED ORDER — MIDAZOLAM HCL 2 MG/2ML IJ SOLN
INTRAMUSCULAR | Status: DC | PRN
  Administered 2023-11-20 (×2): 1 mg via INTRAVENOUS

## 2023-11-20 MED ORDER — EPHEDRINE SULFATE-NACL 50-0.9 MG/10ML-% IV SOSY
PREFILLED_SYRINGE | INTRAVENOUS | Status: DC | PRN
  Administered 2023-11-20: 10 mg via INTRAVENOUS
  Administered 2023-11-20: 5 mg via INTRAVENOUS
  Administered 2023-11-20: 10 mg via INTRAVENOUS

## 2023-11-20 MED ORDER — CEFTRIAXONE SODIUM 1 G IJ SOLR
1.0000 g | INTRAMUSCULAR | Status: DC
  Administered 2023-11-20 – 2023-11-23 (×4): 1 g via INTRAVENOUS
  Filled 2023-11-20 (×4): qty 10

## 2023-11-20 MED ORDER — FENTANYL CITRATE (PF) 100 MCG/2ML IJ SOLN: 25.0000 ug | INTRAMUSCULAR | Status: DC | PRN

## 2023-11-20 MED ORDER — MORPHINE SULFATE (PF) 2 MG/ML IV SOLN
1.0000 mg | INTRAVENOUS | Status: DC | PRN
  Administered 2023-11-20: 1 mg via INTRAVENOUS
  Filled 2023-11-20: qty 1

## 2023-11-20 MED ORDER — ACETAMINOPHEN 10 MG/ML IV SOLN
1000.0000 mg | Freq: Once | INTRAVENOUS | Status: DC | PRN
  Administered 2023-11-20: 1000 mg via INTRAVENOUS

## 2023-11-20 MED ORDER — POLYETHYLENE GLYCOL 3350 17 G PO PACK: 17.0000 g | PACK | Freq: Every day | ORAL | Status: DC | PRN

## 2023-11-20 MED ORDER — OXYCODONE HCL 5 MG/5ML PO SOLN: 5.0000 mg | Freq: Once | ORAL | Status: DC | PRN

## 2023-11-20 MED ORDER — LIDOCAINE HCL (CARDIAC) PF 100 MG/5ML IV SOSY
PREFILLED_SYRINGE | INTRAVENOUS | Status: DC | PRN
  Administered 2023-11-20: 60 mg via INTRAVENOUS

## 2023-11-20 SURGICAL SUPPLY — 16 items
BRUSH SCRUB EZ 4% CHG (MISCELLANEOUS) ×1 IMPLANT
CATH URETL OPEN END 6X70 (CATHETERS) ×1 IMPLANT
CNTNR URN SCR LID CUP LEK RST (MISCELLANEOUS) IMPLANT
GLOVE BIOGEL PI IND STRL 7.5 (GLOVE) ×1 IMPLANT
GOWN STRL REUS W/ TWL LRG LVL3 (GOWN DISPOSABLE) ×1 IMPLANT
GOWN STRL REUS W/ TWL XL LVL3 (GOWN DISPOSABLE) ×1 IMPLANT
GUIDEWIRE STR DUAL SENSOR (WIRE) ×1 IMPLANT
KIT TURNOVER CYSTO (KITS) ×1 IMPLANT
PACK CYSTO AR (MISCELLANEOUS) ×1 IMPLANT
SET CYSTO W/LG BORE CLAMP LF (SET/KITS/TRAYS/PACK) ×1 IMPLANT
SOL .9 NS 3000ML IRR UROMATIC (IV SOLUTION) ×1 IMPLANT
STENT URET 6FRX24 CONTOUR (STENTS) IMPLANT
STENT URET 6FRX26 CONTOUR (STENTS) IMPLANT
SURGILUBE 2OZ TUBE FLIPTOP (MISCELLANEOUS) ×1 IMPLANT
SYR 10ML LL (SYRINGE) ×1 IMPLANT
WATER STERILE IRR 500ML POUR (IV SOLUTION) ×1 IMPLANT

## 2023-11-20 NOTE — Transfer of Care (Signed)
 Immediate Anesthesia Transfer of Care Note  Patient: Mary Chen  Procedure(s) Performed: CYSTOSCOPY, WITH RETROGRADE PYELOGRAM AND URETERAL STENT INSERTION (Left: Ureter)  Patient Location: PACU  Anesthesia Type:General  Level of Consciousness: drowsy  Airway & Oxygen Therapy: Patient Spontanous Breathing and Patient connected to face mask oxygen  Post-op Assessment: Report given to RN and Post -op Vital signs reviewed and stable  Post vital signs: Reviewed and stable  Last Vitals:  Vitals Value Taken Time  BP 96/64 11/20/23 15:57  Temp 36.7 C 11/20/23 15:57  Pulse 115 11/20/23 16:00  Resp 23 11/20/23 16:00  SpO2 100 % 11/20/23 16:00  Vitals shown include unfiled device data.  Last Pain:  Vitals:   11/20/23 1557  TempSrc:   PainSc: Asleep         Complications: No notable events documented.

## 2023-11-20 NOTE — Consult Note (Signed)
 Urology Consult  Requesting provider: Andriette Maltos,  PA-C  Reason for consultation: Left ureteral calculus with infection   Assessment/Recommendations: 68 y.o. female with a left distal ureteral calculus, low-grade fever and nitrite positive urine with pyuria Urine culture has been ordered and she has been started on IV antibiotics Recommend cystoscopy with placement left ureteral stent today. We discussed no attempt will be made to remove the stone due to the high risk of septicemia We discussed the risks at length including bleeding, infection, sepsis, ureteral injury, and stent related symptoms including urgency/frequency/dysuria/flank pain/gross hematuria.  There is a low, but not 0, risk of inability to pass the ureteral stent alongside the stone secondary to an impacted stone which would require percutaneous nephrostomy tube by interventional radiology. We discussed she will need follow-up definitive stone treatment in the next 2-4 weeks   Urologic history: 1.  Bilateral nephrolithiasis Ureteroscopic removal left distal calculus 01/2019 CT with bilateral, nonobstructing renal calculi Stone analysis 100% calcium  oxalate monohydrate   Chief Complaint: Flank pain  History of Present Illness: Mary Chen is a 68 y.o. with prior history of stone disease.  I last saw her 08/03/2023 for an annual visit and KUB at that time with bilateral, nonobstructing renal calculi.  Presented to the ED today with acute onset of left flank pain at 0100 this a.m. Associated with chills, nausea and vomiting Pain radiates to the left lower quadrant Evaluation in ED with urinalysis showing nitrite positive urine with 11-20 RBCs/11-20 WBC.  Temp was 99.  She had no leukocytosis. CT renal stone study showed bilateral nonobstructing renal calculi and a 7 mm left distal ureteral calculus with mild-moderate hydronephrosis, renal edema and perinephric stranding She was started on IV ceftriaxone  and  admitted to the hospitalist service for IV antibiotics   Past Medical History:  Diagnosis Date   Complication of anesthesia    Fibrocystic disease of breast    History of kidney stones    Hx pulmonary embolism 03/2008   postoperative, negaive Factor V Leuiden, neg LE ultrasound.    Kidney stone    Ovarian cyst    PONV (postoperative nausea and vomiting)     Past Surgical History:  Procedure Laterality Date   ABDOMINAL HYSTERECTOMY     BREAST BIOPSY Left    neg   BREAST EXCISIONAL BIOPSY Left    neg   CESAREAN SECTION     x2   CYSTOSCOPY WITH STENT PLACEMENT Left 12/03/2018   Procedure: CYSTOSCOPY WITH STENT PLACEMENT;  Surgeon: Twylla Glendia BROCKS, MD;  Location: [facility];  Service: Urology;  Laterality: Left;   CYSTOSCOPY/URETEROSCOPY/HOLMIUM LASER/STENT PLACEMENT Left 01/23/2019   Procedure: CYSTOSCOPY/URETEROSCOPY//STENT EXCHANGE;  Surgeon: Twylla Glendia BROCKS, MD;  Location: [facility];  Service: Urology;  Laterality: Left;   STONE EXTRACTION WITH BASKET  01/23/2019   Procedure: STONE EXTRACTION WITH BASKET;  Surgeon: Twylla Glendia BROCKS, MD;  Location: [facility];  Service: Urology;;   TOTAL ABDOMINAL HYSTERECTOMY W/ BILATERAL SALPINGOOPHORECTOMY  2009   for bilateral ovarian cysts and fibroid uterus    Home Medications:  No outpatient medications have been marked as taking for the 11/20/23 encounter [facility] Encounter).    Allergies: No Known Allergies  Family History  Problem Relation Age of Onset   Cancer Mother        breast   Breast cancer Mother 48   Cancer Father 68       lung Ca mets to liver    Bipolar disorder Brother  self inficted GSW    Breast cancer Daughter     Social History:  reports that she has never smoked. She has never used smokeless tobacco. She reports that she does not drink alcohol and does not use drugs.  ROS: A complete review of systems was performed.  All systems are negative except for pertinent findings as noted.  Physical Exam:  Vital  signs in last 24 hours: Temp:  [98.8 F (37.1 C)-99 F (37.2 C)] 98.8 F (37.1 C) (07/06 1147) Pulse Rate:  [105-116] 105 (07/06 1147) Resp:  [18] 18 (07/06 1147) BP: (103-123)/(72-77) 103/72 (07/06 1147) SpO2:  [96 %-97 %] 97 % (07/06 1147) Weight:  [90.7 kg] 90.7 kg (07/06 0833) Constitutional:  Alert and oriented, No acute distress HEENT: [place] AT Cardiovascular: Regular rate and rhythm Respiratory: Normal respiratory effort, lungs clear bilaterally GI: Abdomen is soft, LLQ tenderness;nondistended, no abdominal masses GU: Left CVA tenderness Psychiatric: Normal mood and affect   Laboratory Data:  Recent Labs    11/20/23 0844  WBC 5.3  HGB 12.9  HCT 40.5   Recent Labs    11/20/23 0844  NA 140  K 3.5  CL 108  CO2 21*  GLUCOSE 140*  BUN 25*  CREATININE 1.14*  CALCIUM  8.8*   No results for input(s): LABPT, INR in the last 72 hours. No results for input(s): LABURIN in the last 72 hours. Results for orders placed or performed during the hospital encounter of 11/20/23  Resp panel by RT-PCR (RSV, Flu A&B, Covid) Anterior Nasal Swab     Status: None   Collection Time: 11/20/23  8:36 AM   Specimen: Anterior Nasal Swab  Result Value Ref Range Status   SARS Coronavirus 2 by RT PCR NEGATIVE NEGATIVE Final    Comment: (NOTE) SARS-CoV-2 target nucleic acids are NOT DETECTED.  The SARS-CoV-2 RNA is generally detectable in upper respiratory specimens during the acute phase of infection. The lowest concentration of SARS-CoV-2 viral copies this assay can detect is 138 copies/mL. A negative result does not preclude SARS-Cov-2 infection and should not be used as the sole basis for treatment or other patient management decisions. A negative result may occur with  improper specimen collection/handling, submission of specimen other than nasopharyngeal swab, presence of viral mutation(s) within the areas targeted by this assay, and inadequate number of viral copies(<138  copies/mL). A negative result must be combined with clinical observations, patient history, and epidemiological information. The expected result is Negative.  Fact Sheet for Patients:  BloggerCourse.com  Fact Sheet for Healthcare Providers:  SeriousBroker.it  This test is no t yet approved or cleared by the [place]  FDA and  has been authorized for detection and/or diagnosis of SARS-CoV-2 by FDA under an Emergency Use Authorization (EUA). This EUA will remain  in effect (meaning this test can be used) for the duration of the COVID-19 declaration under Section 564(b)(1) of the Act, 21 U.S.C.section 360bbb-3(b)(1), unless the authorization is terminated  or revoked sooner.       Influenza A by PCR NEGATIVE NEGATIVE Final   Influenza B by PCR NEGATIVE NEGATIVE Final    Comment: (NOTE) The Xpert Xpress SARS-CoV-2/FLU/RSV plus assay is intended as an aid in the diagnosis of influenza from Nasopharyngeal swab specimens and should not be used as a sole basis for treatment. Nasal washings and aspirates are unacceptable for Xpert Xpress SARS-CoV-2/FLU/RSV testing.  Fact Sheet for Patients: BloggerCourse.com  Fact Sheet for Healthcare Providers: SeriousBroker.it  This test is not yet approved or  cleared by the [place]  FDA and has been authorized for detection and/or diagnosis of SARS-CoV-2 by FDA under an Emergency Use Authorization (EUA). This EUA will remain in effect (meaning this test can be used) for the duration of the COVID-19 declaration under Section 564(b)(1) of the Act, 21 U.S.C. section 360bbb-3(b)(1), unless the authorization is terminated or revoked.     Resp Syncytial Virus by PCR NEGATIVE NEGATIVE Final    Comment: (NOTE) Fact Sheet for Patients: BloggerCourse.com  Fact Sheet for Healthcare  Providers: SeriousBroker.it  This test is not yet approved or cleared by the [place]  FDA and has been authorized for detection and/or diagnosis of SARS-CoV-2 by FDA under an Emergency Use Authorization (EUA). This EUA will remain in effect (meaning this test can be used) for the duration of the COVID-19 declaration under Section 564(b)(1) of the Act, 21 U.S.C. section 360bbb-3(b)(1), unless the authorization is terminated or revoked.  Performed at [facility], [address], [place], [place] [address]      Radiologic Imaging: CT images were personally reviewed and interpreted  CT Renal Stone Study Result Date: 11/20/2023 CLINICAL DATA:  Abdominal pain and flank pain. EXAM: CT ABDOMEN AND PELVIS WITHOUT CONTRAST TECHNIQUE: Multidetector CT imaging of the abdomen and pelvis was performed following the standard protocol without IV contrast. RADIATION DOSE REDUCTION: This exam was performed according to the departmental dose-optimization program which includes automated exposure control, adjustment of the mA and/or kV according to patient size and/or use of iterative reconstruction technique. COMPARISON:  12/02/2018 FINDINGS: Lower chest: No pleural fluid or consolidative change. Scar versus subsegmental atelectasis within the periphery of the left base. Hepatobiliary: Scattered liver cysts. These appears similar in size and multiplicity to the previous exam. The largest is in segment 7 measuring 1.2 cm, image 17/2. Stones identified within the dependent portion of the gallbladder measuring up to 8 mm. No gallbladder wall thickening or pericholecystic inflammation. No bile duct dilatation. Pancreas: Unremarkable. No pancreatic ductal dilatation or surrounding inflammatory changes. Spleen: Normal in size without focal abnormality. Adrenals/Urinary Tract: Normal adrenal glands. Bilateral renal calculi are identified. The largest left kidney stone is in the  upper pole measuring 4 mm, image 27/2. The largest right kidney stone is in the lower pole measuring up to 4 mm. There is asymmetric left-sided nephromegaly, hydronephrosis and perinephric fat stranding. Left-sided hydroureter with Peri ureteral soft tissue stranding is noted. Within the distal left ureter just before the bladder there is a stone measuring 7 mm, coronal image 58 and axial image 74. No right-sided hydronephrosis, hydroureter or ureteral calculi. Bladder appears normal. Stomach/Bowel: Small hiatal hernia. The appendix is visualized and appears normal. No bowel wall thickening, inflammation or distension. Vascular/Lymphatic: No significant vascular findings are present. No enlarged abdominal or pelvic lymph nodes. Reproductive: Status post hysterectomy. No adnexal masses. Other: No abdominal wall hernia or abnormality. No abdominopelvic ascites. Musculoskeletal: No acute or significant osseous findings. IMPRESSION: 1. Left-sided obstructive uropathy secondary to 7 mm distal left ureteral stone. 2. Bilateral renal calculi. 3. Cholelithiasis. 4. Small hiatal hernia. Electronically Signed   By: Waddell Calk M.D.   On: 11/20/2023 10:24       11/20/2023, 1:20 PM  Glendia Barba,  MD

## 2023-11-20 NOTE — H&P (View-Only) (Signed)
 Urology Consult  Requesting provider: Andriette Maltos,  PA-C  Reason for consultation: Left ureteral calculus with infection   Assessment/Recommendations: 68 y.o. female with a left distal ureteral calculus, low-grade fever and nitrite positive urine with pyuria Urine culture has been ordered and she has been started on IV antibiotics Recommend cystoscopy with placement left ureteral stent today. We discussed no attempt will be made to remove the stone due to the high risk of septicemia We discussed the risks at length including bleeding, infection, sepsis, ureteral injury, and stent related symptoms including urgency/frequency/dysuria/flank pain/gross hematuria.  There is a low, but not 0, risk of inability to pass the ureteral stent alongside the stone secondary to an impacted stone which would require percutaneous nephrostomy tube by interventional radiology. We discussed she will need follow-up definitive stone treatment in the next 2-4 weeks   Urologic history: 1.  Bilateral nephrolithiasis Ureteroscopic removal left distal calculus 01/2019 CT with bilateral, nonobstructing renal calculi Stone analysis 100% calcium  oxalate monohydrate   Chief Complaint: Flank pain  History of Present Illness: Mary Chen is a 68 y.o. with prior history of stone disease.  I last saw her 08/03/2023 for an annual visit and KUB at that time with bilateral, nonobstructing renal calculi.  Presented to the ED today with acute onset of left flank pain at 0100 this a.m. Associated with chills, nausea and vomiting Pain radiates to the left lower quadrant Evaluation in ED with urinalysis showing nitrite positive urine with 11-20 RBCs/11-20 WBC.  Temp was 99.  She had no leukocytosis. CT renal stone study showed bilateral nonobstructing renal calculi and a 7 mm left distal ureteral calculus with mild-moderate hydronephrosis, renal edema and perinephric stranding She was started on IV ceftriaxone  and  admitted to the hospitalist service for IV antibiotics   Past Medical History:  Diagnosis Date   Complication of anesthesia    Fibrocystic disease of breast    History of kidney stones    Hx pulmonary embolism 03/2008   postoperative, negaive Factor V Leuiden, neg LE ultrasound.    Kidney stone    Ovarian cyst    PONV (postoperative nausea and vomiting)     Past Surgical History:  Procedure Laterality Date   ABDOMINAL HYSTERECTOMY     BREAST BIOPSY Left    neg   BREAST EXCISIONAL BIOPSY Left    neg   CESAREAN SECTION     x2   CYSTOSCOPY WITH STENT PLACEMENT Left 12/03/2018   Procedure: CYSTOSCOPY WITH STENT PLACEMENT;  Surgeon: Twylla Glendia BROCKS, MD;  Location: [facility];  Service: Urology;  Laterality: Left;   CYSTOSCOPY/URETEROSCOPY/HOLMIUM LASER/STENT PLACEMENT Left 01/23/2019   Procedure: CYSTOSCOPY/URETEROSCOPY//STENT EXCHANGE;  Surgeon: Twylla Glendia BROCKS, MD;  Location: [facility];  Service: Urology;  Laterality: Left;   STONE EXTRACTION WITH BASKET  01/23/2019   Procedure: STONE EXTRACTION WITH BASKET;  Surgeon: Twylla Glendia BROCKS, MD;  Location: [facility];  Service: Urology;;   TOTAL ABDOMINAL HYSTERECTOMY W/ BILATERAL SALPINGOOPHORECTOMY  2009   for bilateral ovarian cysts and fibroid uterus    Home Medications:  No outpatient medications have been marked as taking for the 11/20/23 encounter [facility] Encounter).    Allergies: No Known Allergies  Family History  Problem Relation Age of Onset   Cancer Mother        breast   Breast cancer Mother 1   Cancer Father 52       lung Ca mets to liver    Bipolar disorder Brother  self inficted GSW    Breast cancer Daughter     Social History:  reports that she has never smoked. She has never used smokeless tobacco. She reports that she does not drink alcohol and does not use drugs.  ROS: A complete review of systems was performed.  All systems are negative except for pertinent findings as noted.  Physical Exam:  Vital  signs in last 24 hours: Temp:  [98.8 F (37.1 C)-99 F (37.2 C)] 98.8 F (37.1 C) (07/06 1147) Pulse Rate:  [105-116] 105 (07/06 1147) Resp:  [18] 18 (07/06 1147) BP: (103-123)/(72-77) 103/72 (07/06 1147) SpO2:  [96 %-97 %] 97 % (07/06 1147) Weight:  [90.7 kg] 90.7 kg (07/06 0833) Constitutional:  Alert and oriented, No acute distress HEENT: [place] AT Cardiovascular: Regular rate and rhythm Respiratory: Normal respiratory effort, lungs clear bilaterally GI: Abdomen is soft, LLQ tenderness;nondistended, no abdominal masses GU: Left CVA tenderness Psychiatric: Normal mood and affect   Laboratory Data:  Recent Labs    11/20/23 0844  WBC 5.3  HGB 12.9  HCT 40.5   Recent Labs    11/20/23 0844  NA 140  K 3.5  CL 108  CO2 21*  GLUCOSE 140*  BUN 25*  CREATININE 1.14*  CALCIUM  8.8*   No results for input(s): LABPT, INR in the last 72 hours. No results for input(s): LABURIN in the last 72 hours. Results for orders placed or performed during the hospital encounter of 11/20/23  Resp panel by RT-PCR (RSV, Flu A&B, Covid) Anterior Nasal Swab     Status: None   Collection Time: 11/20/23  8:36 AM   Specimen: Anterior Nasal Swab  Result Value Ref Range Status   SARS Coronavirus 2 by RT PCR NEGATIVE NEGATIVE Final    Comment: (NOTE) SARS-CoV-2 target nucleic acids are NOT DETECTED.  The SARS-CoV-2 RNA is generally detectable in upper respiratory specimens during the acute phase of infection. The lowest concentration of SARS-CoV-2 viral copies this assay can detect is 138 copies/mL. A negative result does not preclude SARS-Cov-2 infection and should not be used as the sole basis for treatment or other patient management decisions. A negative result may occur with  improper specimen collection/handling, submission of specimen other than nasopharyngeal swab, presence of viral mutation(s) within the areas targeted by this assay, and inadequate number of viral copies(<138  copies/mL). A negative result must be combined with clinical observations, patient history, and epidemiological information. The expected result is Negative.  Fact Sheet for Patients:  BloggerCourse.com  Fact Sheet for Healthcare Providers:  SeriousBroker.it  This test is no t yet approved or cleared by the [place]  FDA and  has been authorized for detection and/or diagnosis of SARS-CoV-2 by FDA under an Emergency Use Authorization (EUA). This EUA will remain  in effect (meaning this test can be used) for the duration of the COVID-19 declaration under Section 564(b)(1) of the Act, 21 U.S.C.section 360bbb-3(b)(1), unless the authorization is terminated  or revoked sooner.       Influenza A by PCR NEGATIVE NEGATIVE Final   Influenza B by PCR NEGATIVE NEGATIVE Final    Comment: (NOTE) The Xpert Xpress SARS-CoV-2/FLU/RSV plus assay is intended as an aid in the diagnosis of influenza from Nasopharyngeal swab specimens and should not be used as a sole basis for treatment. Nasal washings and aspirates are unacceptable for Xpert Xpress SARS-CoV-2/FLU/RSV testing.  Fact Sheet for Patients: BloggerCourse.com  Fact Sheet for Healthcare Providers: SeriousBroker.it  This test is not yet approved or  cleared by the [place]  FDA and has been authorized for detection and/or diagnosis of SARS-CoV-2 by FDA under an Emergency Use Authorization (EUA). This EUA will remain in effect (meaning this test can be used) for the duration of the COVID-19 declaration under Section 564(b)(1) of the Act, 21 U.S.C. section 360bbb-3(b)(1), unless the authorization is terminated or revoked.     Resp Syncytial Virus by PCR NEGATIVE NEGATIVE Final    Comment: (NOTE) Fact Sheet for Patients: BloggerCourse.com  Fact Sheet for Healthcare  Providers: SeriousBroker.it  This test is not yet approved or cleared by the [place]  FDA and has been authorized for detection and/or diagnosis of SARS-CoV-2 by FDA under an Emergency Use Authorization (EUA). This EUA will remain in effect (meaning this test can be used) for the duration of the COVID-19 declaration under Section 564(b)(1) of the Act, 21 U.S.C. section 360bbb-3(b)(1), unless the authorization is terminated or revoked.  Performed at [facility], [address], [place], [place] [address]      Radiologic Imaging: CT images were personally reviewed and interpreted  CT Renal Stone Study Result Date: 11/20/2023 CLINICAL DATA:  Abdominal pain and flank pain. EXAM: CT ABDOMEN AND PELVIS WITHOUT CONTRAST TECHNIQUE: Multidetector CT imaging of the abdomen and pelvis was performed following the standard protocol without IV contrast. RADIATION DOSE REDUCTION: This exam was performed according to the departmental dose-optimization program which includes automated exposure control, adjustment of the mA and/or kV according to patient size and/or use of iterative reconstruction technique. COMPARISON:  12/02/2018 FINDINGS: Lower chest: No pleural fluid or consolidative change. Scar versus subsegmental atelectasis within the periphery of the left base. Hepatobiliary: Scattered liver cysts. These appears similar in size and multiplicity to the previous exam. The largest is in segment 7 measuring 1.2 cm, image 17/2. Stones identified within the dependent portion of the gallbladder measuring up to 8 mm. No gallbladder wall thickening or pericholecystic inflammation. No bile duct dilatation. Pancreas: Unremarkable. No pancreatic ductal dilatation or surrounding inflammatory changes. Spleen: Normal in size without focal abnormality. Adrenals/Urinary Tract: Normal adrenal glands. Bilateral renal calculi are identified. The largest left kidney stone is in the  upper pole measuring 4 mm, image 27/2. The largest right kidney stone is in the lower pole measuring up to 4 mm. There is asymmetric left-sided nephromegaly, hydronephrosis and perinephric fat stranding. Left-sided hydroureter with Peri ureteral soft tissue stranding is noted. Within the distal left ureter just before the bladder there is a stone measuring 7 mm, coronal image 58 and axial image 74. No right-sided hydronephrosis, hydroureter or ureteral calculi. Bladder appears normal. Stomach/Bowel: Small hiatal hernia. The appendix is visualized and appears normal. No bowel wall thickening, inflammation or distension. Vascular/Lymphatic: No significant vascular findings are present. No enlarged abdominal or pelvic lymph nodes. Reproductive: Status post hysterectomy. No adnexal masses. Other: No abdominal wall hernia or abnormality. No abdominopelvic ascites. Musculoskeletal: No acute or significant osseous findings. IMPRESSION: 1. Left-sided obstructive uropathy secondary to 7 mm distal left ureteral stone. 2. Bilateral renal calculi. 3. Cholelithiasis. 4. Small hiatal hernia. Electronically Signed   By: Waddell Calk M.D.   On: 11/20/2023 10:24       11/20/2023, 1:20 PM  Glendia Barba,  MD

## 2023-11-20 NOTE — Anesthesia Preprocedure Evaluation (Addendum)
 Anesthesia Evaluation  Patient identified by MRN, date of birth, ID band Patient awake    Reviewed: Allergy & Precautions, NPO status , Patient's Chart, lab work & pertinent test results  History of Anesthesia Complications (+) PONV and history of anesthetic complications  Airway Mallampati: III   Neck ROM: Full    Dental  (+) Missing   Pulmonary neg pulmonary ROS   Pulmonary exam normal breath sounds clear to auscultation       Cardiovascular hypertension, Normal cardiovascular exam Rhythm:Regular Rate:Normal  Hx PE 2009, no longer on anticoagulation   Neuro/Psych negative neurological ROS     GI/Hepatic negative GI ROS,,,  Endo/Other  diabetes, Well Controlled, Type 2  Obesity   Renal/GU      Musculoskeletal   Abdominal   Peds  Hematology negative hematology ROS (+)   Anesthesia Other Findings   Reproductive/Obstetrics                              Anesthesia Physical Anesthesia Plan  ASA: 2 and emergent  Anesthesia Plan: General   Post-op Pain Management:    Induction: Intravenous  PONV Risk Score and Plan: 4 or greater and Ondansetron , Dexamethasone , Treatment may vary due to age or medical condition and Scopolamine  patch - Pre-op  Airway Management Planned: LMA  Additional Equipment:   Intra-op Plan:   Post-operative Plan: Extubation in OR  Informed Consent: I have reviewed the patients History and Physical, chart, labs and discussed the procedure including the risks, benefits and alternatives for the proposed anesthesia with the patient or authorized representative who has indicated his/her understanding and acceptance.     Dental advisory given  Plan Discussed with: CRNA  Anesthesia Plan Comments: (Patient consented for risks of anesthesia including but not limited to:  - adverse reactions to medications - damage to eyes, teeth, lips or other oral mucosa -  nerve damage due to positioning  - sore throat or hoarseness - damage to heart, brain, nerves, lungs, other parts of body or loss of life  Informed patient about role of CRNA in peri- and intra-operative care.  Patient voiced understanding.)         Anesthesia Quick Evaluation

## 2023-11-20 NOTE — Plan of Care (Signed)

## 2023-11-20 NOTE — ED Triage Notes (Addendum)
 Pt comes with left sided flank pain that started 1am this morning. Pt states hx of kidney stones and not sure if this is another one.   Pt also body chills at home but denies any fevers.

## 2023-11-20 NOTE — Interval H&P Note (Signed)
 History and Physical Interval Note:  11/20/2023 4:02 PM  Mary Chen  has presented today for surgery, with the diagnosis of left ureter stone.  The various methods of treatment have been discussed with the patient and family. After consideration of risks, benefits and other options for treatment, the patient has consented to  Procedure(s): CYSTOSCOPY, WITH RETROGRADE PYELOGRAM AND URETERAL STENT INSERTION (Left) as a surgical intervention.  The patient's history has been reviewed, patient examined, no change in status, stable for surgery.  I have reviewed the patient's chart and labs.  Questions were answered to the patient's satisfaction.     Keric Zehren C Zeenat Jeanbaptiste

## 2023-11-20 NOTE — ED Provider Notes (Signed)
 [facility] Provider Note    Event Date/Time   First MD Initiated Contact with Patient 11/20/23 [phone]     (approximate)   History   Flank Pain   HPI  Mary Chen is a 68 y.o. female who presents for evaluation of left-sided flank pain and chills that began at approximately 1 AM this morning.  She has had associated nausea and vomiting throughout the morning.  She reports that the pain radiates down into her left lower quadrant.  She has history of kidney stone once before and had a ureteroscopic removal of a left distal calculus in 2020 with Dr. Twylla.  Stone analysis but is 100% calcium  oxalate monohydrate.  Patient Active Problem List   Diagnosis Date Noted   Urolithiasis 11/20/2023   Essential hypertension 11/20/2023   Ureterolithiasis 11/20/2023   Obesity, diabetes, and hypertension syndrome (HCC) 03/11/2023   Hepatic steatosis 02/06/2021   Educated about COVID-19 virus infection 04/24/2019   Bilateral nephrolithiasis 12/16/2018   Obesity 04/22/2016   Vitamin D  deficiency 04/24/2015   S/P total hysterectomy and bilateral salpingo-oophorectomy 04/23/2013   Well woman exam (no gynecological exam) 06/21/2012   Dyslipidemia associated with type 2 diabetes mellitus (HCC) 04/05/2011   Screening for breast cancer 04/05/2011   Screening for colon cancer 04/05/2011   Hx pulmonary embolism    Benign colonic polyp           Physical Exam   Triage Vital Signs: ED Triage Vitals  Encounter Vitals Group     BP 11/20/23 0834 123/77     Girls Systolic BP Percentile --      Girls Diastolic BP Percentile --      Boys Systolic BP Percentile --      Boys Diastolic BP Percentile --      Pulse Rate 11/20/23 0834 (!) 116     Resp 11/20/23 0834 18     Temp 11/20/23 0834 99 F (37.2 C)     Temp src --      SpO2 11/20/23 0834 96 %     Weight 11/20/23 0833 200 lb (90.7 kg)     Height 11/20/23 0833 5' 6 (1.676 m)     Head Circumference --       Peak Flow --      Pain Score 11/20/23 0833 5     Pain Loc --      Pain Education --      Exclude from Growth Chart --     Most recent vital signs: Vitals:   11/20/23 0834 11/20/23 1147  BP: 123/77 103/72  Pulse: (!) 116 (!) 105  Resp: 18 18  Temp: 99 F (37.2 C) 98.8 F (37.1 C)  SpO2: 96% 97%    Physical Exam Vitals and nursing note reviewed.  Constitutional:      General: Awake and alert. No acute distress.    Appearance: Normal appearance.   HENT:     Head: Normocephalic and atraumatic.     Mouth: Mucous membranes are moist.  Eyes:     General: PERRL. Normal EOMs        Right eye: No discharge.        Left eye: No discharge.     Conjunctiva/sclera: Conjunctivae normal.  Cardiovascular:     Rate and Rhythm: Normal rate and regular rhythm.     Pulses: Normal pulses.  Pulmonary:     Effort: Pulmonary effort is normal. No respiratory distress.     Breath sounds: Normal  breath sounds.  Abdominal:     Abdomen is soft. There is left lower quadrant abdominal tenderness. No rebound or guarding. No distention.  No CVA tenderness Musculoskeletal:        General: No swelling. Normal range of motion.     Cervical back: Normal range of motion and neck supple.  Skin:    General: Skin is warm and dry.     Capillary Refill: Capillary refill takes less than 2 seconds.     Findings: No rash.  Neurological:     Mental Status: The patient is awake and alert.      ED Results / Procedures / Treatments   Labs (all labs ordered are listed, but only abnormal results are displayed) Labs Reviewed  URINALYSIS, ROUTINE W REFLEX MICROSCOPIC - Abnormal; Notable for the following components:      Result Value   Color, Urine YELLOW (*)    APPearance HAZY (*)    Hgb urine dipstick SMALL (*)    Protein, ur 30 (*)    Nitrite POSITIVE (*)    Leukocytes,Ua TRACE (*)    Bacteria, UA FEW (*)    All other components within normal limits  CBC - Abnormal; Notable for the following  components:   RBC 5.77 (*)    MCV 70.2 (*)    MCH 22.4 (*)    RDW 17.2 (*)    Platelets 133 (*)    nRBC 0.6 (*)    All other components within normal limits  BASIC METABOLIC PANEL WITH GFR - Abnormal; Notable for the following components:   CO2 21 (*)    Glucose, Bld 140 (*)    BUN 25 (*)    Creatinine, Ser 1.14 (*)    Calcium  8.8 (*)    GFR, Estimated 52 (*)    All other components within normal limits  HEPATIC FUNCTION PANEL - Abnormal; Notable for the following components:   Total Protein 6.4 (*)    Total Bilirubin 1.8 (*)    Bilirubin, Direct 0.3 (*)    Indirect Bilirubin 1.5 (*)    All other components within normal limits  PHOSPHORUS - Abnormal; Notable for the following components:   Phosphorus 1.4 (*)    All other components within normal limits  RESP PANEL BY RT-PCR (RSV, FLU A&B, COVID)  RVPGX2  URINE CULTURE  LIPASE, BLOOD  VITAMIN B12  VITAMIN D  25 HYDROXY (VIT D DEFICIENCY, FRACTURES)  IRON AND TIBC  FOLATE     EKG     RADIOLOGY I independently reviewed and interpreted imaging and agree with radiologists findings.     PROCEDURES:  Critical Care performed:   Procedures   MEDICATIONS ORDERED IN ED: Medications  cefTRIAXone  (ROCEPHIN ) 1 g in sodium chloride  0.9 % 100 mL IVPB (0 g Intravenous Stopped 11/20/23 1140)  0.9 %  sodium chloride  infusion ( Intravenous New Bag/Given 11/20/23 1145)  acetaminophen  (TYLENOL ) tablet 650 mg (has no administration in time range)    Or  acetaminophen  (TYLENOL ) suppository 650 mg (has no administration in time range)  HYDROcodone -acetaminophen  (NORCO/VICODIN) 5-325 MG per tablet 1 tablet (has no administration in time range)  morphine  (PF) 2 MG/ML injection 1 mg (has no administration in time range)  polyethylene glycol (MIRALAX  / GLYCOLAX ) packet 17 g (has no administration in time range)  ondansetron  (ZOFRAN ) tablet 4 mg (has no administration in time range)    Or  ondansetron  (ZOFRAN ) injection 4 mg (has no  administration in time range)  melatonin tablet 5 mg (  has no administration in time range)  ketorolac  (TORADOL ) 15 MG/ML injection 15 mg (15 mg Intravenous Given 11/20/23 0859)  ondansetron  (ZOFRAN ) injection 4 mg (4 mg Intravenous Given 11/20/23 0858)  sodium chloride  0.9 % bolus 1,000 mL (0 mLs Intravenous Stopped 11/20/23 1140)     IMPRESSION / MDM / ASSESSMENT AND PLAN / ED COURSE  I reviewed the triage vital signs and the nursing notes.   Differential diagnosis includes, but is not limited to, ureteral colic, nephrolithiasis, UTI, pyelonephritis, diverticulitis.  Patient is awake and alert, tachycardic on arrival to 116 though normotensive and afebrile.  I reviewed the patient's chart.  Patient had 1 previous ureteral stone in 2022 that was removed by Dr. Twylla.  Further workup is indicated.  Labs obtained revealed no leukocytosis.  She has a mild AKI with a creatinine of 1.14 from 0.88 with a GFR of 52.  Her urinalysis is highly suggestive of infection with leukocytes and nitrates.  She was immediately treated with Rocephin , as well as Toradol  and Zofran  for symptomatic relief.  CT renal stone obtained demonstrates a 7 mm distal ureteral stone with obstructive uropathy.  I immediately consulted Dr. Twylla with urology who has asked for the patient to remain n.p.o. for stenting today.  I consulted the hospitalist for admission, patient was accepted by hospitalist team.  Patient and family are in agreement with plan.   Patient's presentation is most consistent with acute presentation with potential threat to life or bodily function.   Clinical Course as of 11/20/23 1306  Sun Nov 20, 2023  1028 Dr. Twylla paged [JP]    Clinical Course User Index [JP] Akaya Proffit E, PA-C     FINAL CLINICAL IMPRESSION(S) / ED DIAGNOSES   Final diagnoses:  Ureteral colic  Left flank pain  Obstructive uropathy  Complicated UTI (urinary tract infection)     Rx / DC Orders   ED Discharge  Orders     None        Note:  This document was prepared using Dragon voice recognition software and may include unintentional dictation errors.   Tasia Liz E, PA-C 11/20/23 1306    Suzanne Kirsch, MD 11/20/23 [phone]

## 2023-11-20 NOTE — Anesthesia Postprocedure Evaluation (Signed)
 Anesthesia Post Note  Patient: Mary Chen  Procedure(s) Performed: CYSTOSCOPY, WITH RETROGRADE PYELOGRAM AND URETERAL STENT INSERTION (Left: Ureter)  Patient location during evaluation: PACU Anesthesia Type: General Level of consciousness: awake and alert, oriented and patient cooperative Pain management: pain level controlled Vital Signs Assessment: post-procedure vital signs reviewed and stable Respiratory status: spontaneous breathing, nonlabored ventilation and respiratory function stable Cardiovascular status: blood pressure returned to baseline and stable Postop Assessment: adequate PO intake Anesthetic complications: no   No notable events documented.   Last Vitals:  Vitals:   11/20/23 1636 11/20/23 1645  BP: 98/69 96/64  Pulse: (!) 117 (!) 114  Resp: 13 (!) 24  Temp:  37.6 C  SpO2: 95% 94%    Last Pain:  Vitals:   11/20/23 1645  TempSrc:   PainSc: 0-No pain                 Alfonso Ruths

## 2023-11-20 NOTE — Anesthesia Procedure Notes (Signed)
 Procedure Name: LMA Insertion Date/Time: 11/20/2023 3:27 PM  Performed by: Delores Evalene BROCKS, CRNAPre-anesthesia Checklist: Patient identified, Patient being monitored, Timeout performed, Emergency Drugs available and Suction available Patient Re-evaluated:Patient Re-evaluated prior to induction Oxygen Delivery Method: Circle system utilized Preoxygenation: Pre-oxygenation with 100% oxygen Induction Type: IV induction Ventilation: Mask ventilation without difficulty LMA: LMA with gastric port inserted LMA Size: 4.0 Tube type: Oral Number of attempts: 1 Placement Confirmation: positive ETCO2 and breath sounds checked- equal and bilateral Tube secured with: Tape Dental Injury: Teeth and Oropharynx as per pre-operative assessment

## 2023-11-20 NOTE — Op Note (Signed)
    Preoperative diagnosis:  Obstructing left distal ureteral calculus with infection  Postoperative diagnosis:  Same  Procedure:  Cystoscopy Left ureteral stent placement (58F/24 cm) Left retrograde pyelography with interpretation  Surgeon: Glendia C. Clodagh Odenthal, M.D.  Anesthesia: General  Complications: None  Intraoperative findings:  Cystoscopy: Cloudy urine; bladder mucosa without solid or papillary lesions.  UOs normal-appearing bilaterally Left retrograde pyelogram: Moderate left hydronephrosis/hydroureter to a distal ureter calculus  EBL: Minimal  Specimens: Urine left renal pelvis for culture  Indication: Mary Chen is a 68 y.o. female with with an obstructing 7 mm left distal ureteral calculus with flank pain and pyuria/low-grade fever.  Refer to the H&P for details.  After reviewing the management options for treatment, she elected to proceed with the above surgical procedure(s). We have discussed the potential benefits and risks of the procedure, side effects of the proposed treatment, the likelihood of the patient achieving the goals of the procedure, and any potential problems that might occur during the procedure or recuperation. Informed consent has been obtained.  Description of procedure:  The patient was taken to the operating room and general anesthesia was induced.  The patient was placed in the dorsal lithotomy position, prepped and draped in the usual sterile fashion, and preoperative antibiotics were administered. A preoperative time-out was performed.   A 21 French cystoscope sheath with obturator was lubricated and passed per urethra.  A 30 degree lens was then placed and panendoscopy was performed with findings as described above.  Attention was directed to the left ureteral orifice and a 0.038 Sensor guidewire was then advanced up the ureter into the renal pelvis under fluoroscopic guidance.  A 18F open-ended ureteral catheter was then placed over the  guidewire and advanced to the region of the renal pelvis under fluoroscopic guidance.  The guidewire was removed and 5 cc of slightly turbid urine was aspirated and sent for culture.  Retrograde pyelogram was then performed with findings as described above.  The guidewire was replaced and ureteral catheter was removed.   A 58F/24 cm Contour ureteral stent was advanced over the guidewire.  The stent was positioned appropriately under fluoroscopic and cystoscopic guidance.  The wire was then removed with an adequate stent curl noted in the renal pelvis as well as in the bladder.  The bladder was then emptied and the procedure ended.  The patient appeared to tolerate the procedure well and without complications.  After anesthetic reversal the patient was transported to the PACU in stable condition.  Plan: Continue IV antibiotics per the hospitalist service Definitive stone treatment will be scheduled 3-4 weeks   Glendia Barba, MD

## 2023-11-20 NOTE — H&P (Cosign Needed Addendum)
 History and Physical    Mary Chen FMW:[id number] DOB: Sep 02, 1955 DOA: 11/20/2023  DOS: the patient was seen and examined on 11/20/2023  PCP: Marylynn Verneita CROME, MD   Patient coming from: Home  I have personally briefly reviewed patient's old medical records in [facility] Link and CareEverywhere  HPI:  Mary Chen is a 69 y.o. year old female with past medical history of hypertension, hyperlipidemia, diet-controlled type 2 diabetes mellitus, vitamin D  deficiency, history of pulmonary embolism not on anticoagulation, and prior kidney stone in 2020 where she underwent ureteroscopic removal of a left distal calculus with Dr. Twylla.  She presents to [facility] ED with left-sided flank pain that radiates down into her groin and chills that began around 1 AM this morning.  She endorses nausea with vomiting this morning as well as chills.  Denies fever, dysuria, hematuria.  At time of my interview patient was resting and rated pain 2 out of 10.   ED Course: On arrival to [facility] regional ED patient was noted to be afebrile temp 37.2 C, BP 123/77, HR 116, RR 18, SPO2 96% on room air.  At that time she was reporting 5 out of 10 Left-sided flank pain and back pain.CT renal stone study obtained and shows (L) obstructive uropathy secondary to 7 mm distal left ureteral stone, cholelithiasis, and a small hiatal hernia.  Nitrate positive UA with trace leukocytes and protein.  Platelets 133, glucose 140, BUN 25, creatinine 1.14, T. bili 1.8 with normal AST/ALT. She was given Zofran , Toradol , 1 L normal saline bolus, and started on ceftriaxone .  Dr. Kennith with urology consulted by EDP and plans to take patient for stent this afternoon. [facility] contacted for admission.  Review of Systems: As mentioned in the history of present illness. All other systems reviewed and are negative.  Past Medical History:  Diagnosis Date   Complication of anesthesia    Fibrocystic disease of breast    History  of kidney stones    Hx pulmonary embolism 03/2008   postoperative, negaive Factor V Leuiden, neg LE ultrasound.    Kidney stone    Ovarian cyst    PONV (postoperative nausea and vomiting)     Past Surgical History:  Procedure Laterality Date   ABDOMINAL HYSTERECTOMY     BREAST BIOPSY Left    neg   BREAST EXCISIONAL BIOPSY Left    neg   CESAREAN SECTION     x2   CYSTOSCOPY WITH STENT PLACEMENT Left 12/03/2018   Procedure: CYSTOSCOPY WITH STENT PLACEMENT;  Surgeon: Twylla Glendia BROCKS, MD;  Location: [facility] ORS;  Service: Urology;  Laterality: Left;   CYSTOSCOPY/URETEROSCOPY/HOLMIUM LASER/STENT PLACEMENT Left 01/23/2019   Procedure: CYSTOSCOPY/URETEROSCOPY//STENT EXCHANGE;  Surgeon: Twylla Glendia BROCKS, MD;  Location: [facility] ORS;  Service: Urology;  Laterality: Left;   STONE EXTRACTION WITH BASKET  01/23/2019   Procedure: STONE EXTRACTION WITH BASKET;  Surgeon: Twylla Glendia BROCKS, MD;  Location: [facility] ORS;  Service: Urology;;   TOTAL ABDOMINAL HYSTERECTOMY W/ BILATERAL SALPINGOOPHORECTOMY  2009   for bilateral ovarian cysts and fibroid uterus     reports that she has never smoked. She has never used smokeless tobacco. She reports that she does not drink alcohol and does not use drugs.  No Known Allergies  Family History  Problem Relation Age of Onset   Cancer Mother        breast   Breast cancer Mother 76   Cancer Father 82       lung Ca  mets to liver    Bipolar disorder Brother        self inficted GSW    Breast cancer Daughter     Prior to Admission medications   Medication Sig Start Date End Date Taking? Authorizing Provider  meloxicam  (MOBIC ) 15 MG tablet Take 1 tablet (15 mg total) by mouth daily for 14 days. 08/02/23     rosuvastatin  (CRESTOR ) 10 MG tablet TAKE 1 TABLET BY MOUTH TWICE A WEEK 09/01/23   Marylynn Verneita CROME, MD  telmisartan  (MICARDIS ) 20 MG tablet Take 1 tablet (20 mg total) by mouth at bedtime. 03/11/23   Marylynn Verneita CROME, MD  tirzepatide  (MOUNJARO ) 5 MG/0.5ML Pen Inject 5  mg into the skin once a week. 07/07/23   Marylynn Verneita CROME, MD  Vitamin D , Ergocalciferol , (DRISDOL ) 1.25 MG (50000 UNIT) CAPS capsule Take 1 capsule (50,000 Units total) by mouth every 7 (seven) days. 07/06/23   Marylynn Verneita CROME, MD    Physical Exam: Vitals:   11/20/23 9166 11/20/23 0834 11/20/23 1147  BP:  123/77 103/72  Pulse:  (!) 116 (!) 105  Resp:  18 18  Temp:  99 F (37.2 C) 98.8 F (37.1 C)  TempSrc:   Oral  SpO2:  96% 97%  Weight: 90.7 kg    Height: 5' 6 (1.676 m)      Physical Exam   Constitutional: Uncomfortable appearing elderly African-American lady  Respiratory: clear to auscultation bilaterally, no wheezing, no crackles. Normal respiratory effort. No accessory muscle use.  Cardiovascular: Regular rate and rhythm, no murmurs / rubs / gallops. No extremity edema. 2+ radial and pedal pulses.  Abdomen: Obese.  (L) flank tenderness. Bowel sounds positive x4 quadrants.  Musculoskeletal: No joint deformity upper and lower extremities. Good ROM, no contractures. Normal muscle tone.  Skin: Warm, dry.  No rashes, lesions, ulcers.  Neurologic: CN 2-12 grossly intact.  Alert and oriented x 3.   Labs on Admission: I have personally reviewed following labs and imaging studies  CBC: Recent Labs  Lab 11/20/23 0844  WBC 5.3  HGB 12.9  HCT 40.5  MCV 70.2*  PLT 133*   Basic Metabolic Panel: Recent Labs  Lab 11/20/23 0844  NA 140  K 3.5  CL 108  CO2 21*  GLUCOSE 140*  BUN 25*  CREATININE 1.14*  CALCIUM  8.8*   GFR: Estimated Creatinine Clearance: 53.6 mL/min (A) (by C-G formula based on SCr of 1.14 mg/dL (H)). Liver Function Tests: Recent Labs  Lab 11/20/23 0844  AST 36  ALT 19  ALKPHOS 72  BILITOT 1.8*  PROT 6.4*  ALBUMIN 3.6   Recent Labs  Lab 11/20/23 0844  LIPASE 30   No results for input(s): AMMONIA in the last 168 hours. Coagulation Profile: No results for input(s): INR, PROTIME in the last 168 hours. Cardiac Enzymes: No results for  input(s): CKTOTAL, CKMB, CKMBINDEX, TROPONINI, TROPONINIHS in the last 168 hours. BNP (last 3 results) No results for input(s): BNP in the last 8760 hours. HbA1C: No results for input(s): HGBA1C in the last 72 hours. CBG: No results for input(s): GLUCAP in the last 168 hours. Lipid Profile: No results for input(s): CHOL, HDL, LDLCALC, TRIG, CHOLHDL, LDLDIRECT in the last 72 hours. Thyroid  Function Tests: No results for input(s): TSH, T4TOTAL, FREET4, T3FREE, THYROIDAB in the last 72 hours. Anemia Panel: No results for input(s): VITAMINB12, FOLATE, FERRITIN, TIBC, IRON, RETICCTPCT in the last 72 hours. Urine analysis:    Component Value Date/Time   COLORURINE YELLOW (A) 11/20/2023  0844   APPEARANCEUR HAZY (A) 11/20/2023 0844   APPEARANCEUR Cloudy (A) 12/04/2020 1024   LABSPEC 1.013 11/20/2023 0844   PHURINE 5.0 11/20/2023 0844   GLUCOSEU NEGATIVE 11/20/2023 0844   HGBUR SMALL (A) 11/20/2023 0844   BILIRUBINUR NEGATIVE 11/20/2023 0844   BILIRUBINUR Negative 12/04/2020 1024   KETONESUR NEGATIVE 11/20/2023 0844   PROTEINUR 30 (A) 11/20/2023 0844   UROBILINOGEN 4.0 (A) 12/02/2020 1113   NITRITE POSITIVE (A) 11/20/2023 0844   LEUKOCYTESUR TRACE (A) 11/20/2023 0844    Radiological Exams on Admission: I have personally reviewed images CT Renal Stone Study Result Date: 11/20/2023 CLINICAL DATA:  Abdominal pain and flank pain. EXAM: CT ABDOMEN AND PELVIS WITHOUT CONTRAST TECHNIQUE: Multidetector CT imaging of the abdomen and pelvis was performed following the standard protocol without IV contrast. RADIATION DOSE REDUCTION: This exam was performed according to the departmental dose-optimization program which includes automated exposure control, adjustment of the mA and/or kV according to patient size and/or use of iterative reconstruction technique. COMPARISON:  12/02/2018 FINDINGS: Lower chest: No pleural fluid or consolidative change. Scar  versus subsegmental atelectasis within the periphery of the left base. Hepatobiliary: Scattered liver cysts. These appears similar in size and multiplicity to the previous exam. The largest is in segment 7 measuring 1.2 cm, image 17/2. Stones identified within the dependent portion of the gallbladder measuring up to 8 mm. No gallbladder wall thickening or pericholecystic inflammation. No bile duct dilatation. Pancreas: Unremarkable. No pancreatic ductal dilatation or surrounding inflammatory changes. Spleen: Normal in size without focal abnormality. Adrenals/Urinary Tract: Normal adrenal glands. Bilateral renal calculi are identified. The largest left kidney stone is in the upper pole measuring 4 mm, image 27/2. The largest right kidney stone is in the lower pole measuring up to 4 mm. There is asymmetric left-sided nephromegaly, hydronephrosis and perinephric fat stranding. Left-sided hydroureter with Peri ureteral soft tissue stranding is noted. Within the distal left ureter just before the bladder there is a stone measuring 7 mm, coronal image 58 and axial image 74. No right-sided hydronephrosis, hydroureter or ureteral calculi. Bladder appears normal. Stomach/Bowel: Small hiatal hernia. The appendix is visualized and appears normal. No bowel wall thickening, inflammation or distension. Vascular/Lymphatic: No significant vascular findings are present. No enlarged abdominal or pelvic lymph nodes. Reproductive: Status post hysterectomy. No adnexal masses. Other: No abdominal wall hernia or abnormality. No abdominopelvic ascites. Musculoskeletal: No acute or significant osseous findings. IMPRESSION: 1. Left-sided obstructive uropathy secondary to 7 mm distal left ureteral stone. 2. Bilateral renal calculi. 3. Cholelithiasis. 4. Small hiatal hernia. Electronically Signed   By: Waddell Calk M.D.   On: 11/20/2023 10:24    Assessment/Plan Principal Problem:   Ureterolithiasis Active Problems:   Dyslipidemia  associated with type 2 diabetes mellitus (HCC)   Essential hypertension   Active medical issues: ##(L) 7mm Obstructing Ureteral Stone ##Infected Ureterolithiasis CT showing left-sided obstructive uropathy secondary to 7 mm distal left ureteral stone.  UA nitrite and leukocyte positive.  HR 116 on presentation.  Creatinine is elevated to 1.14 with baseline around 0.8-0.9.  Elevated T. bili with normal AST/ALT. - Continue Rocephin  - IVF hydration while n.p.o. - As needed analgesia - As needed antiemetics - Dr. Twylla with urology consulted by EDP, plan to place stent this afternoon.  Keep NPO.  Appreciate their recommendations and management  ##Acute Kidney Injury - Postrenal, secondary to above.  Treat as above. - Avoid nephrotoxins, contrast Dyes, Hypotension and Dehydration  - Continue to Monitor and Trend Renal Function carefully and  repeat metabolic panel with a.m. labs   ##Thrombocytopenia Suspect consumptive in setting of infection.  Patient without bruising or petechiae on exam.  Denies recent episodes of prolonged bleeding. - Treat as above - Monitor for signs and symptoms of bleeding/bruising - Repeat CBC with a.m. labs  Chronic medical issues: #Hypertension - BP soft, will hold home telmisartan   #Hyperlipidemia - Continue home Crestor   #Type 2 diabetes mellitus, without long-term use of insulin , diet controlled Takes Mounjaro  outpatient - q4H CBG monitoring while n.p.o.  VTE prophylaxis:  SCDs  GI prophylaxis: Not indicated  Diet: NPO sips + chips Access: PIV Lines: None Code Status:  Full Code Telemetry: No  Admission status: Observation, Med-Surg Patient is from: Home Anticipated d/c is to: Home Anticipated d/c date is: 1-2 days Patient currently: Receiving IV antibiotics and IV fluid hydration.  Awaiting stent placement with urology.  Family Communication: Sister and daughter at bedside  Consults called: Dr. Twylla with urology called by  EDP   Severity of Illness: The appropriate patient status for this patient is OBSERVATION. Observation status is judged to be reasonable and necessary in order to provide the required intensity of service to ensure the patient's safety. The patient's presenting symptoms, physical exam findings, and initial radiographic and laboratory data in the context of their medical condition is felt to place them at decreased risk for further clinical deterioration. Furthermore, it is anticipated that the patient will be medically stable for discharge from the hospital within 2 midnights of admission.   To reach the provider On-Call:   7AM- 7PM see care teams to locate the attending and reach out to them via [email or web address]. Password: [email or web address] 7PM-7AM contact night-coverage If you still have difficulty reaching the appropriate provider, please page the [facility] (Director on Call) for [facility] on amion for assistance  This document was prepared using Conservation officer, historic buildings and may include unintentional dictation errors.  Rockie Rams FNP-BC, PMHNP-BC Nurse Practitioner [facility]

## 2023-11-21 ENCOUNTER — Encounter: Payer: Self-pay | Admitting: Urology

## 2023-11-21 DIAGNOSIS — Z79899 Other long term (current) drug therapy: Secondary | ICD-10-CM | POA: Diagnosis not present

## 2023-11-21 DIAGNOSIS — Z1152 Encounter for screening for COVID-19: Secondary | ICD-10-CM | POA: Diagnosis not present

## 2023-11-21 DIAGNOSIS — E785 Hyperlipidemia, unspecified: Secondary | ICD-10-CM | POA: Diagnosis not present

## 2023-11-21 DIAGNOSIS — Z90710 Acquired absence of both cervix and uterus: Secondary | ICD-10-CM | POA: Diagnosis not present

## 2023-11-21 DIAGNOSIS — B9620 Unspecified Escherichia coli [E. coli] as the cause of diseases classified elsewhere: Secondary | ICD-10-CM | POA: Diagnosis not present

## 2023-11-21 DIAGNOSIS — Z801 Family history of malignant neoplasm of trachea, bronchus and lung: Secondary | ICD-10-CM | POA: Diagnosis not present

## 2023-11-21 DIAGNOSIS — Z7985 Long-term (current) use of injectable non-insulin antidiabetic drugs: Secondary | ICD-10-CM | POA: Diagnosis not present

## 2023-11-21 DIAGNOSIS — K8020 Calculus of gallbladder without cholecystitis without obstruction: Secondary | ICD-10-CM | POA: Diagnosis not present

## 2023-11-21 DIAGNOSIS — E66812 Obesity, class 2: Secondary | ICD-10-CM | POA: Diagnosis not present

## 2023-11-21 DIAGNOSIS — Z90722 Acquired absence of ovaries, bilateral: Secondary | ICD-10-CM | POA: Diagnosis not present

## 2023-11-21 DIAGNOSIS — N202 Calculus of kidney with calculus of ureter: Secondary | ICD-10-CM | POA: Diagnosis not present

## 2023-11-21 DIAGNOSIS — Z803 Family history of malignant neoplasm of breast: Secondary | ICD-10-CM | POA: Diagnosis not present

## 2023-11-21 DIAGNOSIS — E639 Nutritional deficiency, unspecified: Secondary | ICD-10-CM | POA: Diagnosis not present

## 2023-11-21 DIAGNOSIS — I959 Hypotension, unspecified: Secondary | ICD-10-CM | POA: Diagnosis not present

## 2023-11-21 DIAGNOSIS — Z791 Long term (current) use of non-steroidal anti-inflammatories (NSAID): Secondary | ICD-10-CM | POA: Diagnosis not present

## 2023-11-21 DIAGNOSIS — E1169 Type 2 diabetes mellitus with other specified complication: Secondary | ICD-10-CM | POA: Diagnosis not present

## 2023-11-21 DIAGNOSIS — Z86711 Personal history of pulmonary embolism: Secondary | ICD-10-CM | POA: Diagnosis not present

## 2023-11-21 DIAGNOSIS — N136 Pyonephrosis: Secondary | ICD-10-CM | POA: Diagnosis not present

## 2023-11-21 DIAGNOSIS — I10 Essential (primary) hypertension: Secondary | ICD-10-CM | POA: Diagnosis not present

## 2023-11-21 DIAGNOSIS — N179 Acute kidney failure, unspecified: Secondary | ICD-10-CM | POA: Diagnosis not present

## 2023-11-21 DIAGNOSIS — N201 Calculus of ureter: Secondary | ICD-10-CM | POA: Diagnosis not present

## 2023-11-21 DIAGNOSIS — Z818 Family history of other mental and behavioral disorders: Secondary | ICD-10-CM | POA: Diagnosis not present

## 2023-11-21 DIAGNOSIS — Z6832 Body mass index (BMI) 32.0-32.9, adult: Secondary | ICD-10-CM | POA: Diagnosis not present

## 2023-11-21 DIAGNOSIS — D696 Thrombocytopenia, unspecified: Secondary | ICD-10-CM | POA: Diagnosis not present

## 2023-11-21 LAB — CBC
HCT: 36.4 % (ref 36.0–46.0)
Hemoglobin: 11.8 g/dL — ABNORMAL LOW (ref 12.0–15.0)
MCH: 22.6 pg — ABNORMAL LOW (ref 26.0–34.0)
MCHC: 32.4 g/dL (ref 30.0–36.0)
MCV: 69.9 fL — ABNORMAL LOW (ref 80.0–100.0)
Platelets: 119 K/uL — ABNORMAL LOW (ref 150–400)
RBC: 5.21 MIL/uL — ABNORMAL HIGH (ref 3.87–5.11)
RDW: 16.9 % — ABNORMAL HIGH (ref 11.5–15.5)
WBC: 22.5 K/uL — ABNORMAL HIGH (ref 4.0–10.5)
nRBC: 0 % (ref 0.0–0.2)

## 2023-11-21 LAB — HEMOGLOBIN A1C
Hgb A1c MFr Bld: 6.1 % — ABNORMAL HIGH (ref 4.8–5.6)
Mean Plasma Glucose: 128.37 mg/dL

## 2023-11-21 LAB — COMPREHENSIVE METABOLIC PANEL WITH GFR
ALT: 18 U/L (ref 0–44)
AST: 27 U/L (ref 15–41)
Albumin: 2.8 g/dL — ABNORMAL LOW (ref 3.5–5.0)
Alkaline Phosphatase: 46 U/L (ref 38–126)
Anion gap: 10 (ref 5–15)
BUN: 26 mg/dL — ABNORMAL HIGH (ref 8–23)
CO2: 19 mmol/L — ABNORMAL LOW (ref 22–32)
Calcium: 8.1 mg/dL — ABNORMAL LOW (ref 8.9–10.3)
Chloride: 113 mmol/L — ABNORMAL HIGH (ref 98–111)
Creatinine, Ser: 1.25 mg/dL — ABNORMAL HIGH (ref 0.44–1.00)
GFR, Estimated: 47 mL/min — ABNORMAL LOW (ref >60)
Glucose, Bld: 136 mg/dL — ABNORMAL HIGH (ref 70–99)
Potassium: 4.4 mmol/L (ref 3.5–5.1)
Sodium: 142 mmol/L (ref 135–145)
Total Bilirubin: 1.4 mg/dL — ABNORMAL HIGH (ref 0.0–1.2)
Total Protein: 5.5 g/dL — ABNORMAL LOW (ref 6.5–8.1)

## 2023-11-21 LAB — HIV ANTIBODY (ROUTINE TESTING W REFLEX): HIV Screen 4th Generation wRfx: NONREACTIVE

## 2023-11-21 LAB — PHOSPHORUS: Phosphorus: 3.2 mg/dL (ref 2.5–4.6)

## 2023-11-21 LAB — MAGNESIUM: Magnesium: 1.8 mg/dL (ref 1.7–2.4)

## 2023-11-21 LAB — GLUCOSE, CAPILLARY
Glucose-Capillary: 131 mg/dL — ABNORMAL HIGH (ref 70–99)
Glucose-Capillary: 110 mg/dL — ABNORMAL HIGH (ref 70–99)
Glucose-Capillary: 109 mg/dL — ABNORMAL HIGH (ref 70–99)

## 2023-11-21 LAB — VITAMIN D 25 HYDROXY (VIT D DEFICIENCY, FRACTURES): Vit D, 25-Hydroxy: 77.3 ng/mL (ref 30–100)

## 2023-11-21 MED ORDER — CYANOCOBALAMIN 1000 MCG/ML IJ SOLN
1000.0000 ug | Freq: Every day | INTRAMUSCULAR | Status: DC
  Administered 2023-11-21 – 2023-11-22 (×2): 1000 ug via INTRAMUSCULAR
  Filled 2023-11-21 (×3): qty 1

## 2023-11-21 MED ORDER — VITAMIN B-12 1000 MCG PO TABS: 1000.0000 ug | ORAL_TABLET | Freq: Every day | ORAL | Status: DC

## 2023-11-21 MED ORDER — INSULIN ASPART 100 UNIT/ML IJ SOLN
0.0000 [IU] | Freq: Three times a day (TID) | INTRAMUSCULAR | Status: DC
  Administered 2023-11-22: 2 [IU] via SUBCUTANEOUS
  Filled 2023-11-21: qty 1

## 2023-11-21 MED ORDER — ENOXAPARIN SODIUM 60 MG/0.6ML IJ SOSY
0.5000 mg/kg | PREFILLED_SYRINGE | Freq: Every evening | INTRAMUSCULAR | Status: DC
  Administered 2023-11-21 – 2023-11-22 (×2): 45 mg via SUBCUTANEOUS
  Filled 2023-11-21 (×2): qty 0.6

## 2023-11-21 MED ORDER — SODIUM CHLORIDE 0.9 % IV SOLN: INTRAVENOUS | Status: AC

## 2023-11-21 MED ORDER — SODIUM BICARBONATE 650 MG PO TABS
650.0000 mg | ORAL_TABLET | Freq: Three times a day (TID) | ORAL | Status: AC
  Administered 2023-11-21 – 2023-11-22 (×6): 650 mg via ORAL
  Filled 2023-11-21 (×6): qty 1

## 2023-11-21 NOTE — Progress Notes (Signed)
 PHARMACIST - PHYSICIAN COMMUNICATION  CONCERNING:  Enoxaparin  (Lovenox ) for DVT Prophylaxis    RECOMMENDATION: Patient was prescribed enoxaprin 40mg  q24 hours for VTE prophylaxis.   Filed Weights   11/20/23 0833  Weight: 90.7 kg (200 lb)    Body mass index is 32.28 kg/m.  Estimated Creatinine Clearance: 48.9 mL/min (A) (by C-G formula based on SCr of 1.25 mg/dL (H)).   Based on [facility] policy patient is candidate for enoxaparin  0.5mg /kg TBW SQ every 24 hours based on BMI being >30.  DESCRIPTION: Pharmacy has adjusted enoxaparin  dose per [facility] policy.  Patient is now receiving enoxaparin  45 mg every 24 hours    Damien Napoleon, PharmD Clinical Pharmacist  11/21/2023 4:43 PM

## 2023-11-21 NOTE — Progress Notes (Signed)
 Entered room and introduced myself as RN who helps with admissions and discharges. Patient is in bed with daughter at bedside.   Explained to patient medical readiness day is tomorrow (11/22/23) and this means we hope she is stable and ready for discharge at that time. Patient agrees she hopes she is ready. Patient states her daughter can be here to transport patient home when medically ready. Explained this date is tentative and based on clinical status and final discharge determination will be made by her provider. Patient voiced understanding.  At this time, there are no social barriers to discharge.

## 2023-11-21 NOTE — Progress Notes (Addendum)
 [facility] Progress Note  Patient: Mary Chen    FMW:[id number]  DOA: 11/20/2023     Date of Service: the patient was seen and examined on 11/21/2023  Chief Complaint  Patient presents with   Flank Pain   Brief hospital course: Mary Chen is a 68 y.o. year old female with past medical history of hypertension, hyperlipidemia, diet-controlled type 2 diabetes mellitus, vitamin D  deficiency, history of pulmonary embolism not on anticoagulation, and prior kidney stone in 2020 where she underwent ureteroscopic removal of a left distal calculus with Dr. Twylla.  She presents to [facility] ED with left-sided flank pain that radiates down into her groin and chills that began around 1 AM this morning.  She endorses nausea with vomiting this morning as well as chills.  Denies fever, dysuria, hematuria.  At time of my interview patient was resting and rated pain 2 out of 10.   CT renal stone study obtained and shows (L) obstructive uropathy secondary to 7 mm distal left ureteral stone, cholelithiasis, and a small hiatal hernia.    Urology was consulted and [facility] was consulted for admission and further management as below.  Assessment and Plan:   # (L) 7mm Obstructing Ureteral Stone # Infected Ureterolithiasis CT showing left-sided obstructive uropathy secondary to 7 mm distal left ureteral stone.  UA nitrite and leukocyte positive.  HR 116 on presentation.  Creatinine is elevated to 1.14 with baseline around 0.8-0.9.  Elevated T. bili with normal AST/ALT. - Continue Rocephin  - IVF hydration due to low BP  - As needed analgesia - As needed antiemetics - Dr. Twylla with urology consulted s/p Left ureteral stent insertion Urology recommended to continue Flomax  0.4 mg and Ditropan  5 mg p.o. 3 times daily as needed for bladder spasm Monitor overnight and plan for discharge tomorrow a.m. if remains stable Follow urine culture    # Acute Kidney Injury - Postrenal, secondary  to above.  Treat as above. - Avoid nephrotoxins, contrast Dyes, Hypotension and Dehydration  - Continue to Monitor and Trend Renal Function carefully and repeat metabolic panel with a.m. labs    # Thrombocytopenia Suspect consumptive in setting of infection.  Patient without bruising or petechiae on exam.  Denies recent episodes of prolonged bleeding. - Treat as above - Monitor for signs and symptoms of bleeding/bruising - Repeat CBC with a.m. labs   Chronic medical issues: #Hypertension - BP soft, will hold home telmisartan    #Hyperlipidemia - Continue home Crestor    #Type 2 diabetes mellitus, without long-term use of insulin , diet controlled Takes Mounjaro  outpatient Started NovoLog  sliding scale, monitor CBG Started diabetic diet   Body mass index is 32.28 kg/m.  Interventions:  Diet: Carb modified diet DVT Prophylaxis: Subcutaneous Lovenox    Advance goals of care discussion: Full code  Family Communication: family was present at bedside, at the time of interview.  The pt provided permission to discuss medical plan with the family. Opportunity was given to ask question and all questions were answered satisfactorily.   Disposition:  Pt is from Home, admitted with Left ureteral stone, still has low blood pressure and elevated WBC count, on IV antibiotics, which precludes a safe discharge. Discharge to home, when stable, most likely tomorrow a.m.  Subjective: No significant events overnight, patient remained asymptomatic, no fever or chills, no abdominal pain.  Physical Exam: General: NAD, lying comfortably Appear in no distress, affect appropriate Eyes: PERRLA ENT: Oral Mucosa Clear, moist  Neck: no JVD,  Cardiovascular: S1 and S2  Present, no Murmur,  Respiratory: good respiratory effort, Bilateral Air entry equal and Decreased, no Crackles, no wheezes Abdomen: Bowel Sound present, Soft and no tenderness,  Skin: no rashes Extremities: no Pedal edema, no calf  tenderness Neurologic: without any new focal findings Gait not checked due to patient safety concerns  Vitals:   11/20/23 2108 11/21/23 0347 11/21/23 0733 11/21/23 1603  BP: 98/65 101/69 (!) 88/58 101/70  Pulse: 91 91 80 87  Resp: 16 17 15 18   Temp: 99 F (37.2 C) 98.2 F (36.8 C) 98.7 F (37.1 C) 99.1 F (37.3 C)  TempSrc:   Oral Oral  SpO2: 96% 94% 97% 96%  Weight:      Height:        Intake/Output Summary (Last 24 hours) at 11/21/2023 1634 Last data filed at 11/21/2023 1100 Gross per 24 hour  Intake 2151.24 ml  Output --  Net 2151.24 ml   Filed Weights   11/20/23 0833  Weight: 90.7 kg    Data Reviewed: I have personally reviewed and interpreted daily labs, tele strips, imagings as discussed above. I reviewed all nursing notes, pharmacy notes, vitals, pertinent old records I have discussed plan of care as described above with RN and patient/family.  CBC: Recent Labs  Lab 11/20/23 0844 11/21/23 0231  WBC 5.3 22.5*  HGB 12.9 11.8*  HCT 40.5 36.4  MCV 70.2* 69.9*  PLT 133* 119*   Basic Metabolic Panel: Recent Labs  Lab 11/20/23 0844 11/21/23 0231  NA 140 142  K 3.5 4.4  CL 108 113*  CO2 21* 19*  GLUCOSE 140* 136*  BUN 25* 26*  CREATININE 1.14* 1.25*  CALCIUM  8.8* 8.1*  MG  --  1.8  PHOS 1.4* 3.2    Studies: No results found.  Scheduled Meds:  cyanocobalamin   1,000 mcg Intramuscular Q1200   Followed by   NOREEN ON 11/28/2023] vitamin B-12  1,000 mcg Oral Daily   sodium bicarbonate   650 mg Oral TID   tamsulosin   0.4 mg Oral Daily   Continuous Infusions:  sodium chloride  100 mL/hr at 11/21/23 0944   cefTRIAXone  (ROCEPHIN )  IV 1 g (11/21/23 0946)   PRN Meds: acetaminophen  **OR** acetaminophen , HYDROcodone -acetaminophen , hyoscyamine , melatonin, morphine  injection, ondansetron  **OR** ondansetron  (ZOFRAN ) IV, polyethylene glycol  Time spent: 55 minutes  Author: ELVAN SOR. MD [facility] 11/21/2023 4:34 PM  To reach On-call, see care  teams to locate the attending and reach out to them via [email or web address]. If 7PM-7AM, please contact night-coverage If you still have difficulty reaching the attending provider, please page the [facility] (Director on Call) for [facility] on amion for assistance.

## 2023-11-21 NOTE — Progress Notes (Signed)
 Urology Consult Follow Up  Subjective: POD # 1  Patient is resting comfortably with her daughter, Lonell, at bedside.  She stated that she had an uneventful night.  She feels well this morning.  She is not having any gross hematuria.  She is having some mild urgency.  CBC notes that WBC is trending up to 22.5 with hemoglobin remaining stable at 11.8.  Serum creatinine trended up to 1.25 from 1.14.  Urine cultures pending.    Soft BP this am, but afebrile   Anti-infectives: Anti-infectives (From admission, onward)    Start     Dose/Rate Route Frequency Ordered Stop   11/20/23 0930  cefTRIAXone  (ROCEPHIN ) 1 g in sodium chloride  0.9 % 100 mL IVPB        1 g 200 mL/hr over 30 Minutes Intravenous Every 24 hours 11/20/23 9077         Current Facility-Administered Medications  Medication Dose Route Frequency Provider Last Rate Last Admin   0.9 %  sodium chloride  infusion   Intravenous Continuous Von Bellis, MD       acetaminophen  (TYLENOL ) tablet 650 mg  650 mg Oral Q6H PRN Stoioff, Scott C, MD       Or   acetaminophen  (TYLENOL ) suppository 650 mg  650 mg Rectal Q6H PRN Stoioff, Scott C, MD       cefTRIAXone  (ROCEPHIN ) 1 g in sodium chloride  0.9 % 100 mL IVPB  1 g Intravenous Q24H Stoioff, Scott C, MD   Stopped at 11/20/23 1140   cyanocobalamin  (VITAMIN B12) injection 1,000 mcg  1,000 mcg Intramuscular Q1200 Von Bellis, MD       Followed by   NOREEN ON 11/28/2023] cyanocobalamin  (VITAMIN B12) tablet 1,000 mcg  1,000 mcg Oral Daily Von Bellis, MD       HYDROcodone -acetaminophen  (NORCO/VICODIN) 5-325 MG per tablet 1 tablet  1 tablet Oral Q4H PRN Stoioff, Scott C, MD       hyoscyamine  (ANASPAZ ) disintergrating tablet 0.125-0.25 mg  0.125-0.25 mg Sublingual Q6H PRN Stoioff, Scott C, MD       melatonin tablet 5 mg  5 mg Oral QHS PRN Stoioff, Scott C, MD       morphine  (PF) 2 MG/ML injection 1 mg  1 mg Intravenous Q2H PRN Stoioff, Scott C, MD   1 mg at 11/20/23 1412   ondansetron   (ZOFRAN ) tablet 4 mg  4 mg Oral Q6H PRN Stoioff, Scott C, MD       Or   ondansetron  (ZOFRAN ) injection 4 mg  4 mg Intravenous Q6H PRN Stoioff, Scott C, MD       polyethylene glycol (MIRALAX  / GLYCOLAX ) packet 17 g  17 g Oral Daily PRN Stoioff, Scott C, MD       sodium bicarbonate  tablet 650 mg  650 mg Oral TID Von Bellis, MD       tamsulosin  (FLOMAX ) capsule 0.4 mg  0.4 mg Oral Daily Stoioff, Scott C, MD   0.4 mg at 11/20/23 1846     Objective: Vital signs in last 24 hours: Temp:  [98 F (36.7 C)-99.7 F (37.6 C)] 98.7 F (37.1 C) (07/07 0733) Pulse Rate:  [80-118] 80 (07/07 0733) Resp:  [13-25] 15 (07/07 0733) BP: (83-103)/(58-72) 88/58 (07/07 0733) SpO2:  [93 %-100 %] 97 % (07/07 0733)  Intake/Output from previous day: 07/06 0701 - 07/07 0700 In: 2931.2 [I.V.:2731.2; IV Piggyback:200] Out: 0  Intake/Output this shift: No intake/output data recorded.   Physical Exam Vitals and nursing note reviewed.  Constitutional:  Appearance: Normal appearance.  HENT:     Head: Normocephalic and atraumatic.     Nose: Nose normal.     Mouth/Throat:     Mouth: Mucous membranes are dry.     Pharynx: Oropharynx is clear.  Eyes:     Extraocular Movements: Extraocular movements intact.     Conjunctiva/sclera: Conjunctivae normal.     Pupils: Pupils are equal, round, and reactive to light.  Pulmonary:     Effort: Pulmonary effort is normal.  Abdominal:     General: There is no distension.     Palpations: Abdomen is soft. There is no mass.     Tenderness: There is no abdominal tenderness. There is no right CVA tenderness, left CVA tenderness, guarding or rebound.     Hernia: No hernia is present.  Musculoskeletal:        General: Normal range of motion.     Cervical back: Normal range of motion.  Skin:    General: Skin is warm and dry.  Neurological:     General: No focal deficit present.     Mental Status: She is alert and oriented to person, place, and time. Mental status  is at baseline.  Psychiatric:        Mood and Affect: Mood normal.        Behavior: Behavior normal.        Thought Content: Thought content normal.        Judgment: Judgment normal.     Lab Results:  Recent Labs    11/20/23 0844 11/21/23 0231  WBC 5.3 22.5*  HGB 12.9 11.8*  HCT 40.5 36.4  PLT 133* 119*   BMET Recent Labs    11/20/23 0844 11/21/23 0231  NA 140 142  K 3.5 4.4  CL 108 113*  CO2 21* 19*  GLUCOSE 140* 136*  BUN 25* 26*  CREATININE 1.14* 1.25*  CALCIUM  8.8* 8.1*   PT/INR No results for input(s): LABPROT, INR in the last 72 hours. ABG No results for input(s): PHART, HCO3 in the last 72 hours.  Invalid input(s): PCO2, PO2  Studies/Results: DG OR UROLOGY CYSTO IMAGE ([facility] ONLY) Result Date: 11/20/2023 There is no interpretation for this exam.  This order is for images obtained during a surgical procedure.  Please See Surgeries Tab for more information regarding the procedure.   CT Renal Stone Study Result Date: 11/20/2023 CLINICAL DATA:  Abdominal pain and flank pain. EXAM: CT ABDOMEN AND PELVIS WITHOUT CONTRAST TECHNIQUE: Multidetector CT imaging of the abdomen and pelvis was performed following the standard protocol without IV contrast. RADIATION DOSE REDUCTION: This exam was performed according to the departmental dose-optimization program which includes automated exposure control, adjustment of the mA and/or kV according to patient size and/or use of iterative reconstruction technique. COMPARISON:  12/02/2018 FINDINGS: Lower chest: No pleural fluid or consolidative change. Scar versus subsegmental atelectasis within the periphery of the left base. Hepatobiliary: Scattered liver cysts. These appears similar in size and multiplicity to the previous exam. The largest is in segment 7 measuring 1.2 cm, image 17/2. Stones identified within the dependent portion of the gallbladder measuring up to 8 mm. No gallbladder wall thickening or pericholecystic  inflammation. No bile duct dilatation. Pancreas: Unremarkable. No pancreatic ductal dilatation or surrounding inflammatory changes. Spleen: Normal in size without focal abnormality. Adrenals/Urinary Tract: Normal adrenal glands. Bilateral renal calculi are identified. The largest left kidney stone is in the upper pole measuring 4 mm, image 27/2. The largest right kidney stone is in  the lower pole measuring up to 4 mm. There is asymmetric left-sided nephromegaly, hydronephrosis and perinephric fat stranding. Left-sided hydroureter with Peri ureteral soft tissue stranding is noted. Within the distal left ureter just before the bladder there is a stone measuring 7 mm, coronal image 58 and axial image 74. No right-sided hydronephrosis, hydroureter or ureteral calculi. Bladder appears normal. Stomach/Bowel: Small hiatal hernia. The appendix is visualized and appears normal. No bowel wall thickening, inflammation or distension. Vascular/Lymphatic: No significant vascular findings are present. No enlarged abdominal or pelvic lymph nodes. Reproductive: Status post hysterectomy. No adnexal masses. Other: No abdominal wall hernia or abnormality. No abdominopelvic ascites. Musculoskeletal: No acute or significant osseous findings. IMPRESSION: 1. Left-sided obstructive uropathy secondary to 7 mm distal left ureteral stone. 2. Bilateral renal calculi. 3. Cholelithiasis. 4. Small hiatal hernia. Electronically Signed   By: Waddell Calk M.D.   On: 11/20/2023 10:24     Assessment: 68 year old woman with bilateral nephrolithiasis and a  7 mm left obstructing ureteral stone who underwent a successful left ureteral stent placement with a return of turbid urine in the setting of low-grade fever and nitrate positive urine with pyuria on November 20, 2023.    - WBC count trended upward to 22.5 from 5.3 yesterday, this is likely due to stone manipulation with placement of ureteral stent  - Serum creatinine also trended upward to 1.25  from 1.14 yesterday, again this is likely due to still manipulation with placement of ureteral stent   Plan: - Okay to advance diet from urological perspective - Continue to trend CBC to ensure improvement of leukocytosis with IV antibiotics and placement of the stent to drain infected urine - Continue to trend renal function to ensure improvement of AKI after placement of the stent - Continue IV antibiotics and narrow once urine culture results are available - Patient will need to be on at least 10 days of culture appropriate antibiotics - Discussed with the patient the symptoms of stent discomfort and she voiced her understanding - Recommend tamsulosin  0.4 mg daily and oxybutynin  IR 5 mg 3 times daily as needed for stent discomfort - Explained to the patient that she will need an outpatient procedure to address the stone - would recommend at least another night stay  - Will arrange outpatient follow-up   LOS: 0 days    [facility] 11/21/2023

## 2023-11-22 DIAGNOSIS — N201 Calculus of ureter: Secondary | ICD-10-CM | POA: Diagnosis not present

## 2023-11-22 LAB — GLUCOSE, CAPILLARY
Glucose-Capillary: 77 mg/dL (ref 70–99)
Glucose-Capillary: 85 mg/dL (ref 70–99)
Glucose-Capillary: 123 mg/dL — ABNORMAL HIGH (ref 70–99)
Glucose-Capillary: 97 mg/dL (ref 70–99)

## 2023-11-22 LAB — BASIC METABOLIC PANEL WITH GFR
Anion gap: 8 (ref 5–15)
BUN: 22 mg/dL (ref 8–23)
CO2: 21 mmol/L — ABNORMAL LOW (ref 22–32)
Calcium: 8.1 mg/dL — ABNORMAL LOW (ref 8.9–10.3)
Chloride: 112 mmol/L — ABNORMAL HIGH (ref 98–111)
Creatinine, Ser: 0.7 mg/dL (ref 0.44–1.00)
GFR, Estimated: >60 mL/min (ref >60)
Glucose, Bld: 107 mg/dL — ABNORMAL HIGH (ref 70–99)
Potassium: 3.5 mmol/L (ref 3.5–5.1)
Sodium: 141 mmol/L (ref 135–145)

## 2023-11-22 LAB — CBC
HCT: 35.2 % — ABNORMAL LOW (ref 36.0–46.0)
Hemoglobin: 11.3 g/dL — ABNORMAL LOW (ref 12.0–15.0)
MCH: 22.3 pg — ABNORMAL LOW (ref 26.0–34.0)
MCHC: 32.1 g/dL (ref 30.0–36.0)
MCV: 69.4 fL — ABNORMAL LOW (ref 80.0–100.0)
Platelets: 110 K/uL — ABNORMAL LOW (ref 150–400)
RBC: 5.07 MIL/uL (ref 3.87–5.11)
RDW: 16.7 % — ABNORMAL HIGH (ref 11.5–15.5)
WBC: 21.6 K/uL — ABNORMAL HIGH (ref 4.0–10.5)
nRBC: 0 % (ref 0.0–0.2)

## 2023-11-22 LAB — URINE CULTURE: Culture: 20000 — AB

## 2023-11-22 LAB — MAGNESIUM: Magnesium: 2 mg/dL (ref 1.7–2.4)

## 2023-11-22 LAB — PHOSPHORUS: Phosphorus: 1.9 mg/dL — ABNORMAL LOW (ref 2.5–4.6)

## 2023-11-22 MED ORDER — POTASSIUM PHOSPHATES 15 MMOLE/5ML IV SOLN
30.0000 mmol | Freq: Once | INTRAVENOUS | Status: AC
  Administered 2023-11-22: 30 mmol via INTRAVENOUS
  Filled 2023-11-22: qty 10

## 2023-11-22 NOTE — Progress Notes (Signed)
 [facility] Progress Note  Patient: Mary Chen    FMW:[id number]  DOA: 11/20/2023     Date of Service: the patient was seen and examined on 11/22/2023  Chief Complaint  Patient presents with   Flank Pain   Brief hospital course: Mary Chen is a 68 y.o. year old female with past medical history of hypertension, hyperlipidemia, diet-controlled type 2 diabetes mellitus, vitamin D  deficiency, history of pulmonary embolism not on anticoagulation, and prior kidney stone in 2020 where she underwent ureteroscopic removal of a left distal calculus with Dr. Twylla.  She presents to [facility] ED with left-sided flank pain that radiates down into her groin and chills that began around 1 AM this morning.  She endorses nausea with vomiting this morning as well as chills.  Denies fever, dysuria, hematuria.  At time of my interview patient was resting and rated pain 2 out of 10.   CT renal stone study obtained and shows (L) obstructive uropathy secondary to 7 mm distal left ureteral stone, cholelithiasis, and a small hiatal hernia.    Urology was consulted and [facility] was consulted for admission and further management as below.  Assessment and Plan:   # (L) 7mm Obstructing Ureteral Stone # Infected Ureterolithiasis CT showing left-sided obstructive uropathy secondary to 7 mm distal left ureteral stone.  UA nitrite and leukocyte positive.  HR 116 on presentation.  Creatinine is elevated to 1.14 with baseline around 0.8-0.9.  Elevated T. bili with normal AST/ALT. - Continue Rocephin  - IVF hydration due to low BP  - As needed analgesia - As needed antiemetics - Dr. Twylla with urology consulted s/p Left ureteral stent insertion Urology recommended to continue Flomax  0.4 mg and Ditropan  5 mg p.o. 3 times daily as needed for bladder spasm Monitor overnight and plan for discharge tomorrow a.m. if remains stable urine culture growing E. coli, pansensitive WBC 21.6, still elevated     # Acute Kidney Injury - Postrenal, secondary to above.  Treat as above. - Avoid nephrotoxins, contrast Dyes, Hypotension and Dehydration  - Continue to Monitor and Trend Renal Function carefully and repeat metabolic panel with a.m. labs    # Thrombocytopenia Suspect consumptive in setting of infection.  Patient without bruising or petechiae on exam.  Denies recent episodes of prolonged bleeding. - Treat as above - Monitor for signs and symptoms of bleeding/bruising - Repeat CBC with a.m. labs   # Hypophosphatemia due to nutritional deficiency. Phosphorus repleted.  Monitor electrolytes.  # Vitamin B12 level 196, goal >400. Started vitamin B12 1000 mcg IM injection daily during hospital stay, followed by oral supplement.  Follow-up PCP to repeat vitamin B12 level after 3 to 6 months.   Chronic medical issues: #Hypertension - BP soft, will hold home telmisartan    #Hyperlipidemia - Continue home Crestor    #Type 2 diabetes mellitus, without long-term use of insulin , diet controlled Takes Mounjaro  outpatient Started NovoLog  sliding scale, monitor CBG Started diabetic diet   Body mass index is 32.28 kg/m.  Interventions:  Diet: Carb modified diet DVT Prophylaxis: Subcutaneous Lovenox    Advance goals of care discussion: Full code  Family Communication: family was present at bedside, at the time of interview.  The pt provided permission to discuss medical plan with the family. Opportunity was given to ask question and all questions were answered satisfactorily.   Disposition:  Pt is from Home, admitted with Left ureteral stone, still has low blood pressure and elevated WBC count, on IV antibiotics, which precludes a  safe discharge. Discharge to home, when stable, most likely tomorrow a.m.  Subjective: No significant events overnight, patient remained asymptomatic, no fever or chills, no abdominal pain.  Physical Exam: General: NAD, lying comfortably Appear in no distress,  affect appropriate Eyes: PERRLA ENT: Oral Mucosa Clear, moist  Neck: no JVD,  Cardiovascular: S1 and S2 Present, no Murmur,  Respiratory: good respiratory effort, Bilateral Air entry equal and Decreased, no Crackles, no wheezes Abdomen: Bowel Sound present, Soft and no tenderness,  Skin: no rashes Extremities: no Pedal edema, no calf tenderness Neurologic: without any new focal findings Gait not checked due to patient safety concerns  Vitals:   11/21/23 2053 11/22/23 0334 11/22/23 0737 11/22/23 1557  BP: 112/73 116/77 99/65 112/76  Pulse: 100 92 78 (!) 57  Resp: 18 18 16 18   Temp: 100 F (37.8 C) 98.9 F (37.2 C) 98 F (36.7 C) 98.7 F (37.1 C)  TempSrc:   Oral Oral  SpO2: 96% 96% 96% 97%  Weight:      Height:        Intake/Output Summary (Last 24 hours) at 11/22/2023 1624 Last data filed at 11/22/2023 0555 Gross per 24 hour  Intake 1265.77 ml  Output --  Net 1265.77 ml   Filed Weights   11/20/23 0833  Weight: 90.7 kg    Data Reviewed: I have personally reviewed and interpreted daily labs, tele strips, imagings as discussed above. I reviewed all nursing notes, pharmacy notes, vitals, pertinent old records I have discussed plan of care as described above with RN and patient/family.  CBC: Recent Labs  Lab 11/20/23 0844 11/21/23 0231 11/22/23 0338  WBC 5.3 22.5* 21.6*  HGB 12.9 11.8* 11.3*  HCT 40.5 36.4 35.2*  MCV 70.2* 69.9* 69.4*  PLT 133* 119* 110*   Basic Metabolic Panel: Recent Labs  Lab 11/20/23 0844 11/21/23 0231 11/22/23 0338  NA 140 142 141  K 3.5 4.4 3.5  CL 108 113* 112*  CO2 21* 19* 21*  GLUCOSE 140* 136* 107*  BUN 25* 26* 22  CREATININE 1.14* 1.25* 0.70  CALCIUM  8.8* 8.1* 8.1*  MG  --  1.8 2.0  PHOS 1.4* 3.2 1.9*    Studies: No results found.  Scheduled Meds:  cyanocobalamin   1,000 mcg Intramuscular Q1200   Followed by   Mary Chen ON 11/28/2023] vitamin B-12  1,000 mcg Oral Daily   enoxaparin  (LOVENOX ) injection  0.5 mg/kg  Subcutaneous QPM   insulin  aspart  0-15 Units Subcutaneous TID WC   sodium bicarbonate   650 mg Oral TID   tamsulosin   0.4 mg Oral Daily   Continuous Infusions:  cefTRIAXone  (ROCEPHIN )  IV 1 g (11/22/23 0827)   potassium PHOSPHATE  IVPB (in mmol) 30 mmol (11/22/23 1215)   PRN Meds: acetaminophen  **OR** acetaminophen , HYDROcodone -acetaminophen , hyoscyamine , melatonin, morphine  injection, ondansetron  **OR** ondansetron  (ZOFRAN ) IV, polyethylene glycol  Time spent: 40 minutes  Author: ELVAN SOR. MD [facility] 11/22/2023 4:24 PM  To reach On-call, see care teams to locate the attending and reach out to them via [email or web address]. If 7PM-7AM, please contact night-coverage If you still have difficulty reaching the attending provider, please page the [facility] (Director on Call) for [facility] on amion for assistance.

## 2023-11-22 NOTE — TOC CM/SW Note (Signed)
 Transition of Care [facility]) - Inpatient Brief Assessment   Patient Details  Name: Mary Chen MRN: [id number] Date of Birth: Oct 24, 1955  Transition of [facility]) CM/SW Contact:    Corean ONEIDA Haddock, RN Phone Number: 11/22/2023, 11:36 AM   Clinical Narrative:    Transition of Care (TOC) Screening Note   Patient Details  Name: Mary Chen Date of Birth: 1956-01-14   Transition of Care [facility]) CM/SW Contact:    Corean ONEIDA Haddock, RN Phone Number: 11/22/2023, 11:36 AM    Transition of Care Department [facility]) has reviewed patient and no TOC needs have been identified at this time.  If new patient transition needs arise, please place a TOC consult.   Transition of Care Asessment: Insurance and Status: Insurance coverage has been reviewed Patient has primary care physician: Yes     Prior/Current Home Services: No current home services Social Drivers of Health Review: SDOH reviewed no interventions necessary Readmission risk has been reviewed: Yes Transition of care needs: no transition of care needs at this time

## 2023-11-22 NOTE — Plan of Care (Signed)

## 2023-11-23 ENCOUNTER — Telehealth: Payer: Self-pay

## 2023-11-23 ENCOUNTER — Other Ambulatory Visit: Payer: Self-pay

## 2023-11-23 DIAGNOSIS — N201 Calculus of ureter: Secondary | ICD-10-CM | POA: Diagnosis not present

## 2023-11-23 LAB — GLUCOSE, CAPILLARY: Glucose-Capillary: 104 mg/dL — ABNORMAL HIGH (ref 70–99)

## 2023-11-23 LAB — URINE CULTURE: Culture: >=100000 — AB

## 2023-11-23 LAB — BASIC METABOLIC PANEL WITH GFR
Anion gap: 8 (ref 5–15)
BUN: 16 mg/dL (ref 8–23)
CO2: 22 mmol/L (ref 22–32)
Calcium: 8.3 mg/dL — ABNORMAL LOW (ref 8.9–10.3)
Chloride: 113 mmol/L — ABNORMAL HIGH (ref 98–111)
Creatinine, Ser: 0.78 mg/dL (ref 0.44–1.00)
GFR, Estimated: >60 mL/min (ref >60)
Glucose, Bld: 90 mg/dL (ref 70–99)
Potassium: 3.8 mmol/L (ref 3.5–5.1)
Sodium: 143 mmol/L (ref 135–145)

## 2023-11-23 LAB — PHOSPHORUS: Phosphorus: 3.1 mg/dL (ref 2.5–4.6)

## 2023-11-23 LAB — CBC
HCT: 35.5 % — ABNORMAL LOW (ref 36.0–46.0)
Hemoglobin: 11.3 g/dL — ABNORMAL LOW (ref 12.0–15.0)
MCH: 21.8 pg — ABNORMAL LOW (ref 26.0–34.0)
MCHC: 31.8 g/dL (ref 30.0–36.0)
MCV: 68.4 fL — ABNORMAL LOW (ref 80.0–100.0)
Platelets: 123 K/uL — ABNORMAL LOW (ref 150–400)
RBC: 5.19 MIL/uL — ABNORMAL HIGH (ref 3.87–5.11)
RDW: 15.7 % — ABNORMAL HIGH (ref 11.5–15.5)
WBC: 14.4 K/uL — ABNORMAL HIGH (ref 4.0–10.5)
nRBC: 0 % (ref 0.0–0.2)

## 2023-11-23 LAB — MAGNESIUM: Magnesium: 2.1 mg/dL (ref 1.7–2.4)

## 2023-11-23 MED ORDER — VITAMIN B-12 1000 MCG PO TABS: 1000.0000 ug | ORAL_TABLET | Freq: Every day | ORAL | Status: DC

## 2023-11-23 MED ORDER — TAMSULOSIN HCL 0.4 MG PO CAPS
0.4000 mg | ORAL_CAPSULE | Freq: Every day | ORAL | 0 refills | Status: AC
  Filled 2023-11-23: qty 30, 30d supply, fill #0

## 2023-11-23 MED ORDER — CYANOCOBALAMIN 1000 MCG/ML IJ SOLN
1000.0000 ug | Freq: Every day | INTRAMUSCULAR | Status: DC
  Filled 2023-11-23: qty 1

## 2023-11-23 MED ORDER — CIPROFLOXACIN HCL 500 MG PO TABS
500.0000 mg | ORAL_TABLET | Freq: Two times a day (BID) | ORAL | 0 refills | Status: AC
  Filled 2023-11-23: qty 20, 10d supply, fill #0

## 2023-11-23 MED ORDER — CYANOCOBALAMIN 1000 MCG PO TABS
1000.0000 ug | ORAL_TABLET | Freq: Every day | ORAL | 2 refills | Status: AC
Start: 2023-11-28 — End: 2024-02-26
  Filled 2023-11-23: qty 30, 30d supply, fill #0

## 2023-11-23 NOTE — Care Management Important Message (Signed)
 Important Message  Patient Details  Name: Mary Chen MRN: [id number] Date of Birth: 1955-12-09   Important Message Given:  Yes - Medicare IM     [name] 11/23/2023, 11:54 AM

## 2023-11-23 NOTE — Telephone Encounter (Signed)
 noted

## 2023-11-23 NOTE — Telephone Encounter (Signed)
 Copied from CRM [id number]. Topic: Appointments - Scheduling Inquiry for Clinic >> Nov 23, 2023 10:52 AM Ernestene SQUIBB wrote: Reason for CRM: pt will be getting discharge 07/9- there is no openings within 14 day period - pt can be reached [phone]   ----------------------------------------------------------------------- From previous Reason for Contact - Scheduling: Patient/patient representative is calling to schedule an appointment. Refer to attachments for appointment information.

## 2023-11-23 NOTE — Plan of Care (Signed)
 Pt D/C home, with follow up for urology. IV removed, AVS reviewed with follow up questions answered TOC provided meds. No other needs voiced at this time. BP 118/81 (BP Location: Left Arm)   Pulse 83   Temp 98.2 F (36.8 C) (Oral)   Resp 15   Ht 5' 6 (1.676 m)   Wt 90.7 kg   SpO2 98%   BMI 32.28 kg/m  Mary Chen 11/23/23 10:55 AM

## 2023-11-23 NOTE — Discharge Summary (Signed)
 [facility] Discharge Summary   Patient: Mary Chen FMW:[id number]  PCP: Marylynn Verneita CROME, MD  Date of admission: 11/20/2023   Date of discharge:  11/23/2023     Discharge Diagnoses:  Principal Problem:   Ureterolithiasis Active Problems:   Dyslipidemia associated with type 2 diabetes mellitus (HCC)   Essential hypertension   Admitted From: Home Disposition:  Home   Recommendations for Outpatient Follow-up:  F/u PCP in 1 wk F/u Urology in 1 wk Follow up LABS/TEST:  Repaet CBC in 1 wk   Follow-up Information     Marylynn Verneita CROME, MD Follow up in 1 week(s).   Specialty: Internal Medicine Contact information: [address] [place] [address] [phone]         Twylla Glendia BROCKS, MD Follow up in 1 week(s).   Specialty: Urology Contact information: [address] [place] [address] [phone]                Diet recommendation: Cardiac and Carb modified diet  Activity: The patient is advised to gradually reintroduce usual activities, as tolerated  Discharge Condition: stable  Code Status: Full code   History of present illness: As per the H and P dictated on admission Hospital Course:  Mary Chen is a 68 y.o. year old female with past medical history of hypertension, hyperlipidemia, diet-controlled type 2 diabetes mellitus, vitamin D  deficiency, history of pulmonary embolism not on anticoagulation, and prior kidney stone in 2020 where she underwent ureteroscopic removal of a left distal calculus with Dr. Twylla.  She presents to [facility] ED with left-sided flank pain that radiates down into her groin and chills that began around 1 AM this morning.  She endorses nausea with vomiting this morning as well as chills.  Denies fever, dysuria, hematuria.  At time of my interview patient was resting and rated pain 2 out of 10.    CT renal stone study obtained and shows (L) obstructive uropathy secondary  to 7 mm distal left ureteral stone, cholelithiasis, and a small hiatal hernia.     Urology was consulted and [facility] was consulted for admission and further management as below.   Assessment and Plan:     # (L) 7mm Obstructing Ureteral Stone # Infected Ureterolithiasis CT showing left-sided obstructive uropathy secondary to 7 mm distal left ureteral stone.  UA nitrite and leukocyte positive.  HR 116 on presentation.  Creatinine is elevated to 1.14 with baseline around 0.8-0.9.  Elevated T. bili with normal AST/ALT. S/p IV fluid given for hydration. S/p Rocephin  - Dr. Twylla with urology consulted s/p Left ureteral stent insertion Urology recommended to continue Flomax  0.4 mg and Ditropan  5 mg p.o. 3 times daily as needed for bladder spasm urine culture growing E. coli, pansensitive.  WBC 14.4, gradually improving.  Patient was cleared by urology to discharge and follow-up as an outpatient. Patient was discharged on ciprofloxacin  5 mg p.o. twice daily for 10 days.  Follow with urology in 1 week     # Acute Kidney Injury.  Resolved AKI prerenal, renal functions improved after IV fluids   # Thrombocytopenia Suspect consumptive in setting of infection.  Patient without bruising or petechiae on exam.  Denies recent episodes of prolonged bleeding.  CBC platelet count 123, remained stable   # Hypophosphatemia due to nutritional deficiency. Phosphorus repleted.  Resolved   # Vitamin B12 level 196, goal >400. Started vitamin B12 1000 mcg IM injection daily during hospital stay, followed  by oral supplement.  Follow-up PCP to repeat vitamin B12 level after 3 to 6 months.     Chronic medical issues: #Hypertension: BP was soft, so held home telmisartan , but resumed on discharge.  Patient was advised to monitor BP at home and follow with PCP.   #Hyperlipidemia: Continue home Crestor    #Type 2 diabetes mellitus, without long-term use of insulin , diet controlled: Used to take Mounjaro  outpatient.   Monitor CBG at home and follow with PCP.  Continue diabetic diet.     Body mass index is 32.28 kg/m.  Nutrition Interventions:  Patient was ambulatory without any assistance. On the day of the discharge the patient's vitals were stable, and no other acute medical condition were reported by patient. the patient was felt safe to be discharge at Home.  Consultants: Urology Procedures: s/p Left ureteral stent insertion  Discharge Exam: General: Appear in no distress, no Rash; Oral Mucosa Clear, moist. Cardiovascular: S1 and S2 Present, no Murmur, Respiratory: normal respiratory effort, Bilateral Air entry present and no Crackles, no wheezes Abdomen: Bowel Sound present, Soft and no tenderness, no hernia Extremities: no Pedal edema, no calf tenderness Neurology: alert and oriented to time, place, and person affect appropriate.  Filed Weights   11/20/23 0833  Weight: 90.7 kg   Vitals:   11/23/23 0345 11/23/23 0822  BP: 120/74 118/81  Pulse: 88 83  Resp: 18 15  Temp: 98.6 F (37 C) 98.2 F (36.8 C)  SpO2: 96% 98%    DISCHARGE MEDICATION: Allergies as of 11/23/2023   No Known Allergies      Medication List     STOP taking these medications    meloxicam  15 MG tablet Commonly known as: MOBIC    tirzepatide  5 MG/0.5ML Pen Commonly known as: MOUNJARO        TAKE these medications    ciprofloxacin  500 MG tablet Commonly known as: Cipro  Take 1 tablet (500 mg total) by mouth 2 (two) times daily for 10 days.   cyanocobalamin  1000 MCG tablet Take 1 tablet (1,000 mcg total) by mouth daily. Start taking on: November 28, 2023   rosuvastatin  10 MG tablet Commonly known as: CRESTOR  TAKE 1 TABLET BY MOUTH TWICE A WEEK   tamsulosin  0.4 MG Caps capsule Commonly known as: FLOMAX  Take 1 capsule (0.4 mg total) by mouth daily. Start taking on: November 24, 2023   telmisartan  20 MG tablet Commonly known as: MICARDIS  Take 1 tablet (20 mg total) by mouth at bedtime.   Vitamin D   (Ergocalciferol ) 1.25 MG (50000 UNIT) Caps capsule Commonly known as: DRISDOL  Take 1 capsule (50,000 Units total) by mouth every 7 (seven) days.       No Known Allergies Discharge Instructions     Call MD for:  difficulty breathing, headache or visual disturbances   Complete by: As directed    Call MD for:  extreme fatigue   Complete by: As directed    Call MD for:  persistant dizziness or light-headedness   Complete by: As directed    Call MD for:  persistant nausea and vomiting   Complete by: As directed    Call MD for:  severe uncontrolled pain   Complete by: As directed    Call MD for:  temperature >100.4   Complete by: As directed    Diet Carb Modified   Complete by: As directed    Discharge instructions   Complete by: As directed    F/u PCP in 1 wk F/u Urology in 1 wk Repaet  CBC in 1 wk   Increase activity slowly   Complete by: As directed        The results of significant diagnostics from this hospitalization (including imaging, microbiology, ancillary and laboratory) are listed below for reference.    Significant Diagnostic Studies: DG OR UROLOGY CYSTO IMAGE ([facility] ONLY) Result Date: 11/20/2023 There is no interpretation for this exam.  This order is for images obtained during a surgical procedure.  Please See Surgeries Tab for more information regarding the procedure.   CT Renal Stone Study Result Date: 11/20/2023 CLINICAL DATA:  Abdominal pain and flank pain. EXAM: CT ABDOMEN AND PELVIS WITHOUT CONTRAST TECHNIQUE: Multidetector CT imaging of the abdomen and pelvis was performed following the standard protocol without IV contrast. RADIATION DOSE REDUCTION: This exam was performed according to the departmental dose-optimization program which includes automated exposure control, adjustment of the mA and/or kV according to patient size and/or use of iterative reconstruction technique. COMPARISON:  12/02/2018 FINDINGS: Lower chest: No pleural fluid or consolidative  change. Scar versus subsegmental atelectasis within the periphery of the left base. Hepatobiliary: Scattered liver cysts. These appears similar in size and multiplicity to the previous exam. The largest is in segment 7 measuring 1.2 cm, image 17/2. Stones identified within the dependent portion of the gallbladder measuring up to 8 mm. No gallbladder wall thickening or pericholecystic inflammation. No bile duct dilatation. Pancreas: Unremarkable. No pancreatic ductal dilatation or surrounding inflammatory changes. Spleen: Normal in size without focal abnormality. Adrenals/Urinary Tract: Normal adrenal glands. Bilateral renal calculi are identified. The largest left kidney stone is in the upper pole measuring 4 mm, image 27/2. The largest right kidney stone is in the lower pole measuring up to 4 mm. There is asymmetric left-sided nephromegaly, hydronephrosis and perinephric fat stranding. Left-sided hydroureter with Peri ureteral soft tissue stranding is noted. Within the distal left ureter just before the bladder there is a stone measuring 7 mm, coronal image 58 and axial image 74. No right-sided hydronephrosis, hydroureter or ureteral calculi. Bladder appears normal. Stomach/Bowel: Small hiatal hernia. The appendix is visualized and appears normal. No bowel wall thickening, inflammation or distension. Vascular/Lymphatic: No significant vascular findings are present. No enlarged abdominal or pelvic lymph nodes. Reproductive: Status post hysterectomy. No adnexal masses. Other: No abdominal wall hernia or abnormality. No abdominopelvic ascites. Musculoskeletal: No acute or significant osseous findings. IMPRESSION: 1. Left-sided obstructive uropathy secondary to 7 mm distal left ureteral stone. 2. Bilateral renal calculi. 3. Cholelithiasis. 4. Small hiatal hernia. Electronically Signed   By: Waddell Calk M.D.   On: 11/20/2023 10:24    Microbiology: Recent Results (from the past 240 hours)  Resp panel by RT-PCR  (RSV, Flu A&B, Covid) Anterior Nasal Swab     Status: None   Collection Time: 11/20/23  8:36 AM   Specimen: Anterior Nasal Swab  Result Value Ref Range Status   SARS Coronavirus 2 by RT PCR NEGATIVE NEGATIVE Final    Comment: (NOTE) SARS-CoV-2 target nucleic acids are NOT DETECTED.  The SARS-CoV-2 RNA is generally detectable in upper respiratory specimens during the acute phase of infection. The lowest concentration of SARS-CoV-2 viral copies this assay can detect is 138 copies/mL. A negative result does not preclude SARS-Cov-2 infection and should not be used as the sole basis for treatment or other patient management decisions. A negative result may occur with  improper specimen collection/handling, submission of specimen other than nasopharyngeal swab, presence of viral mutation(s) within the areas targeted by this assay, and inadequate number of  viral copies(<138 copies/mL). A negative result must be combined with clinical observations, patient history, and epidemiological information. The expected result is Negative.  Fact Sheet for Patients:  BloggerCourse.com  Fact Sheet for Healthcare Providers:  SeriousBroker.it  This test is no t yet approved or cleared by the [place]  FDA and  has been authorized for detection and/or diagnosis of SARS-CoV-2 by FDA under an Emergency Use Authorization (EUA). This EUA will remain  in effect (meaning this test can be used) for the duration of the COVID-19 declaration under Section 564(b)(1) of the Act, 21 U.S.C.section 360bbb-3(b)(1), unless the authorization is terminated  or revoked sooner.       Influenza A by PCR NEGATIVE NEGATIVE Final   Influenza B by PCR NEGATIVE NEGATIVE Final    Comment: (NOTE) The Xpert Xpress SARS-CoV-2/FLU/RSV plus assay is intended as an aid in the diagnosis of influenza from Nasopharyngeal swab specimens and should not be used as a sole basis for  treatment. Nasal washings and aspirates are unacceptable for Xpert Xpress SARS-CoV-2/FLU/RSV testing.  Fact Sheet for Patients: BloggerCourse.com  Fact Sheet for Healthcare Providers: SeriousBroker.it  This test is not yet approved or cleared by the [place]  FDA and has been authorized for detection and/or diagnosis of SARS-CoV-2 by FDA under an Emergency Use Authorization (EUA). This EUA will remain in effect (meaning this test can be used) for the duration of the COVID-19 declaration under Section 564(b)(1) of the Act, 21 U.S.C. section 360bbb-3(b)(1), unless the authorization is terminated or revoked.     Resp Syncytial Virus by PCR NEGATIVE NEGATIVE Final    Comment: (NOTE) Fact Sheet for Patients: BloggerCourse.com  Fact Sheet for Healthcare Providers: SeriousBroker.it  This test is not yet approved or cleared by the [place]  FDA and has been authorized for detection and/or diagnosis of SARS-CoV-2 by FDA under an Emergency Use Authorization (EUA). This EUA will remain in effect (meaning this test can be used) for the duration of the COVID-19 declaration under Section 564(b)(1) of the Act, 21 U.S.C. section 360bbb-3(b)(1), unless the authorization is terminated or revoked.  Performed at [facility], [address] [place], [place] [address]   Urine Culture (for pregnant, neutropenic or urologic patients or patients with an indwelling urinary catheter)     Status: Abnormal   Collection Time: 11/20/23  8:44 AM   Specimen: Urine, Clean Catch  Result Value Ref Range Status   Specimen Description   Final    URINE, CLEAN CATCH Performed at [facility], [address], [place], [place] [address]    Special Requests   Final    NONE Performed at [facility], [address] [place], [place] [address]    Culture (A)  Final     >=100,000 COLONIES/mL ESCHERICHIA COLI 10,000 COLONIES/mL KLEBSIELLA PNEUMONIAE    Report Status 11/23/2023 FINAL  Final   Organism ID, Bacteria ESCHERICHIA COLI (A)  Final   Organism ID, Bacteria KLEBSIELLA PNEUMONIAE (A)  Final      Susceptibility   Escherichia coli - MIC*    AMPICILLIN 8 SENSITIVE Sensitive     CEFAZOLIN <=4 SENSITIVE Sensitive     CEFEPIME <=0.12 SENSITIVE Sensitive     CEFTRIAXONE  <=0.25 SENSITIVE Sensitive     CIPROFLOXACIN  <=0.25 SENSITIVE Sensitive     GENTAMICIN 2 SENSITIVE Sensitive     IMIPENEM 1 SENSITIVE Sensitive     NITROFURANTOIN <=16 SENSITIVE Sensitive     TRIMETH/SULFA <=20 SENSITIVE Sensitive     AMPICILLIN/SULBACTAM <=2 SENSITIVE  Sensitive     PIP/TAZO <=4 SENSITIVE Sensitive ug/mL    * >=100,000 COLONIES/mL ESCHERICHIA COLI   Klebsiella pneumoniae - MIC*    AMPICILLIN >=32 RESISTANT Resistant     CEFAZOLIN <=4 SENSITIVE Sensitive     CEFEPIME <=0.12 SENSITIVE Sensitive     CEFTRIAXONE  <=0.25 SENSITIVE Sensitive     CIPROFLOXACIN  <=0.25 SENSITIVE Sensitive     GENTAMICIN <=1 SENSITIVE Sensitive     IMIPENEM 0.5 SENSITIVE Sensitive     NITROFURANTOIN 128 RESISTANT Resistant     TRIMETH/SULFA <=20 SENSITIVE Sensitive     AMPICILLIN/SULBACTAM 8 SENSITIVE Sensitive     PIP/TAZO <=4 SENSITIVE Sensitive ug/mL    * 10,000 COLONIES/mL KLEBSIELLA PNEUMONIAE  Urine Culture     Status: Abnormal   Collection Time: 11/20/23  3:41 PM   Specimen: Urine, Cystoscope  Result Value Ref Range Status   Specimen Description   Final    CYSTOSCOPY Performed at [facility], [address] [place], [place] [address]    Special Requests   Final    NONE Performed at [facility], [address] [place], [place] [address]    Culture (A)  Final    20,000 COLONIES/mL ESCHERICHIA COLI SUSCEPTIBILITIES PERFORMED ON PREVIOUS CULTURE WITHIN THE LAST 5 DAYS. Performed at [facility], [address], [place], [place] [address]    Report  Status 11/22/2023 FINAL  Final     Labs: CBC: Recent Labs  Lab 11/20/23 0844 11/21/23 0231 11/22/23 0338 11/23/23 0357  WBC 5.3 22.5* 21.6* 14.4*  HGB 12.9 11.8* 11.3* 11.3*  HCT 40.5 36.4 35.2* 35.5*  MCV 70.2* 69.9* 69.4* 68.4*  PLT 133* 119* 110* 123*   Basic Metabolic Panel: Recent Labs  Lab 11/20/23 0844 11/21/23 0231 11/22/23 0338 11/23/23 0357  NA 140 142 141 143  K 3.5 4.4 3.5 3.8  CL 108 113* 112* 113*  CO2 21* 19* 21* 22  GLUCOSE 140* 136* 107* 90  BUN 25* 26* 22 16  CREATININE 1.14* 1.25* 0.70 0.78  CALCIUM  8.8* 8.1* 8.1* 8.3*  MG  --  1.8 2.0 2.1  PHOS 1.4* 3.2 1.9* 3.1   Liver Function Tests: Recent Labs  Lab 11/20/23 0844 11/21/23 0231  AST 36 27  ALT 19 18  ALKPHOS 72 46  BILITOT 1.8* 1.4*  PROT 6.4* 5.5*  ALBUMIN 3.6 2.8*   Recent Labs  Lab 11/20/23 0844  LIPASE 30   No results for input(s): AMMONIA in the last 168 hours. Cardiac Enzymes: No results for input(s): CKTOTAL, CKMB, CKMBINDEX, TROPONINI in the last 168 hours. BNP (last 3 results) No results for input(s): BNP in the last 8760 hours. CBG: Recent Labs  Lab 11/22/23 0736 11/22/23 1148 11/22/23 1556 11/22/23 2107 11/23/23 0822  GLUCAP 77 85 123* 97 104*    Time spent: 35 minutes  Signed:  Elvan Sor  [facility] 11/23/2023 10:35 AM

## 2023-11-23 NOTE — Telephone Encounter (Signed)
 Pt needs to be scheduled for a hospital follow up

## 2023-11-24 ENCOUNTER — Telehealth: Payer: Self-pay

## 2023-11-24 NOTE — Transitions of Care (Post Inpatient/ED Visit) (Signed)
   11/24/2023  Name: Mary Chen MRN: [id number] DOB: 1955-12-25  Today's TOC FU Call Status: Today's TOC FU Call Status:: Successful TOC FU Call Completed TOC FU Call Complete Date: 11/24/23 Patient's Name and Date of Birth confirmed.  Transition Care Management Follow-up Telephone Call Date of Discharge: 11/23/23 Discharge Facility: [facility]) Type of Discharge: Inpatient Admission Primary Inpatient Discharge Diagnosis:: Flank Pain How have you been since you were released from the hospital?: Better Any questions or concerns?: No  Items Reviewed: Did you receive and understand the discharge instructions provided?:  (Reviewed discharge instruction with patient) Medications obtained,verified, and reconciled?: Yes (Medications Reviewed) Any new allergies since your discharge?: No Dietary orders reviewed?: Yes Type of Diet Ordered:: Carb Modified Do you have support at home?: Yes People in Home [RPT]: sibling(s) Name of Support/Comfort Primary Source: Earlene Thaxton  Medications Reviewed Today: Medications Reviewed Today     Reviewed by Cybill Uriegas E, RN (Registered Nurse) on 11/24/23 at 1035  Med List Status: <None>   Medication Order Taking? Sig Documenting Provider Last Dose Status Informant  ciprofloxacin  (CIPRO ) 500 MG tablet [id number] Yes Take 1 tablet (500 mg total) by mouth 2 (two) times daily for 10 days. Von Bellis, MD  Active   cyanocobalamin  1000 MCG tablet [id number] Yes Take 1 tablet (1,000 mcg total) by mouth daily. Von Bellis, MD  Active   rosuvastatin  (CRESTOR ) 10 MG tablet [id number] Yes TAKE 1 TABLET BY MOUTH TWICE A WEEK Marylynn Verneita CROME, MD  Active Self, Pharmacy Records  tamsulosin  (FLOMAX ) 0.4 MG CAPS capsule [id number] Yes Take 1 capsule (0.4 mg total) by mouth daily. Von Bellis, MD  Active   telmisartan  (MICARDIS ) 20 MG tablet [id number] Yes Take 1 tablet (20 mg total) by mouth at bedtime. Marylynn Verneita CROME, MD   Active Self, Pharmacy Records  Vitamin D , Ergocalciferol , (DRISDOL ) 1.25 MG (50000 UNIT) CAPS capsule [id number] Yes Take 1 capsule (50,000 Units total) by mouth every 7 (seven) days. Marylynn Verneita CROME, MD  Active Self, Pharmacy Records            Home Care and Equipment/Supplies: Were Home Health Services Ordered?: No Any new equipment or medical supplies ordered?: No  Functional Questionnaire: Do you need assistance with bathing/showering or dressing?: No Do you need assistance with meal preparation?: No Do you need assistance with eating?: No Do you have difficulty maintaining continence: No Do you need assistance with getting out of bed/getting out of a chair/moving?: No Do you have difficulty managing or taking your medications?: No  Follow up appointments reviewed: PCP Follow-up appointment confirmed?: Yes Date of PCP follow-up appointment?: 12/05/23 Follow-up Provider: Dr. Hans Baptist Specialist [facility] Follow-up appointment confirmed?: No Reason Specialist Follow-Up Not Confirmed:  (patient states she will call today and schedule follow up appointment with urologist.) Do you need transportation to your follow-up appointment?: No Do you understand care options if your condition(s) worsen?: Yes-patient verbalized understanding  SDOH Interventions Today    Flowsheet Row Most Recent Value  SDOH Interventions   Food Insecurity Interventions Intervention Not Indicated  Housing Interventions Intervention Not Indicated  Transportation Interventions Intervention Not Indicated  Utilities Interventions Intervention Not Indicated    Arvin Seip RN, BSN, CCM [facility], Population Health Case Manager Phone: [phone]

## 2023-11-24 NOTE — Patient Instructions (Signed)
 Visit Information  Thank you for taking time to visit with me today. Please don't hesitate to contact me if I can be of assistance to you.  Patient instructions: Drink plenty of water Take medications as prescribed and manage pain Use heating pad or warm bath on lower back/ side to relieve spasms Follow up with your provider as recommended Call and schedule follow up visit with urologist.  Go to the ED or call your provider for fever/chills/ nausea / vomiting/ severe unrelenting pain, blood in urine, inability to urinate.   Patient verbalizes understanding of instructions and care plan provided today and agrees to view in MyChart. Active MyChart status and patient understanding of how to access instructions and care plan via MyChart confirmed with patient.     The patient has been provided with contact information for the care management team and has been advised to call with any health related questions or concerns.   Please call the care guide team at [phone] if you need to cancel or reschedule your appointment.   Please call the Suicide and Crisis Lifeline: 988 call 1-800-273-TALK (toll free, 24 hour hotline) if you are experiencing a Mental Health or Behavioral Health Crisis or need someone to talk to.  Arvin Seip RN, BSN, CCM [employer], Population Health Case Manager Phone: [phone]

## 2023-11-29 ENCOUNTER — Other Ambulatory Visit: Payer: Self-pay | Admitting: Internal Medicine

## 2023-12-05 ENCOUNTER — Encounter: Payer: Self-pay | Admitting: Internal Medicine

## 2023-12-05 ENCOUNTER — Inpatient Hospital Stay: Admitting: Internal Medicine

## 2023-12-05 ENCOUNTER — Ambulatory Visit: Admitting: Internal Medicine

## 2023-12-05 VITALS — BP 122/78 | HR 65 | Temp 98.5°F | Ht 66.0 in | Wt 217.6 lb

## 2023-12-05 DIAGNOSIS — I10 Essential (primary) hypertension: Secondary | ICD-10-CM

## 2023-12-05 DIAGNOSIS — E1169 Type 2 diabetes mellitus with other specified complication: Secondary | ICD-10-CM

## 2023-12-05 DIAGNOSIS — E669 Obesity, unspecified: Secondary | ICD-10-CM

## 2023-12-05 DIAGNOSIS — N201 Calculus of ureter: Secondary | ICD-10-CM

## 2023-12-05 DIAGNOSIS — D696 Thrombocytopenia, unspecified: Secondary | ICD-10-CM

## 2023-12-05 DIAGNOSIS — E1159 Type 2 diabetes mellitus with other circulatory complications: Secondary | ICD-10-CM

## 2023-12-05 DIAGNOSIS — I152 Hypertension secondary to endocrine disorders: Secondary | ICD-10-CM

## 2023-12-05 DIAGNOSIS — E785 Hyperlipidemia, unspecified: Secondary | ICD-10-CM

## 2023-12-05 DIAGNOSIS — E538 Deficiency of other specified B group vitamins: Secondary | ICD-10-CM

## 2023-12-05 LAB — COMPREHENSIVE METABOLIC PANEL WITH GFR
ALT: 12 U/L (ref 0–35)
AST: 13 U/L (ref 0–37)
Albumin: 3.9 g/dL (ref 3.5–5.2)
Alkaline Phosphatase: 63 U/L (ref 39–117)
BUN: 15 mg/dL (ref 6–23)
CO2: 27 meq/L (ref 19–32)
Calcium: 9.2 mg/dL (ref 8.4–10.5)
Chloride: 105 meq/L (ref 96–112)
Creatinine, Ser: 0.79 mg/dL (ref 0.40–1.20)
GFR: 76.7 mL/min (ref >60.00)
Glucose, Bld: 93 mg/dL (ref 70–99)
Potassium: 3.8 meq/L (ref 3.5–5.1)
Sodium: 140 meq/L (ref 135–145)
Total Bilirubin: 1 mg/dL (ref 0.2–1.2)
Total Protein: 6.9 g/dL (ref 6.0–8.3)

## 2023-12-05 LAB — CBC WITH DIFFERENTIAL/PLATELET
Basophils Absolute: 0 K/uL (ref 0.0–0.1)
Basophils Relative: 0.6 % (ref 0.0–3.0)
Eosinophils Absolute: 0.1 K/uL (ref 0.0–0.7)
Eosinophils Relative: 1.4 % (ref 0.0–5.0)
HCT: 39.1 % (ref 36.0–46.0)
Hemoglobin: 12.2 g/dL (ref 12.0–15.0)
Lymphocytes Relative: 37.4 % (ref 12.0–46.0)
Lymphs Abs: 1.8 K/uL (ref 0.7–4.0)
MCHC: 31.2 g/dL (ref 30.0–36.0)
MCV: 70 fl — ABNORMAL LOW (ref 78.0–100.0)
Monocytes Absolute: 0.4 K/uL (ref 0.1–1.0)
Monocytes Relative: 8.9 % (ref 3.0–12.0)
Neutro Abs: 2.4 K/uL (ref 1.4–7.7)
Neutrophils Relative %: 51.7 % (ref 43.0–77.0)
Platelets: 348 K/uL (ref 150.0–400.0)
RBC: 5.58 Mil/uL — ABNORMAL HIGH (ref 3.87–5.11)
RDW: 17.2 % — ABNORMAL HIGH (ref 11.5–15.5)
WBC: 4.7 K/uL (ref 4.0–10.5)

## 2023-12-05 NOTE — Assessment & Plan Note (Signed)
-   This problem is chronic and stable -Will continue Crestor  10 mg daily -Patient will be due for repeat lipid panel at her follow-up visit

## 2023-12-05 NOTE — Assessment & Plan Note (Signed)
-   Patient was noted to be thrombocytopenic in the hospital with platelets on 123 -Likely secondary to underlying UTI and left obstructive uropathy -Repeat a CBC today - No further workup at this time

## 2023-12-05 NOTE — Patient Instructions (Signed)
-   It was a pleasure meeting you today -I am glad that you are feeling better.  Please complete your course of antibiotics and continue to take the Flomax  and Ditropan  till you see the urologist. -You were noted to have a low platelet count in the hospital.  We will check your blood counts again today. -Will also check your kidney and liver function today -Please recheck your vitamin B12 and your urine protein on your follow-up with Dr. Marylynn in September -Please contact us  with any questions or concerns

## 2023-12-05 NOTE — Progress Notes (Signed)
 Acute Office Visit  Subjective:     Patient ID: Mary Chen, female    DOB: 1956-03-20, 68 y.o.   MRN: [id number]  Chief Complaint  Patient presents with   Hospitalization Follow-up    HPI Patient is in today for follow-up from recent hospitalization.  Patient was recently admitted to the hospital from July 6 - July 9 for left flank pain and was noted to have left obstructive uropathy on imaging with a 7 mm distal left ureteral stone.  Patient was seen by urology in the hospital and had a left ureteral stent insertion.  Patient also noted to have a urinary tract infection and was discharged home on ciprofloxacin  to complete a 10-day course.  Patient also had an AKI which resolved in the hospital.    She also noted to be thrombocytopenic with platelet count of 123 and have vitamin B12 deficiency.  Patient was started on vitamin B12 supplementation in the hospital.   She had a TOC phone call on July 10 and was scheduled for hospital follow-up today.  Patient states that she feels much better and denies any flank pain currently.  States that she occasionally feels stone moving but denies any other complaints.  She states that she only occasionally uses her Ditropan  and Flomax  as she feels she does not need it  Medication list was reviewed with the patient.  Patient is taking her vitamin B12 supplementation.  She is only intermittently compliant with her Ditropan  and Flomax .  She is compliant with the rest of her medications.  Review of Systems  Constitutional: Negative.   HENT: Negative.    Respiratory: Negative.    Cardiovascular: Negative.   Gastrointestinal:  Negative for abdominal pain and vomiting.  Genitourinary:  Negative for dysuria, flank pain, frequency, hematuria and urgency.  Musculoskeletal: Negative.   Neurological: Negative.   Psychiatric/Behavioral: Negative.          Objective:    BP 122/78   Pulse 65   Temp 98.5 F (36.9 C)   Ht 5' 6 (1.676 m)    Wt 217 lb 9.6 oz (98.7 kg)   SpO2 97%   BMI 35.12 kg/m    Physical Exam Constitutional:      Appearance: Normal appearance.  HENT:     Head: Normocephalic and atraumatic.  Cardiovascular:     Rate and Rhythm: Normal rate and regular rhythm.     Heart sounds: Normal heart sounds.  Pulmonary:     Breath sounds: Normal breath sounds. No wheezing or rales.  Abdominal:     General: Bowel sounds are normal. There is no distension.     Palpations: Abdomen is soft.     Tenderness: There is no abdominal tenderness. There is no right CVA tenderness or left CVA tenderness.  Musculoskeletal:        General: No swelling or tenderness.  Neurological:     General: No focal deficit present.     Mental Status: She is alert.  Psychiatric:        Mood and Affect: Mood normal.        Behavior: Behavior normal.     No results found for any visits on 12/05/23.      Assessment & Plan:   Problem List Items Addressed This Visit       Cardiovascular and Mediastinum   Essential hypertension   - This problem is chronic and stable -Patient's telmisartan  was held during her hospitalization secondary to soft blood pressures but resumed  on discharge -Patient's blood pressure today was 122/78 -Will continue telmisartan  20 mg daily -Will follow-up CMP -No further workup at this time      Obesity, diabetes, and hypertension syndrome (HCC)   - Patient's diabetes is well-controlled off medication currently -She was prescribed Mounjaro  in October but does not currently appear to be on this medication -Her A1c in the hospital was 6.1 -Patient will need a UACR at her next visit -No further workup at this time        Endocrine   Dyslipidemia associated with type 2 diabetes mellitus (HCC)   - This problem is chronic and stable -Will continue Crestor  10 mg daily -Patient will be due for repeat lipid panel at her follow-up visit        Genitourinary   Ureterolithiasis - Primary   - Patient  presented to hospital on July 6 with left flank pain and was found to have an obstructive left ureteral stone status post left ureteral stent placement by urology -Patient had an AKI as well which resolved while in the hospital -She was also found to have UTI with pansensitive E. coli and was treated with ciprofloxacin  to complete a 10-day course -Patient states that she feels well today and denies any complaints -No CVA or abdominal tenderness noted on exam -She will follow-up with urology.  She called her office and is awaiting callback to schedule this appointment -No further workup at this time.      Relevant Orders   Comp Met (CMET)     Hematopoietic and Hemostatic   Thrombocytopenia (HCC)   - Patient was noted to be thrombocytopenic in the hospital with platelets on 123 -Likely secondary to underlying UTI and left obstructive uropathy -Repeat a CBC today - No further workup at this time      Relevant Orders   CBC with Differential/Platelet     Other   Hyperbilirubinemia   - Patient was noted to have mild hyperbilirubinemia at her recent hospitalization likely secondary to her UTI and obstructive uropathy -Will recheck a CMP today -No further workup at this time      Relevant Orders   Comp Met (CMET)   Vitamin B12 deficiency   - Patient was noted to have vitamin B12 deficiency at her recent hospitalization -She was given an IM vitamin B12 injection and started on oral supplementation of vitamin B12 -She will recheck her vitamin B12 levels at her follow-up appointment with her PCP -No further workup at this time       No orders of the defined types were placed in this encounter.   No follow-ups on file.  Shaquasia Caponigro, MD

## 2023-12-05 NOTE — Assessment & Plan Note (Addendum)
-   Patient presented to hospital on July 6 with left flank pain and was found to have an obstructive left ureteral stone status post left ureteral stent placement by urology -Patient had an AKI as well which resolved while in the hospital -She was also found to have UTI with pansensitive E. coli and was treated with ciprofloxacin  to complete a 10-day course -Patient states that she feels well today and denies any complaints -No CVA or abdominal tenderness noted on exam -She will follow-up with urology.  She called her office and is awaiting callback to schedule this appointment -Patient encouraged to continue Flomax  and Ditropan  till her follow-up with urology -No further workup at this time.

## 2023-12-05 NOTE — Assessment & Plan Note (Signed)
-   Patient was noted to have mild hyperbilirubinemia at her recent hospitalization likely secondary to her UTI and obstructive uropathy -Will recheck a CMP today -No further workup at this time

## 2023-12-05 NOTE — Assessment & Plan Note (Signed)
-   Patient was noted to have vitamin B12 deficiency at her recent hospitalization -She was given an IM vitamin B12 injection and started on oral supplementation of vitamin B12 -She will recheck her vitamin B12 levels at her follow-up appointment with her PCP -No further workup at this time

## 2023-12-05 NOTE — Assessment & Plan Note (Signed)
-   This problem is chronic and stable -Patient's telmisartan  was held during her hospitalization secondary to soft blood pressures but resumed on discharge -Patient's blood pressure today was 122/78 -Will continue telmisartan  20 mg daily -Will follow-up CMP -No further workup at this time

## 2023-12-05 NOTE — Assessment & Plan Note (Signed)
-   Patient's diabetes is well-controlled off medication currently -She was prescribed Mounjaro  in October but does not currently appear to be on this medication -Her A1c in the hospital was 6.1 -Patient will need a UACR at her next visit -No further workup at this time

## 2023-12-06 ENCOUNTER — Ambulatory Visit: Payer: Self-pay | Admitting: Internal Medicine

## 2023-12-13 ENCOUNTER — Encounter: Payer: Self-pay | Admitting: Urology

## 2023-12-16 ENCOUNTER — Other Ambulatory Visit: Payer: Self-pay | Admitting: Urology

## 2023-12-16 DIAGNOSIS — N201 Calculus of ureter: Secondary | ICD-10-CM

## 2023-12-19 ENCOUNTER — Telehealth: Payer: Self-pay

## 2023-12-19 ENCOUNTER — Other Ambulatory Visit: Payer: Self-pay

## 2023-12-19 DIAGNOSIS — N201 Calculus of ureter: Secondary | ICD-10-CM

## 2023-12-19 DIAGNOSIS — N200 Calculus of kidney: Secondary | ICD-10-CM

## 2023-12-19 NOTE — Progress Notes (Signed)
   [facility] Surgical Posting Form  Surgery Date: Date: 12/29/2023  Surgeon: Dr. Glendia Barba, MD  Inpt ( No  )   Outpt (Yes)   Obs ( No  )   Diagnosis: N20.1 Left Ureteral Stone, N20.0 Left Nephrolithiasis  -CPT: [phone]  Surgery: Left Ureteroscopy with Laser Lithotripsy and Stent Placement  Stop Anticoagulations: No  Cardiac/Medical/Pulmonary Clearance needed: no  *Orders entered into EPIC  Date: 12/19/23   *Case booked in [place]  Date: 12/19/23  *Notified pt of Surgery: Date: 12/19/23  PRE-OP UA & CX: yes, will obtain in clinic on 12/20/2023  *Placed into Prior Authorization Work [place] Date: 12/19/23  Assistant/laser/rep:No

## 2023-12-19 NOTE — Telephone Encounter (Signed)
 Per Dr. Twylla, Patient is to be scheduled for Left Ureteroscopy with Laser Lithotripsy and Stent Placement   Mary Chen was contacted and possible surgical dates were discussed, Thursday August 14th, 2025 was agreed upon for surgery.   Patient was instructed that Dr. Twylla will require them to provide a pre-op UA & CX prior to surgery. This was ordered and scheduled drop off appointment was made for 12/20/2023.    Patient was directed to call [phone] between 1-3pm the day before surgery to find out surgical arrival time.  Instructions were given not to eat or drink from midnight on the night before surgery and have a driver for the day of surgery. On the surgery day patient was instructed to enter through the Medical Mall entrance of [facility] report the Same Day Surgery desk.   Pre-Admit Testing will be in contact via phone to set up an interview with the anesthesia team to review your history and medications prior to surgery.   Reminder of this information was sent via MyChart to the patient.

## 2023-12-19 NOTE — Addendum Note (Signed)
 Addended by: RUTHER SETTER A on: 12/19/2023 03:49 PM   Modules accepted: Orders

## 2023-12-19 NOTE — Progress Notes (Signed)
 Surgical Physician Order Form [facility]  Dr. Glendia Barba, MD  * Scheduling expectation : Next Available  *Length of Case: 60 minutes  *Clearance needed: no  *Anticoagulation Instructions: N/A  *Aspirin Instructions: N/A  *Post-op visit Date/Instructions:  1 week cysto stent removal  *Diagnosis: Left ureteral calculus, left nephrolithiasis  *Procedure: Left  Ureteroscopy w/laser lithotripsy & stent exchange (47643)   Additional orders: N/A  -Admit type: OUTpatient  -Anesthesia: General  -VTE Prophylaxis Standing Order SCD's       Other:   -Standing Lab Orders Per Anesthesia    Lab other: UA&Urine Culture  -Standing Test orders EKG/Chest x-ray per Anesthesia       Test other:   - Medications:  Ceftriaxone (Rocephin ) 1gm IV  -Other orders:  N/A

## 2023-12-20 ENCOUNTER — Other Ambulatory Visit

## 2023-12-20 DIAGNOSIS — N200 Calculus of kidney: Secondary | ICD-10-CM | POA: Diagnosis not present

## 2023-12-20 DIAGNOSIS — N201 Calculus of ureter: Secondary | ICD-10-CM

## 2023-12-20 LAB — MICROSCOPIC EXAMINATION: RBC, Urine: >30 /HPF — AB (ref 0–2)

## 2023-12-20 LAB — URINALYSIS, COMPLETE
Bilirubin, UA: NEGATIVE
Glucose, UA: NEGATIVE
Ketones, UA: NEGATIVE
Nitrite, UA: NEGATIVE
Specific Gravity, UA: 1.025 (ref 1.005–1.030)
Urobilinogen, Ur: 0.2 mg/dL (ref 0.2–1.0)
pH, UA: 6 (ref 5.0–7.5)

## 2023-12-23 LAB — CULTURE, URINE COMPREHENSIVE

## 2023-12-27 ENCOUNTER — Other Ambulatory Visit: Payer: Self-pay

## 2023-12-27 ENCOUNTER — Encounter
Admission: RE | Admit: 2023-12-27 | Discharge: 2023-12-27 | Disposition: A | Discharge status: Home / Self Care | Source: Ambulatory Visit | Attending: Urology | Admitting: Urology

## 2023-12-27 VITALS — Ht 67.0 in | Wt 200.0 lb

## 2023-12-27 DIAGNOSIS — E1169 Type 2 diabetes mellitus with other specified complication: Secondary | ICD-10-CM

## 2023-12-27 DIAGNOSIS — I10 Essential (primary) hypertension: Secondary | ICD-10-CM

## 2023-12-27 HISTORY — DX: Prediabetes: R73.03

## 2023-12-27 HISTORY — DX: Essential (primary) hypertension: I10

## 2023-12-27 NOTE — Patient Instructions (Signed)
 Your procedure is scheduled on: Thursday 12/29/23 Report to the Registration Desk on the 1st floor of the Medical Mall. To find out your arrival time, please call [phone] between 1PM - 3PM on: Wednesday 12/28/23 If your arrival time is 6:00 am, do not arrive before that time as the Medical Mall entrance doors do not open until 6:00 am.  REMEMBER: Instructions that are not followed completely may result in serious medical risk, up to and including death; or upon the discretion of your surgeon and anesthesiologist your surgery may need to be rescheduled.  Do not eat food or drink any liquids after midnight the night before surgery.  No gum chewing or hard candies.  One week prior to surgery: Stop Anti-inflammatories (NSAIDS) such as Advil, Aleve, Ibuprofen, Motrin, Naproxen, Naprosyn and Aspirin based products such as Excedrin, Goody's Powder, BC Powder.  You may however, continue to take Tylenol  if needed for pain up until the day of surgery.  Stop ALL OVER THE COUNTER supplements and vitamins until after surgery.  Continue taking all of your other prescription medications up until the day of surgery.  ON THE DAY OF SURGERY ONLY TAKE THESE MEDICATIONS WITH SIPS OF WATER:  tamsulosin  (FLOMAX ) 0.4 MG CAPS capsule   No Alcohol for 24 hours before or after surgery.  No Smoking including e-cigarettes for 24 hours before surgery.  No chewable tobacco products for at least 6 hours before surgery.  No nicotine patches on the day of surgery.  Do not use any recreational drugs for at least a week (preferably 2 weeks) before your surgery.  Please be advised that the combination of cocaine and anesthesia may have negative outcomes, up to and including death. If you test positive for cocaine, your surgery will be cancelled.  On the morning of surgery brush your teeth with toothpaste and water, you may rinse your mouth with mouthwash if you wish. Do not swallow any toothpaste or  mouthwash.  Shower the morning of your procedure.  Do not shave body hair from the neck down 48 hours before surgery.  Do not wear lotions, powders, or perfumes.  Wear comfortable clothing (specific to your surgery type) to the hospital.  Do not wear jewelry, make-up, hairpins, clips or nail polish.  For welded (permanent) jewelry: bracelets, anklets, waist bands, etc.  Please have this removed prior to surgery.  If it is not removed, there is a chance that hospital personnel will need to cut it off on the day of surgery.  Contact lenses, hearing aids and dentures may not be worn into surgery. Bring a case for your glases  Do not bring valuables to the hospital. [facility] is not responsible for any missing/lost belongings or valuables.   Notify your doctor if there is any change in your medical condition (cold, fever, infection).  After surgery, you can help prevent lung complications by doing breathing exercises.  Take deep breaths and cough every 1-2 hours. Your doctor may order a device called an Incentive Spirometer to help you take deep breaths.  If you are being discharged the day of surgery, you will not be allowed to drive home. You will need a responsible individual to drive you home and stay with you for 24 hours after surgery.   Please call the Pre-admissions Testing Dept. at [phone] if you have any questions about these instructions.  Surgery Visitation Policy:  Patients having surgery or a procedure may have two visitors.  Children under the age of 60  must have an adult with them who is not the patient.  Merchandiser, retail to address health-related social needs:  [email or web address]

## 2023-12-28 ENCOUNTER — Encounter
Admission: RE | Admit: 2023-12-28 | Discharge: 2023-12-28 | Disposition: A | Discharge status: Home / Self Care | Source: Ambulatory Visit | Attending: Urology | Admitting: Urology

## 2023-12-28 DIAGNOSIS — I10 Essential (primary) hypertension: Secondary | ICD-10-CM | POA: Diagnosis not present

## 2023-12-28 DIAGNOSIS — E1169 Type 2 diabetes mellitus with other specified complication: Secondary | ICD-10-CM | POA: Diagnosis not present

## 2023-12-28 DIAGNOSIS — Z01818 Encounter for other preprocedural examination: Secondary | ICD-10-CM | POA: Diagnosis not present

## 2023-12-28 DIAGNOSIS — E785 Hyperlipidemia, unspecified: Secondary | ICD-10-CM | POA: Diagnosis not present

## 2023-12-28 DIAGNOSIS — Z01810 Encounter for preprocedural cardiovascular examination: Secondary | ICD-10-CM | POA: Diagnosis not present

## 2023-12-28 MED ORDER — SODIUM CHLORIDE 0.9 % IV SOLN
1.0000 g | INTRAVENOUS | Status: AC
  Administered 2023-12-29: 1 g via INTRAVENOUS
  Filled 2023-12-28: qty 1

## 2023-12-28 MED ORDER — SODIUM CHLORIDE 0.9 % IV SOLN: INTRAVENOUS | Status: DC

## 2023-12-28 MED ORDER — ORAL CARE MOUTH RINSE: 15.0000 mL | Freq: Once | OROMUCOSAL | Status: AC

## 2023-12-28 MED ORDER — CHLORHEXIDINE GLUCONATE 0.12 % MT SOLN
15.0000 mL | Freq: Once | OROMUCOSAL | Status: AC
  Administered 2023-12-29: 15 mL via OROMUCOSAL

## 2023-12-29 ENCOUNTER — Ambulatory Visit: Payer: Self-pay | Admitting: Urgent Care

## 2023-12-29 ENCOUNTER — Encounter: Payer: Self-pay | Admitting: Urology

## 2023-12-29 ENCOUNTER — Ambulatory Visit
Admission: RE | Admit: 2023-12-29 | Discharge: 2023-12-29 | Disposition: A | Discharge status: Home / Self Care | Attending: Urology | Admitting: Urology

## 2023-12-29 ENCOUNTER — Other Ambulatory Visit: Payer: Self-pay

## 2023-12-29 ENCOUNTER — Encounter
Admission: RE | Disposition: A | Discharge status: Home / Self Care | Payer: Self-pay | Source: Home / Self Care | Attending: Urology

## 2023-12-29 ENCOUNTER — Ambulatory Visit: Admitting: Certified Registered"

## 2023-12-29 ENCOUNTER — Ambulatory Visit

## 2023-12-29 DIAGNOSIS — Z6831 Body mass index (BMI) 31.0-31.9, adult: Secondary | ICD-10-CM | POA: Insufficient documentation

## 2023-12-29 DIAGNOSIS — N200 Calculus of kidney: Secondary | ICD-10-CM

## 2023-12-29 DIAGNOSIS — I10 Essential (primary) hypertension: Secondary | ICD-10-CM | POA: Diagnosis not present

## 2023-12-29 DIAGNOSIS — R7303 Prediabetes: Secondary | ICD-10-CM | POA: Insufficient documentation

## 2023-12-29 DIAGNOSIS — N201 Calculus of ureter: Secondary | ICD-10-CM

## 2023-12-29 DIAGNOSIS — E669 Obesity, unspecified: Secondary | ICD-10-CM | POA: Insufficient documentation

## 2023-12-29 DIAGNOSIS — N202 Calculus of kidney with calculus of ureter: Secondary | ICD-10-CM | POA: Diagnosis not present

## 2023-12-29 DIAGNOSIS — Z86711 Personal history of pulmonary embolism: Secondary | ICD-10-CM | POA: Diagnosis not present

## 2023-12-29 HISTORY — PX: CYSTOSCOPY/URETEROSCOPY/HOLMIUM LASER/STENT PLACEMENT: SHX6546

## 2023-12-29 SURGERY — CYSTOSCOPY/URETEROSCOPY/HOLMIUM LASER/STENT PLACEMENT
Anesthesia: General | Laterality: Left

## 2023-12-29 MED ORDER — FENTANYL CITRATE (PF) 100 MCG/2ML IJ SOLN
INTRAMUSCULAR | Status: AC
  Filled 2023-12-29: qty 2

## 2023-12-29 MED ORDER — FENTANYL CITRATE (PF) 100 MCG/2ML IJ SOLN
INTRAMUSCULAR | Status: AC
Start: 2023-12-29 — End: 2023-12-29
  Filled 2023-12-29: qty 2

## 2023-12-29 MED ORDER — FENTANYL CITRATE (PF) 100 MCG/2ML IJ SOLN
25.0000 ug | INTRAMUSCULAR | Status: DC | PRN
  Administered 2023-12-29 (×2): 50 ug via INTRAVENOUS

## 2023-12-29 MED ORDER — MIDAZOLAM HCL 2 MG/2ML IJ SOLN
INTRAMUSCULAR | Status: AC
  Filled 2023-12-29: qty 2

## 2023-12-29 MED ORDER — LIDOCAINE HCL (PF) 2 % IJ SOLN
INTRAMUSCULAR | Status: AC
Start: 2023-12-29 — End: 2023-12-29
  Filled 2023-12-29: qty 5

## 2023-12-29 MED ORDER — ONDANSETRON HCL 4 MG/2ML IJ SOLN
INTRAMUSCULAR | Status: AC
  Filled 2023-12-29: qty 2

## 2023-12-29 MED ORDER — DROPERIDOL 2.5 MG/ML IJ SOLN
0.6250 mg | Freq: Once | INTRAMUSCULAR | Status: AC | PRN
  Administered 2023-12-29: 0.625 mg via INTRAVENOUS

## 2023-12-29 MED ORDER — PROPOFOL 10 MG/ML IV BOLUS
INTRAVENOUS | Status: DC | PRN
  Administered 2023-12-29: 150 mg via INTRAVENOUS
  Administered 2023-12-29: 50 mg via INTRAVENOUS

## 2023-12-29 MED ORDER — DEXAMETHASONE SODIUM PHOSPHATE 10 MG/ML IJ SOLN
INTRAMUSCULAR | Status: AC
  Filled 2023-12-29: qty 1

## 2023-12-29 MED ORDER — LIDOCAINE HCL (CARDIAC) PF 100 MG/5ML IV SOSY
PREFILLED_SYRINGE | INTRAVENOUS | Status: DC | PRN
  Administered 2023-12-29: 100 mg via INTRAVENOUS

## 2023-12-29 MED ORDER — ACETAMINOPHEN 10 MG/ML IV SOLN
INTRAVENOUS | Status: AC
Start: 2023-12-29 — End: 2023-12-29
  Filled 2023-12-29: qty 100

## 2023-12-29 MED ORDER — ACETAMINOPHEN 10 MG/ML IV SOLN
INTRAVENOUS | Status: DC | PRN
  Administered 2023-12-29: 1000 mg via INTRAVENOUS

## 2023-12-29 MED ORDER — PHENYLEPHRINE 80 MCG/ML (10ML) SYRINGE FOR IV PUSH (FOR BLOOD PRESSURE SUPPORT)
PREFILLED_SYRINGE | INTRAVENOUS | Status: AC
  Filled 2023-12-29: qty 10

## 2023-12-29 MED ORDER — HYDROCODONE-ACETAMINOPHEN 5-325 MG PO TABS
1.0000 | ORAL_TABLET | Freq: Four times a day (QID) | ORAL | 0 refills | Status: DC | PRN
  Filled 2023-12-29: qty 10, 3d supply, fill #0

## 2023-12-29 MED ORDER — PHENYLEPHRINE 80 MCG/ML (10ML) SYRINGE FOR IV PUSH (FOR BLOOD PRESSURE SUPPORT)
PREFILLED_SYRINGE | INTRAVENOUS | Status: DC | PRN
  Administered 2023-12-29: 80 ug via INTRAVENOUS
  Administered 2023-12-29: 160 ug via INTRAVENOUS
  Administered 2023-12-29: 80 ug via INTRAVENOUS
  Administered 2023-12-29: 160 ug via INTRAVENOUS

## 2023-12-29 MED ORDER — CHLORHEXIDINE GLUCONATE 0.12 % MT SOLN
OROMUCOSAL | Status: AC
  Filled 2023-12-29: qty 15

## 2023-12-29 MED ORDER — FENTANYL CITRATE (PF) 100 MCG/2ML IJ SOLN
INTRAMUSCULAR | Status: DC | PRN
  Administered 2023-12-29 (×2): 50 ug via INTRAVENOUS

## 2023-12-29 MED ORDER — DEXAMETHASONE SODIUM PHOSPHATE 10 MG/ML IJ SOLN
INTRAMUSCULAR | Status: DC | PRN
  Administered 2023-12-29: 10 mg via INTRAVENOUS

## 2023-12-29 MED ORDER — DROPERIDOL 2.5 MG/ML IJ SOLN
INTRAMUSCULAR | Status: AC
  Filled 2023-12-29: qty 2

## 2023-12-29 MED ORDER — PHENYLEPHRINE HCL-NACL 20-0.9 MG/250ML-% IV SOLN
INTRAVENOUS | Status: AC
  Filled 2023-12-29: qty 250

## 2023-12-29 MED ORDER — PROPOFOL 10 MG/ML IV BOLUS
INTRAVENOUS | Status: AC
  Filled 2023-12-29: qty 20

## 2023-12-29 MED ORDER — OXYCODONE HCL 5 MG/5ML PO SOLN: 5.0000 mg | Freq: Once | ORAL | Status: AC | PRN

## 2023-12-29 MED ORDER — PHENYLEPHRINE HCL-NACL 20-0.9 MG/250ML-% IV SOLN
INTRAVENOUS | Status: DC | PRN
Start: 2023-12-29 — End: 2023-12-29
  Administered 2023-12-29: 80 ug/min via INTRAVENOUS

## 2023-12-29 MED ORDER — ACETAMINOPHEN 10 MG/ML IV SOLN
1000.0000 mg | Freq: Once | INTRAVENOUS | Status: DC | PRN
Start: 2023-12-29 — End: 2023-12-29

## 2023-12-29 MED ORDER — IOHEXOL 180 MG/ML  SOLN
INTRAMUSCULAR | Status: DC | PRN
  Administered 2023-12-29: 10 mL

## 2023-12-29 MED ORDER — ONDANSETRON HCL 4 MG/2ML IJ SOLN
INTRAMUSCULAR | Status: DC | PRN
  Administered 2023-12-29: 4 mg via INTRAVENOUS

## 2023-12-29 MED ORDER — OXYBUTYNIN CHLORIDE 5 MG PO TABS
5.0000 mg | ORAL_TABLET | Freq: Three times a day (TID) | ORAL | 0 refills | Status: DC | PRN
  Filled 2023-12-29: qty 15, 5d supply, fill #0

## 2023-12-29 MED ORDER — OXYCODONE HCL 5 MG PO TABS
5.0000 mg | ORAL_TABLET | Freq: Once | ORAL | Status: AC | PRN
  Administered 2023-12-29: 5 mg via ORAL

## 2023-12-29 MED ORDER — OXYCODONE HCL 5 MG PO TABS
ORAL_TABLET | ORAL | Status: AC
  Filled 2023-12-29: qty 1

## 2023-12-29 MED ORDER — EPHEDRINE 5 MG/ML INJ
INTRAVENOUS | Status: AC
  Filled 2023-12-29: qty 5

## 2023-12-29 MED ORDER — SODIUM CHLORIDE 0.9 % IR SOLN
Status: DC | PRN
  Administered 2023-12-29: 1

## 2023-12-29 MED ORDER — MIDAZOLAM HCL 2 MG/2ML IJ SOLN
INTRAMUSCULAR | Status: DC | PRN
  Administered 2023-12-29: 2 mg via INTRAVENOUS

## 2023-12-29 MED ORDER — EPHEDRINE SULFATE-NACL 50-0.9 MG/10ML-% IV SOSY
PREFILLED_SYRINGE | INTRAVENOUS | Status: DC | PRN
  Administered 2023-12-29 (×2): 5 mg via INTRAVENOUS
  Administered 2023-12-29: 10 mg via INTRAVENOUS

## 2023-12-29 SURGICAL SUPPLY — 17 items
BAG DRAIN SIEMENS DORNER NS (MISCELLANEOUS) ×1 IMPLANT
BASKET ZERO TIP 1.9FR (BASKET) IMPLANT
BRUSH SCRUB EZ 4% CHG (MISCELLANEOUS) ×1 IMPLANT
DRAPE UTILITY 15X26 TOWEL STRL (DRAPES) ×1 IMPLANT
FIBER LASER MOSES 365 DFL (Laser) ×1 IMPLANT
GLOVE BIOGEL PI IND STRL 7.5 (GLOVE) ×1 IMPLANT
GOWN STRL REUS W/ TWL LRG LVL3 (GOWN DISPOSABLE) ×1 IMPLANT
GOWN STRL REUS W/ TWL XL LVL3 (GOWN DISPOSABLE) ×1 IMPLANT
GUIDEWIRE STR DUAL SENSOR (WIRE) ×1 IMPLANT
KIT TURNOVER CYSTO (KITS) ×1 IMPLANT
PACK CYSTO AR (MISCELLANEOUS) ×1 IMPLANT
SET CYSTO W/LG BORE CLAMP LF (SET/KITS/TRAYS/PACK) ×1 IMPLANT
SOL .9 NS 3000ML IRR UROMATIC (IV SOLUTION) ×1 IMPLANT
STENT URET 6FRX24 CONTOUR (STENTS) IMPLANT
SURGILUBE 2OZ TUBE FLIPTOP (MISCELLANEOUS) ×1 IMPLANT
VALVE UROSEAL ADJ ENDO (VALVE) IMPLANT
WATER STERILE IRR 500ML POUR (IV SOLUTION) ×1 IMPLANT

## 2023-12-29 NOTE — H&P (Signed)
 Urology H&P   Assessment/Recommendations:  1.  Left ureteral calculus Presents for definitive stone treatment with left ureteroscopy/laser lithotripsy and stent exchange The procedure has been discussed in detail including potential risk of bleeding, infection and ureteral injury.  All questions were answered and she desires to proceed   History of Present Illness: Mary Chen is a 68 y.o. female status post placement left ureteral stent 11/21/2023 for an obstructing 7 mm left distal ureteral calculus with infection.  She presents today for definitive stone treatment.  Past Medical History:  Diagnosis Date   Complication of anesthesia    a.) PONV   Fibrocystic disease of breast    History of kidney stones    Hypertension    Kidney stone    Ovarian cyst    PONV (postoperative nausea and vomiting)    Postoperative pulmonary embolism (HCC) 03/2008   a.) Factor V Leiden (-), LE ultrasound (-)   Pre-diabetes     Past Surgical History:  Procedure Laterality Date   ABDOMINAL HYSTERECTOMY     BREAST BIOPSY Left    neg   BREAST EXCISIONAL BIOPSY Left    neg   CESAREAN SECTION     x2   CYSTOSCOPY W/ URETERAL STENT PLACEMENT Left 11/20/2023   Procedure: CYSTOSCOPY, WITH RETROGRADE PYELOGRAM AND URETERAL STENT INSERTION;  Surgeon: Twylla Glendia BROCKS, MD;  Location: [facility] ORS;  Service: Urology;  Laterality: Left;   CYSTOSCOPY WITH STENT PLACEMENT Left 12/03/2018   Procedure: CYSTOSCOPY WITH STENT PLACEMENT;  Surgeon: Twylla Glendia BROCKS, MD;  Location: [facility] ORS;  Service: Urology;  Laterality: Left;   CYSTOSCOPY/URETEROSCOPY/HOLMIUM LASER/STENT PLACEMENT Left 01/23/2019   Procedure: CYSTOSCOPY/URETEROSCOPY//STENT EXCHANGE;  Surgeon: Twylla Glendia BROCKS, MD;  Location: [facility] ORS;  Service: Urology;  Laterality: Left;   STONE EXTRACTION WITH BASKET  01/23/2019   Procedure: STONE EXTRACTION WITH BASKET;  Surgeon: Twylla Glendia BROCKS, MD;  Location: [facility] ORS;  Service: Urology;;   TOTAL ABDOMINAL  HYSTERECTOMY W/ BILATERAL SALPINGOOPHORECTOMY  2009   for bilateral ovarian cysts and fibroid uterus    Home Medications:  Current Meds  Medication Sig   cyanocobalamin  1000 MCG tablet Take 1 tablet (1,000 mcg total) by mouth daily.   naproxen sodium (ALEVE) 220 MG tablet Take 220 mg by mouth daily as needed (pain).   rosuvastatin  (CRESTOR ) 10 MG tablet TAKE 1 TABLET BY MOUTH TWICE A WEEK (Patient taking differently: Take 10 mg by mouth 2 (two) times a week. TAKE 1 TABLET BY MOUTH TWICE A WEEK Sunday and Wednesday QHS)   [EXPIRED] tamsulosin  (FLOMAX ) 0.4 MG CAPS capsule Take 1 capsule (0.4 mg total) by mouth daily.   telmisartan  (MICARDIS ) 20 MG tablet TAKE 1 TABLET BY MOUTH AT BEDTIME   Vitamin D , Ergocalciferol , (DRISDOL ) 1.25 MG (50000 UNIT) CAPS capsule Take 1 capsule (50,000 Units total) by mouth every 7 (seven) days.    Allergies:  Allergies  Allergen Reactions   Phenergan  [Promethazine ] Nausea Only    Family History  Problem Relation Age of Onset   Cancer Mother        breast   Breast cancer Mother 105   Cancer Father 34       lung Ca mets to liver    Bipolar disorder Brother        self inficted GSW    Breast cancer Daughter     Social History:  reports that she has never smoked. She has never used smokeless tobacco. She reports that she does not drink alcohol and  does not use drugs.  ROS: No fever, chills, shortness of breath or chest pain  Physical Exam:  Vital signs in last 24 hours: Temp:  [97.2 F (36.2 C)] 97.2 F (36.2 C) (08/14 0754) Pulse Rate:  [79] 79 (08/14 0754) Resp:  [16] 16 (08/14 0754) BP: (127)/(79) 127/79 (08/14 0754) SpO2:  [97 %] 97 % (08/14 0754) Weight:  [90.7 kg] 90.7 kg (08/14 0754) Constitutional:  Alert and oriented, No acute distress HEENT: [place] AT Respiratory: Normal respiratory effort, lungs clear bilaterally Psychiatric: Normal mood and affect   Laboratory Data:  No results for input(s): WBC, HGB, HCT in the last 72  hours. No results for input(s): NA, K, CL, CO2, GLUCOSE, BUN, CREATININE, CALCIUM  in the last 72 hours. No results for input(s): LABPT, INR in the last 72 hours. No results for input(s): LABURIN in the last 72 hours. Results for orders placed or performed in visit on 12/20/23  CULTURE, URINE COMPREHENSIVE     Status: None   Collection Time: 12/20/23  9:49 AM   Specimen: Urine   UR  Result Value Ref Range Status   Urine Culture, Comprehensive Final report  Final   Organism ID, Bacteria Comment  Final    Comment: Mixed urogenital flora 1,000 Colonies/mL   Microscopic Examination     Status: Abnormal   Collection Time: 12/20/23  9:49 AM   Urine  Result Value Ref Range Status   WBC, UA 6-10 (A) 0 - 5 /hpf Final   RBC, Urine >30 (A) 0 - 2 /hpf Final   Epithelial Cells (non renal) 0-10 0 - 10 /hpf Final   Bacteria, UA Moderate (A) None seen/Few Final     12/29/2023, 8:10 AM  Glendia Barba,  MD

## 2023-12-29 NOTE — Anesthesia Postprocedure Evaluation (Signed)
 Anesthesia Post Note  Patient: Mary Chen  Procedure(s) Performed: CYSTOSCOPY/URETEROSCOPY/HOLMIUM LASER/STENT PLACEMENT (Left)  Patient location during evaluation: PACU Anesthesia Type: General Level of consciousness: awake and alert Pain management: pain level controlled Vital Signs Assessment: post-procedure vital signs reviewed and stable Respiratory status: spontaneous breathing, nonlabored ventilation and respiratory function stable Cardiovascular status: blood pressure returned to baseline and stable Postop Assessment: no apparent nausea or vomiting Anesthetic complications: no   No notable events documented.   Last Vitals:  Vitals:   12/29/23 1030 12/29/23 1050  BP: 104/76 112/75  Pulse: 73 69  Resp: 14 18  Temp: (!) 36.3 C 36.6 C  SpO2: 92% 95%    Last Pain:  Vitals:   12/29/23 1050  TempSrc: Temporal  PainSc: 0-No pain                 Camellia Merilee Louder

## 2023-12-29 NOTE — Transfer of Care (Signed)
 Immediate Anesthesia Transfer of Care Note  Patient: Mary Chen  Procedure(s) Performed: CYSTOSCOPY/URETEROSCOPY/HOLMIUM LASER/STENT PLACEMENT (Left)  Patient Location: PACU  Anesthesia Type:General  Level of Consciousness: drowsy  Airway & Oxygen Therapy: Patient Spontanous Breathing and Patient connected to face mask oxygen  Post-op Assessment: Report given to RN and Post -op Vital signs reviewed and stable  Post vital signs: Reviewed and stable  Last Vitals:  Vitals Value Taken Time  BP 131/91 12/29/23 09:15  Temp 36.3 C 12/29/23 09:15  Pulse 84 12/29/23 09:18  Resp 20 12/29/23 09:18  SpO2 99 % 12/29/23 09:18  Vitals shown include unfiled device data.  Last Pain:  Vitals:   12/29/23 0915  TempSrc:   PainSc: Asleep         Complications: No notable events documented.

## 2023-12-29 NOTE — Anesthesia Preprocedure Evaluation (Signed)
 Anesthesia Evaluation  Patient identified by MRN, date of birth, ID band Patient awake    Reviewed: Allergy & Precautions, NPO status , Patient's Chart, lab work & pertinent test results  History of Anesthesia Complications (+) PONV and history of anesthetic complications  Airway Mallampati: III   Neck ROM: Full    Dental  (+) Missing   Pulmonary neg pulmonary ROS   Pulmonary exam normal breath sounds clear to auscultation       Cardiovascular hypertension, Normal cardiovascular exam Rhythm:Regular Rate:Normal  Hx PE 2009, no longer on anticoagulation   Neuro/Psych negative neurological ROS     GI/Hepatic negative GI ROS,,,  Endo/Other  diabetes, Well Controlled, Type 2  Obesity   Renal/GU      Musculoskeletal   Abdominal  (+) + obese  Peds  Hematology negative hematology ROS (+)   Anesthesia Other Findings   Reproductive/Obstetrics                              Anesthesia Physical Anesthesia Plan  ASA: 2  Anesthesia Plan: General   Post-op Pain Management: Ofirmev  IV (intra-op)*   Induction: Intravenous  PONV Risk Score and Plan: 4 or greater and Ondansetron  and Dexamethasone   Airway Management Planned: LMA  Additional Equipment:   Intra-op Plan:   Post-operative Plan: Extubation in OR  Informed Consent: I have reviewed the patients History and Physical, chart, labs and discussed the procedure including the risks, benefits and alternatives for the proposed anesthesia with the patient or authorized representative who has indicated his/her understanding and acceptance.     Dental advisory given  Plan Discussed with: CRNA  Anesthesia Plan Comments:          Anesthesia Quick Evaluation

## 2023-12-29 NOTE — Op Note (Signed)
 Preoperative diagnosis:  Left ureteral calculus Left nephrolithiasis  Postoperative diagnosis:  Left ureteral calculus Left nephrolithiasis  Procedure:  Cystoscopy Left ureteroscopy and stone removal Ureteroscopic laser lithotripsy Left ureteral stent exchange (37F/24 cm) Left retrograde pyelography with interpretation  Surgeon: Glendia C. Rosaria Kubin, M.D.  Anesthesia: General  Complications: None  Intraoperative findings:  Cystoscopy: Bladder mucosa without solid or papillary lesions; UOs normal-appearing bilaterally.  Inflammatory changes left UO secondary to indwelling stent Ureteroscopy: Non-impacted left distal ureteral calculus.  3 mm upper and lower calyceal calculi Left retrograde pyelography post procedure showed no filling defects, stone fragments or contrast extravasation  EBL: Minimal  Specimens: None   Indication: Mary Chen is a 68 y.o.  female s/p left ureteral stent placement 11/20/2023 for a 7 mm obstructing left distal ureteral calculus with sepsis.  CT also showed nonobstructing left renal calculi.  She presents today for definitive stone management.  After reviewing the management options for treatment, the patient elected to proceed with the above surgical procedure(s). We have discussed the potential benefits and risks of the procedure, side effects of the proposed treatment, the likelihood of the patient achieving the goals of the procedure, and any potential problems that might occur during the procedure or recuperation. Informed consent has been obtained.  Description of procedure:  The patient was taken to the operating room and general anesthesia was induced.  The patient was placed in the dorsal lithotomy position, prepped and draped in the usual sterile fashion, and preoperative antibiotics were administered. A preoperative time-out was performed.   A 21 French cystoscope was lubricated and placed per urethra.  Panendoscopy was performed with  findings as described above  The left ureteral stent was grasped with endoscopic forceps and brought out through the urethral meatus.  A 0.038 Sensor wire was then placed through the stent and advanced into the renal pelvis under fluoroscopic guidance.  After stent removal a 4.5 Fr semirigid ureteroscope was then advanced into the ureter next to the guidewire and the calculus was identified.  The stone was then fragmented with a 365 m Moses holmium laser fiber at a setting of 0.3 J/40 hz.   All fragments were then removed from the ureter with a zero tip nitinol basket.  Reinspection of the ureter revealed no remaining visible stones or fragments.   A single channel digital flexible ureteroscope was then placed per urethra and advanced into the left ureter alongside the guidewire.  The remainder of the ureter was normal in appearance.  Once in the renal pelvis contrast was instilled through the ureteroscope.  All calyces were examined under fluoroscopic guidance with findings as described above.  The upper calyceal calculus was dusted with the laser fiber at a setting of 0.3J/80 Hz until no significantly sized fragments were noted.  The lower calyceal calculus was unable to be treated secondary to limitations of scope deflection with the laser fiber through the working channel.  Retrograde pyelogram was performed with findings as described above.  A 6 F/24 cm Contour ureteral stent with tether was placed under fluoroscopic guidance.  The wire was then removed with an adequate stent curl noted in the renal pelvis as well as in the bladder.  The bladder was then emptied and the procedure ended.  The patient appeared to tolerate the procedure well and without complications.  After anesthetic reversal the patient was transported to the PACU in stable condition.   Plan: She will remove her stent on Sunday, 01/01/2024. Postop 1 month follow-up  will be scheduled   Glendia Barba, MD

## 2023-12-29 NOTE — Anesthesia Procedure Notes (Signed)
 Procedure Name: LMA Insertion Date/Time: 12/29/2023 8:29 AM  Performed by: Jackye Spanner, CRNAPre-anesthesia Checklist: Patient identified, Patient being monitored, Timeout performed, Emergency Drugs available and Suction available Patient Re-evaluated:Patient Re-evaluated prior to induction Oxygen Delivery Method: Circle system utilized Preoxygenation: Pre-oxygenation with 100% oxygen Induction Type: IV induction Ventilation: Mask ventilation without difficulty LMA: LMA inserted LMA Size: 4.0 Tube type: Oral Number of attempts: 1 Placement Confirmation: positive ETCO2 and breath sounds checked- equal and bilateral Tube secured with: Tape Dental Injury: Teeth and Oropharynx as per pre-operative assessment  Comments: Smooth atraumatic LMA placement, no complications noted.

## 2023-12-29 NOTE — Discharge Instructions (Addendum)
 DISCHARGE INSTRUCTIONS FOR KIDNEY STONE/URETERAL STENT   MEDICATIONS:  1. Resume all your other meds from home.  2.  AZO (over-the-counter) can help with the burning/stinging when you urinate. 3.  Hydrocodone  is for moderate/severe pain, Rx was sent to your pharmacy if needed. 4.  Rx oxybutynin  was sent to pharmacy if needed for bladder spasm  ACTIVITY:  1. May resume regular activities in 24 hours. 2. No driving while on narcotic pain medications  3. Drink plenty of water  4. Continue to walk at home - you can still get blood clots when you are at home, so keep active, but don't over do it.  5. May return to work/school tomorrow or when you feel ready  6. You have a string coming from your urethra: The stent string is attached to your ureteral stent. Do not pull on this.   SIGNS/SYMPTOMS TO CALL:  Common postoperative symptoms include urinary frequency, urgency, bladder spasm and blood in the urine  Please call us  if you have a fever greater than 101.5, uncontrolled nausea/vomiting, uncontrolled pain, dizziness, unable to urinate, excessively bloody urine, chest pain, shortness of breath, leg swelling, leg pain, or any other concerns or questions.   You can reach us  at [phone].   FOLLOW-UP:  1. You will be contacted for a follow-up appointment 2. You have a string attached to your stent, you may remove it on Sunday, 01/01/2024. To do this, pull the string until the stent is completely removed. You may feel an odd sensation in your back.  If you are uncomfortable removing your stent you can contact the office and a time will be arranged for you to come in Monday to have your stent removed.

## 2023-12-29 NOTE — Interval H&P Note (Signed)
 History and Physical Interval Note:  12/29/2023 8:13 AM  Mary Chen  has presented today for surgery, with the diagnosis of Left Ureteral Stone, Left Nephrolithiasis.  The various methods of treatment have been discussed with the patient and family. After consideration of risks, benefits and other options for treatment, the patient has consented to  Procedure(s): CYSTOSCOPY/URETEROSCOPY/HOLMIUM LASER/STENT PLACEMENT (Left) as a surgical intervention.  The patient's history has been reviewed, patient examined, no change in status, stable for surgery.  I have reviewed the patient's chart and labs.  Questions were answered to the patient's satisfaction.    CV: RRR Lungs: Clear   Carley Glendenning C Christhoper Busbee

## 2023-12-30 ENCOUNTER — Encounter: Payer: Self-pay | Admitting: Urology

## 2024-01-10 ENCOUNTER — Encounter: Admitting: Urology

## 2024-01-30 ENCOUNTER — Ambulatory Visit: Admitting: Urology

## 2024-02-09 ENCOUNTER — Ambulatory Visit: Admitting: Physician Assistant

## 2024-02-09 VITALS — BP 128/85 | HR 89

## 2024-02-09 DIAGNOSIS — N200 Calculus of kidney: Secondary | ICD-10-CM | POA: Diagnosis not present

## 2024-02-09 DIAGNOSIS — N201 Calculus of ureter: Secondary | ICD-10-CM

## 2024-02-09 NOTE — Patient Instructions (Signed)
 Ultrasound scheduling: [phone]

## 2024-02-09 NOTE — Progress Notes (Signed)
 02/09/2024 5:24 PM   Mary Chen 24-Oct-1955 [id number]  CC: Chief Complaint  Patient presents with   Follow-up   Flank Pain   HPI: Mary Chen is a 68 y.o. female with PMH nephrolithiasis s/p staged left ureteroscopy with Dr. Twylla last month for management of an obstructing and infected distal left ureteral stone who presents today for postop follow-up.   Today she reports she removed her stent out postoperatively as instructed and is doing quite well with no acute concerns today.  She wonders about stone prevention instructions.  PMH: Past Medical History:  Diagnosis Date   Complication of anesthesia    a.) PONV   Fibrocystic disease of breast    History of kidney stones    Hypertension    Kidney stone    Ovarian cyst    PONV (postoperative nausea and vomiting)    Postoperative pulmonary embolism (HCC) 03/2008   a.) Factor V Leiden (-), LE ultrasound (-)   Pre-diabetes     Surgical History: Past Surgical History:  Procedure Laterality Date   ABDOMINAL HYSTERECTOMY     BREAST BIOPSY Left    neg   BREAST EXCISIONAL BIOPSY Left    neg   CESAREAN SECTION     x2   CYSTOSCOPY W/ URETERAL STENT PLACEMENT Left 11/20/2023   Procedure: CYSTOSCOPY, WITH RETROGRADE PYELOGRAM AND URETERAL STENT INSERTION;  Surgeon: Twylla Glendia BROCKS, MD;  Location: [facility];  Service: Urology;  Laterality: Left;   CYSTOSCOPY WITH STENT PLACEMENT Left 12/03/2018   Procedure: CYSTOSCOPY WITH STENT PLACEMENT;  Surgeon: Twylla Glendia BROCKS, MD;  Location: [facility];  Service: Urology;  Laterality: Left;   CYSTOSCOPY/URETEROSCOPY/HOLMIUM LASER/STENT PLACEMENT Left 01/23/2019   Procedure: CYSTOSCOPY/URETEROSCOPY//STENT EXCHANGE;  Surgeon: Twylla Glendia BROCKS, MD;  Location: [facility];  Service: Urology;  Laterality: Left;   CYSTOSCOPY/URETEROSCOPY/HOLMIUM LASER/STENT PLACEMENT Left 12/29/2023   Procedure: CYSTOSCOPY/URETEROSCOPY/HOLMIUM LASER/STENT PLACEMENT;  Surgeon: Twylla Glendia BROCKS, MD;   Location: [facility];  Service: Urology;  Laterality: Left;   STONE EXTRACTION WITH BASKET  01/23/2019   Procedure: STONE EXTRACTION WITH BASKET;  Surgeon: Twylla Glendia BROCKS, MD;  Location: [facility];  Service: Urology;;   TOTAL ABDOMINAL HYSTERECTOMY W/ BILATERAL SALPINGOOPHORECTOMY  2009   for bilateral ovarian cysts and fibroid uterus    Home Medications:  Allergies as of 02/09/2024       Reactions   Phenergan  [promethazine ] Nausea Only        Medication List        Accurate as of February 09, 2024  5:24 PM. If you have any questions, ask your nurse or doctor.          cyanocobalamin  1000 MCG tablet Commonly known as: VITAMIN B12 Take 1 tablet (1,000 mcg total) by mouth daily.   HYDROcodone -acetaminophen  5-325 MG tablet Commonly known as: NORCO/VICODIN Take 1 tablet by mouth every 6 (six) hours as needed for moderate pain (pain score 4-6).   naproxen sodium 220 MG tablet Commonly known as: ALEVE Take 220 mg by mouth daily as needed (pain).   oxybutynin  5 MG tablet Commonly known as: DITROPAN  Take 1 tablet (5 mg total) by mouth 3 (three) times daily as needed for frequency,urgency, bladder spasm   rosuvastatin  10 MG tablet Commonly known as: CRESTOR  TAKE 1 TABLET BY MOUTH TWICE A WEEK What changed: See the new instructions.   tamsulosin  0.4 MG Caps capsule Commonly known as: FLOMAX  Take 0.4 mg by mouth daily.   telmisartan  20 MG tablet Commonly known as: MICARDIS   TAKE 1 TABLET BY MOUTH AT BEDTIME   Vitamin D  (Ergocalciferol ) 1.25 MG (50000 UNIT) Caps capsule Commonly known as: DRISDOL  Take 1 capsule (50,000 Units total) by mouth every 7 (seven) days.        Allergies:  Allergies  Allergen Reactions   Phenergan  [Promethazine ] Nausea Only    Family History: Family History  Problem Relation Age of Onset   Cancer Mother        breast   Breast cancer Mother 59   Cancer Father 55       lung Ca mets to liver    Bipolar disorder Brother        self  inficted GSW    Breast cancer Daughter     Social History:   reports that she has never smoked. She has never used smokeless tobacco. She reports that she does not drink alcohol and does not use drugs.  Physical Exam: BP 128/85 (BP Location: Left Arm, Patient Position: Sitting, Cuff Size: Normal)   Pulse 89   SpO2 96%   Constitutional:  Alert and oriented, no acute distress, nontoxic appearing HEENT: [place], AT Cardiovascular: No clubbing, cyanosis, or edema Respiratory: Normal respiratory effort, no increased work of breathing Skin: No rashes, bruises or suspicious lesions Neurologic: Grossly intact, no focal deficits, moving all 4 extremities Psychiatric: Normal mood and affect  Assessment & Plan:   1. Left ureteral calculus (Primary) Recovering well from recent surgery.  Will obtain renal ultrasound to rule out any silent hydronephrosis and contact her with results. - US  RENAL; Future  2. Nephrolithiasis She prefers to follow-up annually with KUB prior, which is reasonable.  No stone analysis information available from her recent procedure, though I will send her general stone prevention information via MyChart.  Return in about 1 year (around 02/08/2025) for Annual stone visit with KUB prior.  Lucie Hones, PA-C  [facility] [place] [address], [address] [place], [place] [address] [phone]

## 2024-02-15 ENCOUNTER — Ambulatory Visit (INDEPENDENT_AMBULATORY_CARE_PROVIDER_SITE_OTHER): Admitting: *Deleted

## 2024-02-15 VITALS — Ht 66.0 in | Wt 210.0 lb

## 2024-02-15 DIAGNOSIS — Z0000 Encounter for general adult medical examination without abnormal findings: Secondary | ICD-10-CM | POA: Diagnosis not present

## 2024-02-15 DIAGNOSIS — Z1211 Encounter for screening for malignant neoplasm of colon: Secondary | ICD-10-CM

## 2024-02-15 NOTE — Progress Notes (Signed)
 Subjective:   Mary Chen is a 68 y.o. who presents for a Medicare Wellness preventive visit.  As a reminder, Annual Wellness Visits don't include a physical exam, and some assessments may be limited, especially if this visit is performed virtually. We may recommend an in-person follow-up visit with your provider if needed.  Visit Complete: Virtual I connected with  Mary Chen on 02/15/24 by a video and audio enabled telemedicine application and verified that I am speaking with the correct person using two identifiers.  Patient Location: Home  Provider Location: Home Office  I discussed the limitations of evaluation and management by telemedicine. The patient expressed understanding and agreed to proceed.  Vital Signs: Because this visit was a virtual/telehealth visit, some criteria may be missing or patient reported. Any vitals not documented were not able to be obtained and vitals that have been documented are patient reported.    Persons Participating in Visit: Patient.  AWV Questionnaire: Yes: Patient Medicare AWV questionnaire was completed by the patient on 02/13/24; I have confirmed that all information answered by patient is correct and no changes since this date.  Cardiac Risk Factors include: advanced age (>41men, >84 women);diabetes mellitus;dyslipidemia;hypertension;obesity (BMI >30kg/m2)     Objective:    Today's Vitals   02/15/24 1042  Weight: 210 lb (95.3 kg)  Height: 5' 6 (1.676 m)   Body mass index is 33.89 kg/m.     02/15/2024   10:52 AM 12/29/2023    7:55 AM 12/27/2023    2:42 PM 11/23/2023   10:54 AM 11/20/2023    7:37 PM 11/20/2023    8:34 AM 01/26/2023   10:40 AM  Advanced Directives  Does Patient Have a Medical Advance Directive? No No No No  No No  Would patient like information on creating a medical advance directive? No - Patient declined No - Patient declined No - Patient declined No - Patient declined No - Patient declined  Yes  (MAU/Ambulatory/Procedural Areas - Information given)    Current Medications (verified) Outpatient Encounter Medications as of 02/15/2024  Medication Sig   cyanocobalamin  1000 MCG tablet Take 1 tablet (1,000 mcg total) by mouth daily.   HYDROcodone -acetaminophen  (NORCO/VICODIN) 5-325 MG tablet Take 1 tablet by mouth every 6 (six) hours as needed for moderate pain (pain score 4-6).   rosuvastatin  (CRESTOR ) 10 MG tablet TAKE 1 TABLET BY MOUTH TWICE A WEEK (Patient taking differently: Take 10 mg by mouth 2 (two) times a week. TAKE 1 TABLET BY MOUTH TWICE A WEEK Sunday and Wednesday QHS)   tamsulosin  (FLOMAX ) 0.4 MG CAPS capsule Take 0.4 mg by mouth daily. (Patient taking differently: Take 0.4 mg by mouth daily.)   telmisartan  (MICARDIS ) 20 MG tablet TAKE 1 TABLET BY MOUTH AT BEDTIME   Vitamin D , Ergocalciferol , (DRISDOL ) 1.25 MG (50000 UNIT) CAPS capsule Take 1 capsule (50,000 Units total) by mouth every 7 (seven) days.   naproxen sodium (ALEVE) 220 MG tablet Take 220 mg by mouth daily as needed (pain). (Patient not taking: Reported on 02/15/2024)   oxybutynin  (DITROPAN ) 5 MG tablet Take 1 tablet (5 mg total) by mouth 3 (three) times daily as needed for frequency,urgency, bladder spasm (Patient not taking: Reported on 02/15/2024)   No facility-administered encounter medications on file as of 02/15/2024.    Allergies (verified) Phenergan  [promethazine ]   History: Past Medical History:  Diagnosis Date   Complication of anesthesia    a.) PONV   Fibrocystic disease of breast    History of kidney  stones    Hypertension    Kidney stone    Ovarian cyst    PONV (postoperative nausea and vomiting)    Postoperative pulmonary embolism (HCC) 03/2008   a.) Factor V Leiden (-), LE ultrasound (-)   Pre-diabetes    Past Surgical History:  Procedure Laterality Date   ABDOMINAL HYSTERECTOMY     BREAST BIOPSY Left    neg   BREAST EXCISIONAL BIOPSY Left    neg   CESAREAN SECTION     x2   CYSTOSCOPY  W/ URETERAL STENT PLACEMENT Left 11/20/2023   Procedure: CYSTOSCOPY, WITH RETROGRADE PYELOGRAM AND URETERAL STENT INSERTION;  Surgeon: Twylla Glendia BROCKS, MD;  Location: [facility];  Service: Urology;  Laterality: Left;   CYSTOSCOPY WITH STENT PLACEMENT Left 12/03/2018   Procedure: CYSTOSCOPY WITH STENT PLACEMENT;  Surgeon: Twylla Glendia BROCKS, MD;  Location: [facility];  Service: Urology;  Laterality: Left;   CYSTOSCOPY/URETEROSCOPY/HOLMIUM LASER/STENT PLACEMENT Left 01/23/2019   Procedure: CYSTOSCOPY/URETEROSCOPY//STENT EXCHANGE;  Surgeon: Twylla Glendia BROCKS, MD;  Location: [facility];  Service: Urology;  Laterality: Left;   CYSTOSCOPY/URETEROSCOPY/HOLMIUM LASER/STENT PLACEMENT Left 12/29/2023   Procedure: CYSTOSCOPY/URETEROSCOPY/HOLMIUM LASER/STENT PLACEMENT;  Surgeon: Twylla Glendia BROCKS, MD;  Location: [facility];  Service: Urology;  Laterality: Left;   STONE EXTRACTION WITH BASKET  01/23/2019   Procedure: STONE EXTRACTION WITH BASKET;  Surgeon: Twylla Glendia BROCKS, MD;  Location: [facility];  Service: Urology;;   TOTAL ABDOMINAL HYSTERECTOMY W/ BILATERAL SALPINGOOPHORECTOMY  2009   for bilateral ovarian cysts and fibroid uterus   Family History  Problem Relation Age of Onset   Cancer Mother        breast   Breast cancer Mother 19   Cancer Father 41       lung Ca mets to liver    Bipolar disorder Brother        self inficted GSW    Breast cancer Daughter    Social History   Socioeconomic History   Marital status: Divorced    Spouse name: Not on file   Number of children: 2   Years of education: Not on file   Highest education level: Associate degree: academic program  Occupational History    Employer: [employer]    CommentDesigner, industrial/product at [employer]   Occupation: retired  Tobacco Use   Smoking status: Never   Smokeless tobacco: Never  Vaping Use   Vaping status: Never Used  Substance and Sexual Activity   Alcohol use: No   Drug use: No   Sexual activity: Yes    Birth control/protection: Post-menopausal  Other  Topics Concern   Not on file  Social History Narrative   Divorced, 2 daughters living in [place]   Social Drivers of Health   Financial Resource Strain: Low Risk  (02/13/2024)   Overall Financial Resource Strain (CARDIA)    Difficulty of Paying Living Expenses: Not very hard  Food Insecurity: No Food Insecurity (02/13/2024)   Hunger Vital Sign    Worried About Running Out of Food in the Last Year: Never true    Ran Out of Food in the Last Year: Never true  Transportation Needs: No Transportation Needs (02/13/2024)   PRAPARE - Administrator, Civil Service (Medical): No    Lack of Transportation (Non-Medical): No  Physical Activity: Sufficiently Active (02/13/2024)   Exercise Vital Sign    Days of Exercise per Week: 3 days    Minutes of Exercise per Session: 50 min  Stress: No Stress Concern Present (02/13/2024)  [employer] of Occupational Health - Occupational Stress Questionnaire    Feeling of Stress: Not at all  Social Connections: Moderately Integrated (02/13/2024)   Social Connection and Isolation Panel    Frequency of Communication with Friends and Family: More than three times a week    Frequency of Social Gatherings with Friends and Family: More than three times a week    Attends Religious Services: More than 4 times per year    Active Member of [employer] or Organizations: Yes    Attends Engineer, structural: More than 4 times per year    Marital Status: Divorced    Tobacco Counseling Counseling given: Not Answered    Clinical Intake:  Pre-visit preparation completed: Yes  Pain : No/denies pain     BMI - recorded: 33.89 Nutritional Status: BMI > 30  Obese Nutritional Risks: None Diabetes: Yes CBG done?: No  Lab Results  Component Value Date   HGBA1C 6.1 (H) 11/21/2023   HGBA1C 6.3 03/03/2023   HGBA1C 6.2 07/21/2022     How often do you need to have someone help you when you read instructions, pamphlets, or other written  materials from your doctor or pharmacy?: 1 - Never  Interpreter Needed?: No  Information entered by :: R. Ciella Obi LPN   Activities of Daily Living     02/15/2024   10:43 AM 12/27/2023    2:43 PM  In your present state of health, do you have any difficulty performing the following activities:  Hearing? 0   Vision? 0   Difficulty concentrating or making decisions? 0   Walking or climbing stairs? 0   Dressing or bathing? 0   Doing errands, shopping? 0 0  Preparing Food and eating ? N   Using the Toilet? N   In the past six months, have you accidently leaked urine? N   Do you have problems with loss of bowel control? N   Managing your Medications? N   Managing your Finances? N   Housekeeping or managing your Housekeeping? N     Patient Care Team: Marylynn Verneita CROME, MD as PCP - General (Internal Medicine) Broadus Bare, OD (Optometry) Twylla, Glendia BROCKS, MD (Urology) [facility], [facility] (Optometry)  I have updated your Care Teams any recent Medical Services you may have received from other providers in the past year.     Assessment:   This is a routine wellness examination for Mary Chen.  Hearing/Vision screen Hearing Screening - Comments:: No issues Vision Screening - Comments:: glasses   Goals Addressed             This Visit's Progress    Patient Stated       Continue to exercise       Depression Screen     02/15/2024   10:49 AM 12/05/2023    9:05 AM 11/24/2023   10:42 AM 03/11/2023   12:56 PM 01/26/2023   10:38 AM 07/21/2022    8:38 AM 01/20/2022   10:11 AM  PHQ 2/9 Scores  PHQ - 2 Score 0 0 0 0 0 0 0  PHQ- 9 Score 0 0  0 0      Fall Risk     02/15/2024   10:45 AM 12/05/2023    9:05 AM 03/11/2023   12:57 PM 03/11/2023   12:56 PM 01/25/2023   10:10 PM  Fall Risk   Falls in the past year? 0 0 0 0 0  Number falls in past yr: 0 0 0  0 0  Injury with Fall? 0 0 0 0 0  Risk for fall due to : No Fall Risks No Fall Risks No Fall Risks No Fall Risks No Fall  Risks  Follow up Falls evaluation completed;Falls prevention discussed Falls evaluation completed Falls evaluation completed Falls evaluation completed Falls prevention discussed    MEDICARE RISK AT HOME:  Medicare Risk at Home Any stairs in or around the home?: Yes If so, are there any without handrails?: No Home free of loose throw rugs in walkways, pet beds, electrical cords, etc?: Yes Adequate lighting in your home to reduce risk of falls?: Yes Life alert?: No Use of a cane, walker or w/c?: No Grab bars in the bathroom?: Yes Shower chair or bench in shower?: No Elevated toilet seat or a handicapped toilet?: Yes  TIMED UP AND GO:  Was the test performed?  No  Cognitive Function: 6CIT completed        02/15/2024   10:52 AM 01/26/2023   10:41 AM 07/22/2021    1:31 PM  6CIT Screen  What Year? 0 points 0 points 0 points  What month? 0 points 0 points 0 points  What time? 0 points 0 points 0 points  Count back from 20 0 points 0 points 0 points  Months in reverse 0 points 0 points 0 points  Repeat phrase 0 points 0 points 0 points  Total Score 0 points 0 points 0 points    Immunizations Immunization History  Administered Date(s) Administered   Fluad Quad(high Dose 65+) 01/15/2021, 02/16/2022   Fluad Trivalent(High Dose 65+) 03/03/2023   Influenza Split 01/23/2013   Influenza-Unspecified 02/15/2012, 02/12/2015, 02/20/2016, 02/19/2018, 02/20/2019   PFIZER(Purple Top)SARS-COV-2 Vaccination 07/06/2019, 07/27/2019, 03/04/2020   PNEUMOCOCCAL CONJUGATE-20 01/15/2021   Pfizer Covid-19 Vaccine Bivalent Booster 21yrs & up 07/02/2021   Respiratory Syncytial Virus Vaccine,Recomb Aduvanted(Arexvy) 06/02/2022   Tdap 04/21/2015   Zoster Recombinant(Shingrix ) 05/21/2021, 01/26/2022    Screening Tests Health Maintenance  Topic Date Due   Diabetic kidney evaluation - Urine ACR  Never done   FOOT EXAM  01/21/2023   Influenza Vaccine  12/16/2023   COVID-19 Vaccine (5 - 2025-26  season) 01/16/2024   Medicare Annual Wellness (AWV)  01/26/2024   Fecal DNA (Cologuard)  02/11/2024   HEMOGLOBIN A1C  05/23/2024   OPHTHALMOLOGY EXAM  06/12/2024   Mammogram  09/05/2024   Diabetic kidney evaluation - eGFR measurement  12/04/2024   DTaP/Tdap/Td (2 - Td or Tdap) 04/20/2025   DEXA SCAN  09/05/2025   Pneumococcal Vaccine: 50+ Years  Completed   Hepatitis C Screening  Completed   Zoster Vaccines- Shingrix   Completed   HPV VACCINES  Aged Out   Meningococcal B Vaccine  Aged Out   Colonoscopy  Discontinued    Health Maintenance Items Addressed: Cologuard Ordered Discussed the need to update flu and covid vaccines.  Patient needs a foot exam at next office and documented.  Additional Screening:  Vision Screening: Recommended annual ophthalmology exams for early detection of glaucoma and other disorders of the eye. Is the patient up to date with their annual eye exam?  Yes  Who is the provider or what is the name of the office in which the patient attends annual eye exams? [facility]  Dental Screening: Recommended annual dental exams for proper oral hygiene  Community Resource Referral / Chronic Care Management: CRR required this visit?  No   CCM required this visit?  No   Plan:    I have personally reviewed  and noted the following in the patient's chart:   Medical and social history Use of alcohol, tobacco or illicit drugs  Current medications and supplements including opioid prescriptions. Patient is currently taking opioid prescriptions. Information provided to patient regarding non-opioid alternatives. Patient advised to discuss non-opioid treatment plan with their provider. Functional ability and status Nutritional status Physical activity Advanced directives List of other physicians Hospitalizations, surgeries, and ER visits in previous 12 months Vitals Screenings to include cognitive, depression, and falls Referrals and appointments  In addition, I  have reviewed and discussed with patient certain preventive protocols, quality metrics, and best practice recommendations. A written personalized care plan for preventive services as well as general preventive health recommendations were provided to patient.   Angeline Fredericks, LPN   [id number]/12/[id number]   After Visit Summary: (MyChart) Due to this being a telephonic visit, the after visit summary with patients personalized plan was offered to patient via MyChart   Notes: Nothing significant to report at this time. Needs a diabetic foot exam completed and documented at next office visit

## 2024-02-15 NOTE — Patient Instructions (Signed)
 [name],  Thank you for taking the time for your Medicare Wellness Visit. I appreciate your continued commitment to your health goals. Please review the care plan we discussed, and feel free to reach out if I can assist you further.  Medicare recommends these wellness visits once per year to help you and your care team stay ahead of potential health issues. These visits are designed to focus on prevention, allowing your provider to concentrate on managing your acute and chronic conditions during your regular appointments.  Please note that Annual Wellness Visits do not include a physical exam. Some assessments may be limited, especially if the visit was conducted virtually. If needed, we may recommend a separate in-person follow-up with your provider.  Ongoing Care Seeing your primary care provider every 3 to 6 months helps us  monitor your health and provide consistent, personalized care.  A cologuard test has been ordered for you.  Remember to update your flu and covid vaccines.  Referrals If a referral was made during today's visit and you haven't received any updates within two weeks, please contact the referred provider directly to check on the status.  Recommended Screenings:  Health Maintenance  Topic Date Due   Yearly kidney health urinalysis for diabetes  Never done   Complete foot exam   01/21/2023   Flu Shot  12/16/2023   COVID-19 Vaccine (5 - 2025-26 season) 01/16/2024   Cologuard (Stool DNA test)  02/11/2024   Hemoglobin A1C  05/23/2024   Eye exam for diabetics  06/12/2024   Breast Cancer Screening  09/05/2024   Yearly kidney function blood test for diabetes  12/04/2024   Medicare Annual Wellness Visit  02/14/2025   DTaP/Tdap/Td vaccine (2 - Td or Tdap) 04/20/2025   DEXA scan (bone density measurement)  09/05/2025   Pneumococcal Vaccine for age over 78  Completed   Hepatitis C Screening  Completed   Zoster (Shingles) Vaccine  Completed   HPV Vaccine  Aged Out    Meningitis B Vaccine  Aged Out   Colon Cancer Screening  Discontinued       02/15/2024   10:52 AM  Advanced Directives  Does Patient Have a Medical Advance Directive? No  Would patient like information on creating a medical advance directive? No - Patient declined   Advance Care Planning is important because it: Ensures you receive medical care that aligns with your values, goals, and preferences. Provides guidance to your family and loved ones, reducing the emotional burden of decision-making during critical moments.  Vision: Annual vision screenings are recommended for early detection of glaucoma, cataracts, and diabetic retinopathy. These exams can also reveal signs of chronic conditions such as diabetes and high blood pressure.  Dental: Annual dental screenings help detect early signs of oral cancer, gum disease, and other conditions linked to overall health, including heart disease and diabetes.  Please see the attached documents for additional preventive care recommendations.  Managing Pain Without Opioids Opioids are strong medicines used to treat moderate to severe pain. For some people, especially those who have long-term (chronic) pain, opioids may not be the best choice for pain management due to: Side effects like nausea, constipation, and sleepiness. The risk of addiction (opioid use disorder). The longer you take opioids, the greater your risk of addiction. Pain that lasts for more than 3 months is called chronic pain. Managing chronic pain usually requires more than one approach and is often provided by a team of health care providers working together (multidisciplinary approach). Pain  management may be done at a pain management center or pain clinic. How to manage pain without the use of opioids Use non-opioid medicines Non-opioid medicines for pain may include: Over-the-counter or prescription non-steroidal anti-inflammatory drugs (NSAIDs). These may be the first medicines  used for pain. They work well for muscle and bone pain, and they reduce swelling. Acetaminophen . This over-the-counter medicine may work well for milder pain but not swelling. Antidepressants. These may be used to treat chronic pain. A certain type of antidepressant (tricyclics) is often used. These medicines are given in lower doses for pain than when used for depression. Anticonvulsants. These are usually used to treat seizures but may also reduce nerve (neuropathic) pain. Muscle relaxants. These relieve pain caused by sudden muscle tightening (spasms). You may also use a pain medicine that is applied to the skin as a patch, cream, or gel (topical analgesic), such as a numbing medicine. These may cause fewer side effects than medicines taken by mouth. Do certain therapies as directed Some therapies can help with pain management. They include: Physical therapy. You will do exercises to gain strength and flexibility. A physical therapist may teach you exercises to move and stretch parts of your body that are weak, stiff, or painful. You can learn these exercises at physical therapy visits and practice them at home. Physical therapy may also involve: Massage. Heat wraps or applying heat or cold to affected areas. Electrical signals that interrupt pain signals (transcutaneous electrical nerve stimulation, TENS). Weak lasers that reduce pain and swelling (low-level laser therapy). Signals from your body that help you learn to regulate pain (biofeedback). Occupational therapy. This helps you to learn ways to function at home and work with less pain. Recreational therapy. This involves trying new activities or hobbies, such as a physical activity or drawing. Mental health therapy, including: Cognitive behavioral therapy (CBT). This helps you learn coping skills for dealing with pain. Acceptance and commitment therapy (ACT) to change the way you think and react to pain. Relaxation therapies, including  muscle relaxation exercises and mindfulness-based stress reduction. Pain management counseling. This may be individual, family, or group counseling.  Receive medical treatments Medical treatments for pain management include: Nerve block injections. These may include a pain blocker and anti-inflammatory medicines. You may have injections: Near the spine to relieve chronic back or neck pain. Into joints to relieve back or joint pain. Into nerve areas that supply a painful area to relieve body pain. Into muscles (trigger point injections) to relieve some painful muscle conditions. A medical device placed near your spine to help block pain signals and relieve nerve pain or chronic back pain (spinal cord stimulation device). Acupuncture. Follow these instructions at home Medicines Take over-the-counter and prescription medicines only as told by your health care provider. If you are taking pain medicine, ask your health care providers about possible side effects to watch out for. Do not drive or use heavy machinery while taking prescription opioid pain medicine. Lifestyle  Do not use drugs or alcohol to reduce pain. If you drink alcohol, limit how much you have to: 0-1 drink a day for women who are not pregnant. 0-2 drinks a day for men. Know how much alcohol is in a drink. In the [place] one drink equals one 12 oz bottle of beer (355 mL), one 5 oz glass of wine (148 mL), or one 1 oz glass of hard liquor (44 mL). Do not use any products that contain nicotine or tobacco. These products include cigarettes, chewing tobacco,  and vaping devices, such as e-cigarettes. If you need help quitting, ask your health care provider. Eat a healthy diet and maintain a healthy weight. Poor diet and excess weight may make pain worse. Eat foods that are high in fiber. These include fresh fruits and vegetables, whole grains, and beans. Limit foods that are high in fat and processed sugars, such as fried and sweet  foods. Exercise regularly. Exercise lowers stress and may help relieve pain. Ask your health care provider what activities and exercises are safe for you. If your health care provider approves, join an exercise class that combines movement and stress reduction. Examples include yoga and tai chi. Get enough sleep. Lack of sleep may make pain worse. Lower stress as much as possible. Practice stress reduction techniques as told by your therapist. General instructions Work with all your pain management providers to find the treatments that work best for you. You are an important member of your pain management team. There are many things you can do to reduce pain on your own. Consider joining an online or in-person support group for people who have chronic pain. Keep all follow-up visits. This is important. Where to find more information You can find more information about managing pain without opioids from: American Academy of Pain Medicine: painmed.org Institute for Chronic Pain: instituteforchronicpain.org American Chronic Pain Association: theacpa.org Contact a health care provider if: You have side effects from pain medicine. Your pain gets worse or does not get better with treatments or home therapy. You are struggling with anxiety or depression. Summary Many types of pain can be managed without opioids. Chronic pain may respond better to pain management without opioids. Pain is best managed when you and a team of health care providers work together. Pain management without opioids may include non-opioid medicines, medical treatments, physical therapy, mental health therapy, and lifestyle changes. Tell your health care providers if your pain gets worse or is not being managed well enough. This information is not intended to replace advice given to you by your health care provider. Make sure you discuss any questions you have with your health care provider. Document Revised: 08/13/2020  Document Reviewed: 08/13/2020 Elsevier Patient Education  2024 [employer].

## 2024-02-16 ENCOUNTER — Ambulatory Visit
Admission: RE | Admit: 2024-02-16 | Discharge: 2024-02-16 | Disposition: A | Discharge status: Home / Self Care | Source: Ambulatory Visit | Attending: Physician Assistant | Admitting: Physician Assistant

## 2024-02-16 ENCOUNTER — Other Ambulatory Visit: Payer: Self-pay

## 2024-02-16 DIAGNOSIS — N1330 Unspecified hydronephrosis: Secondary | ICD-10-CM | POA: Diagnosis not present

## 2024-02-16 DIAGNOSIS — N201 Calculus of ureter: Secondary | ICD-10-CM | POA: Diagnosis not present

## 2024-02-16 MED ORDER — FLUZONE HIGH-DOSE 0.5 ML IM SUSY
0.5000 mL | PREFILLED_SYRINGE | Freq: Once | INTRAMUSCULAR | 0 refills | Status: AC
  Filled 2024-02-16: qty 0.5, 1d supply, fill #0

## 2024-02-16 MED ORDER — COMIRNATY 30 MCG/0.3ML IM SUSY
0.3000 mL | PREFILLED_SYRINGE | Freq: Once | INTRAMUSCULAR | 0 refills | Status: AC
  Filled 2024-02-16: qty 0.3, 1d supply, fill #0

## 2024-02-23 ENCOUNTER — Other Ambulatory Visit: Payer: Self-pay | Admitting: Internal Medicine

## 2024-02-24 ENCOUNTER — Ambulatory Visit: Payer: Self-pay | Admitting: Physician Assistant

## 2024-02-25 ENCOUNTER — Other Ambulatory Visit: Payer: Self-pay | Admitting: Internal Medicine

## 2024-03-01 DIAGNOSIS — Z1211 Encounter for screening for malignant neoplasm of colon: Secondary | ICD-10-CM | POA: Diagnosis not present

## 2024-03-08 ENCOUNTER — Other Ambulatory Visit: Payer: Self-pay

## 2024-03-08 DIAGNOSIS — N200 Calculus of kidney: Secondary | ICD-10-CM

## 2024-03-08 LAB — COLOGUARD: COLOGUARD: NEGATIVE

## 2024-03-11 ENCOUNTER — Ambulatory Visit: Payer: Self-pay | Admitting: Internal Medicine

## 2024-03-21 ENCOUNTER — Ambulatory Visit: Admitting: Internal Medicine

## 2024-03-21 ENCOUNTER — Encounter: Payer: Self-pay | Admitting: Internal Medicine

## 2024-03-21 VITALS — BP 124/72 | HR 76 | Ht 66.0 in | Wt 219.2 lb

## 2024-03-21 DIAGNOSIS — E119 Type 2 diabetes mellitus without complications: Secondary | ICD-10-CM

## 2024-03-21 DIAGNOSIS — N200 Calculus of kidney: Secondary | ICD-10-CM

## 2024-03-21 DIAGNOSIS — Z6831 Body mass index (BMI) 31.0-31.9, adult: Secondary | ICD-10-CM

## 2024-03-21 DIAGNOSIS — K760 Fatty (change of) liver, not elsewhere classified: Secondary | ICD-10-CM | POA: Diagnosis not present

## 2024-03-21 DIAGNOSIS — I10 Essential (primary) hypertension: Secondary | ICD-10-CM

## 2024-03-21 DIAGNOSIS — E785 Hyperlipidemia, unspecified: Secondary | ICD-10-CM

## 2024-03-21 DIAGNOSIS — E66811 Obesity, class 1: Secondary | ICD-10-CM | POA: Diagnosis not present

## 2024-03-21 DIAGNOSIS — Z0000 Encounter for general adult medical examination without abnormal findings: Secondary | ICD-10-CM | POA: Diagnosis not present

## 2024-03-21 DIAGNOSIS — E6609 Other obesity due to excess calories: Secondary | ICD-10-CM

## 2024-03-21 DIAGNOSIS — E669 Obesity, unspecified: Secondary | ICD-10-CM

## 2024-03-21 DIAGNOSIS — E1169 Type 2 diabetes mellitus with other specified complication: Secondary | ICD-10-CM

## 2024-03-21 LAB — COMPREHENSIVE METABOLIC PANEL WITH GFR
ALT: 12 U/L (ref 0–35)
AST: 14 U/L (ref 0–37)
Albumin: 4.1 g/dL (ref 3.5–5.2)
Alkaline Phosphatase: 66 U/L (ref 39–117)
BUN: 17 mg/dL (ref 6–23)
CO2: 29 meq/L (ref 19–32)
Calcium: 9.2 mg/dL (ref 8.4–10.5)
Chloride: 103 meq/L (ref 96–112)
Creatinine, Ser: 0.84 mg/dL (ref 0.40–1.20)
GFR: 71.11 mL/min (ref >60.00)
Glucose, Bld: 86 mg/dL (ref 70–99)
Potassium: 4.1 meq/L (ref 3.5–5.1)
Sodium: 140 meq/L (ref 135–145)
Total Bilirubin: 0.9 mg/dL (ref 0.2–1.2)
Total Protein: 7.2 g/dL (ref 6.0–8.3)

## 2024-03-21 LAB — LIPID PANEL
Cholesterol: 195 mg/dL (ref 0–200)
HDL: 65.1 mg/dL (ref >39.00)
LDL Cholesterol: 108 mg/dL — ABNORMAL HIGH (ref 0–99)
NonHDL: 129.74
Total CHOL/HDL Ratio: 3
Triglycerides: 108 mg/dL (ref 0.0–149.0)
VLDL: 21.6 mg/dL (ref 0.0–40.0)

## 2024-03-21 LAB — CBC WITH DIFFERENTIAL/PLATELET
Basophils Absolute: 0 K/uL (ref 0.0–0.1)
Basophils Relative: 0.4 % (ref 0.0–3.0)
Eosinophils Absolute: 0 K/uL (ref 0.0–0.7)
Eosinophils Relative: 0.8 % (ref 0.0–5.0)
HCT: 41 % (ref 36.0–46.0)
Hemoglobin: 12.9 g/dL (ref 12.0–15.0)
Lymphocytes Relative: 41.1 % (ref 12.0–46.0)
Lymphs Abs: 1.9 K/uL (ref 0.7–4.0)
MCHC: 31.4 g/dL (ref 30.0–36.0)
MCV: 71.2 fl — ABNORMAL LOW (ref 78.0–100.0)
Monocytes Absolute: 0.5 K/uL (ref 0.1–1.0)
Monocytes Relative: 11.5 % (ref 3.0–12.0)
Neutro Abs: 2.2 K/uL (ref 1.4–7.7)
Neutrophils Relative %: 46.2 % (ref 43.0–77.0)
Platelets: 230 K/uL (ref 150.0–400.0)
RBC: 5.76 Mil/uL — ABNORMAL HIGH (ref 3.87–5.11)
RDW: 17.5 % — ABNORMAL HIGH (ref 11.5–15.5)
WBC: 4.7 K/uL (ref 4.0–10.5)

## 2024-03-21 LAB — MICROALBUMIN / CREATININE URINE RATIO
Creatinine,U: 141.9 mg/dL
Microalb Creat Ratio: 10.6 mg/g (ref 0.0–30.0)
Microalb, Ur: 1.5 mg/dL (ref 0.0–1.9)

## 2024-03-21 LAB — HEMOGLOBIN A1C: Hgb A1c MFr Bld: 6.1 % (ref 4.6–6.5)

## 2024-03-21 LAB — LDL CHOLESTEROL, DIRECT: Direct LDL: 120 mg/dL

## 2024-03-21 NOTE — Assessment & Plan Note (Signed)
 Not at goal on rosuvastatin  10 mg twic weekly , whic his the maximal tolerated dose.  Lab Results  Component Value Date   CHOL 195 03/21/2024   HDL 65.10 03/21/2024   LDLCALC 108 (H) 03/21/2024   LDLDIRECT 120.0 03/21/2024   TRIG 108.0 03/21/2024   CHOLHDL 3 03/21/2024

## 2024-03-21 NOTE — Patient Instructions (Signed)
 Good to see you!  Continue your current medications.   Keep working on your weight.. Consider substituting one meal daily with a Healthy Choice entree:  the  low carb power bowls or simply Steamer  entrees to shave off calories without adding a lot of carbs.

## 2024-03-21 NOTE — Progress Notes (Unsigned)
 Patient ID: Mary Chen, female    DOB: 1955/07/20  Age: 68 y.o. MRN: [id number]  The patient is here for annual preventive examination and management of other chronic and acute problems.   The risk factors are reflected in the social history.   The roster of all physicians providing medical care to patient - is listed in the Snapshot section of the chart.   Activities of daily living:  The patient is 100% independent in all ADLs: dressing, toileting, feeding as well as independent mobility   Home safety : The patient has smoke detectors in the home. They wear seatbelts.  There are no unsecured firearms at home. There is no violence in the home.    There is no risks for hepatitis, STDs or HIV. There is no   history of blood transfusion. They have no travel history to infectious disease endemic areas of the world.   The patient has seen their dentist in the last six month. They have seen their eye doctor in the last year. The patinet  denies slight hearing difficulty with regard to whispered voices and some television programs.  They have deferred audiologic testing in the last year.  They do not  have excessive sun exposure. Discussed the need for sun protection: hats, long sleeves and use of sunscreen if there is significant sun exposure.    Diet: the importance of a healthy diet is discussed. They do have a healthy diet.   The benefits of regular aerobic exercise were discussed. The patient  exercises  3 to 5 days per week  for  60 minutes.    Depression screen: there are no signs or vegative symptoms of depression- irritability, change in appetite, anhedonia, sadness/tearfullness.   The following portions of the patient's history were reviewed and updated as appropriate: allergies, current medications, past family history, past medical history,  past surgical history, past social history  and problem list.   Visual acuity was not assessed per patient preference since the patient has  regular follow up with an  ophthalmologist. Hearing and body mass index were assessed and reviewed.    During the course of the visit the patient was educated and counseled about appropriate screening and preventive services including : fall prevention , diabetes screening, nutrition counseling, colorectal cancer screening, and recommended immunizations.    Chief Complaint:  Obesity diabetes and hypertension:  exercising 3-4 times  per week at the gym .  Couldn't continue Mounjaro  due to cost.    Last eye exam Jan/Feb.    Sleeping well 5-7 hours per night. Occasional naps  after workout days.   Eating more oatmeal,  has several BMs daily ,  all formed .  Lots of broccholi  green vegetables    Review of Symptoms  Patient denies headache, fevers, malaise, unintentional weight loss, skin rash, eye pain, sinus congestion and sinus pain, sore throat, dysphagia,  hemoptysis , cough, dyspnea, wheezing, chest pain, palpitations, orthopnea, edema, abdominal pain, nausea, melena, diarrhea, constipation, flank pain, dysuria, hematuria, urinary  Frequency, nocturia, numbness, tingling, seizures,  Focal weakness, Loss of consciousness,  Tremor, insomnia, depression, anxiety, and suicidal ideation.    Physical Exam:  BP 124/72   Pulse 76   Ht 5' 6 (1.676 m)   Wt 219 lb 3.2 oz (99.4 kg)   SpO2 96%   BMI 35.38 kg/m    Physical Exam Vitals reviewed.  Constitutional:      General: She is not in acute distress.  Appearance: Normal appearance. She is well-developed and normal weight. She is not ill-appearing, toxic-appearing or diaphoretic.  HENT:     Head: Normocephalic.     Right Ear: Tympanic membrane, ear canal and external ear normal. There is no impacted cerumen.     Left Ear: Tympanic membrane, ear canal and external ear normal. There is no impacted cerumen.     Nose: Nose normal.     Mouth/Throat:     Mouth: Mucous membranes are moist.     Pharynx: Oropharynx is clear.  Eyes:      General: No scleral icterus.       Right eye: No discharge.        Left eye: No discharge.     Conjunctiva/sclera: Conjunctivae normal.     Pupils: Pupils are equal, round, and reactive to light.  Neck:     Thyroid : No thyromegaly.     Vascular: No carotid bruit or JVD.  Cardiovascular:     Rate and Rhythm: Normal rate and regular rhythm.     Heart sounds: Normal heart sounds.  Pulmonary:     Effort: Pulmonary effort is normal. No respiratory distress.     Breath sounds: Normal breath sounds.  Chest:  Breasts:    Breasts are symmetrical.     Right: Normal. No swelling, inverted nipple, mass, nipple discharge, skin change or tenderness.     Left: Normal. No swelling, inverted nipple, mass, nipple discharge, skin change or tenderness.  Abdominal:     General: Bowel sounds are normal.     Palpations: Abdomen is soft. There is no mass.     Tenderness: There is no abdominal tenderness. There is no guarding or rebound.  Musculoskeletal:        General: Normal range of motion.     Cervical back: Normal range of motion and neck supple.  Lymphadenopathy:     Cervical: No cervical adenopathy.     Upper Body:     Right upper body: No supraclavicular, axillary or pectoral adenopathy.     Left upper body: No supraclavicular, axillary or pectoral adenopathy.  Skin:    General: Skin is warm and dry.  Neurological:     General: No focal deficit present.     Mental Status: She is alert and oriented to person, place, and time. Mental status is at baseline.  Psychiatric:        Mood and Affect: Mood normal.        Behavior: Behavior normal.        Thought Content: Thought content normal.        Judgment: Judgment normal.     Assessment and Plan: Obesity, diabetes, and hypertension syndrome (HCC) Assessment & Plan: Her diabetes is controlled with diet,  but she has had a weight gain of 9 lbs noted  over the past year despite good effort..  Foot exam normal , today and eye exam up to date.   BP is at goal on telmisartan  2 0 mg and statin  dose was increased last year,  goal LDL is now 70.  She demonstrate  tolerance to mounjaro  with samples but the medication was  too cost prohibitive (Tier 3,  same for ozempic) to continue   Lab Results  Component Value Date   LABMICR See below: 12/20/2023   LABMICR See below: 12/04/2020   Lab Results  Component Value Date   HGBA1C 6.1 (H) 11/21/2023      Lab Results  Component Value Date  HGBA1C 6.1 (H) 11/21/2023      Orders: -     Comprehensive metabolic panel with GFR -     Hemoglobin A1c -     Microalbumin / creatinine urine ratio  Dyslipidemia associated with type 2 diabetes mellitus (HCC) Assessment & Plan: Not at goal on rosuvastatin  10 mg twic weekly , whic his the maximal tolerated dose.  Lab Results  Component Value Date   CHOL 195 03/21/2024   HDL 65.10 03/21/2024   LDLCALC 108 (H) 03/21/2024   LDLDIRECT 120.0 03/21/2024   TRIG 108.0 03/21/2024   CHOLHDL 3 03/21/2024     Orders: -     Lipid panel -     LDL cholesterol, direct -     CBC with Differential/Platelet -     TSH  Encounter for preventative adult health care examination  Bilateral nephrolithiasis Assessment & Plan: She had an episode this summer  of left side nephrolithiasis and ureteral calculi requiring lithotripsy and stening by  [facility]. .  Has been asymptomatic since then.  Encourage increased water intake    Hepatic steatosis Assessment & Plan: LFTS are normal;  will recommend GLP 1 agonist given comorbid conditions of obesity and type 2 DM   Lab Results  Component Value Date   ALT 12 03/21/2024   AST 14 03/21/2024   ALKPHOS 66 03/21/2024   BILITOT 0.9 03/21/2024      Class 1 obesity due to excess calories with serious comorbidity and body mass index (BMI) of 31.0 to 31.9 in adult Assessment & Plan: I have congratulated her in the lifestyle changes she has made and encouraged Continued weight loss with goal of 10% of body  weight over the next 6 months using a low glycemic index diet and regular exercise a minimum of 5 days per week.  Advised to consider use of GLP 1 agonist    Encounter for preventive health examination Assessment & Plan: age appropriate education and counseling updated, referrals for preventative services and immunizations addressed, dietary and smoking counseling addressed, most recent labs reviewed.  I have personally reviewed and have noted:   1) the patient's medical and social history 2) The pt's use of alcohol, tobacco, and illicit drugs 3) The patient's current medications and supplements 4) Functional ability including ADL's, fall risk, home safety risk, hearing and visual impairment 5) Diet and physical activities 6) Evidence for depression or mood disorder 7) The patient's height, weight, and BMI have been recorded in the chart    I have made referrals, and provided counseling and education based on review of the above      No follow-ups on file.  Verneita LITTIE Kettering, MD

## 2024-03-21 NOTE — Assessment & Plan Note (Addendum)
 Her diabetes is controlled with diet,  but she has had a weight gain of 9 lbs noted  over the past year despite good effort..  Foot exam normal , today and eye exam up to date.  BP is at goal on telmisartan  2 0 mg and statin  dose was increased last year,  goal LDL is now 70.  She demonstrate  tolerance to mounjaro  with samples but the medication was  too cost prohibitive (Tier 3,  same for ozempic) to continue   Lab Results  Component Value Date   LABMICR See below: 12/20/2023   LABMICR See below: 12/04/2020   Lab Results  Component Value Date   HGBA1C 6.1 (H) 11/21/2023      Lab Results  Component Value Date   HGBA1C 6.1 (H) 11/21/2023

## 2024-03-21 NOTE — Assessment & Plan Note (Addendum)
 She had an episode this summer  of left side nephrolithiasis and ureteral calculi requiring lithotripsy and stening by  [facility]. .  Has been asymptomatic since then.  Encourage increased water intake

## 2024-03-22 LAB — TSH: TSH: 1.3 u[IU]/mL (ref 0.35–5.50)

## 2024-03-22 NOTE — Assessment & Plan Note (Signed)
 LFTS are normal;  will recommend GLP 1 agonist given comorbid conditions of obesity and type 2 DM   Lab Results  Component Value Date   ALT 12 03/21/2024   AST 14 03/21/2024   ALKPHOS 66 03/21/2024   BILITOT 0.9 03/21/2024

## 2024-03-22 NOTE — Assessment & Plan Note (Signed)

## 2024-03-22 NOTE — Assessment & Plan Note (Signed)
 I have congratulated her in the lifestyle changes she has made and encouraged Continued weight loss with goal of 10% of body weight over the next 6 months using a low glycemic index diet and regular exercise a minimum of 5 days per week.  Advised to consider use of GLP 1 agonist

## 2024-03-23 ENCOUNTER — Ambulatory Visit: Payer: Self-pay | Admitting: Internal Medicine

## 2024-05-25 ENCOUNTER — Ambulatory Visit
Admission: RE | Admit: 2024-05-25 | Discharge: 2024-05-25 | Disposition: A | Source: Ambulatory Visit | Attending: Physician Assistant

## 2024-05-25 DIAGNOSIS — N200 Calculus of kidney: Secondary | ICD-10-CM | POA: Diagnosis present

## 2024-06-01 ENCOUNTER — Ambulatory Visit: Payer: Self-pay | Admitting: Physician Assistant

## 2024-06-01 NOTE — Progress Notes (Signed)
 DARLYS BUIS                                          MRN: [id number]   06/01/2024   The [facility] Quality Team Specialist reviewed this patient medical record for the purposes of chart review for care gap closure. The following were reviewed: abstraction for care gap closure-glycemic status assessment.    [facility] Quality Team

## 2024-06-06 ENCOUNTER — Other Ambulatory Visit: Payer: Self-pay | Admitting: Internal Medicine

## 2024-06-08 ENCOUNTER — Ambulatory Visit: Admitting: Physician Assistant

## 2025-02-08 ENCOUNTER — Ambulatory Visit: Admitting: Physician Assistant

## 2025-02-19 ENCOUNTER — Ambulatory Visit
